# Patient Record
Sex: Female | Born: 1940 | Race: White | Hispanic: No | Marital: Married | Smoking: Never smoker
Health system: Southern US, Community
[De-identification: ages and names within clinical notes are randomized; demographics above are authoritative.]

## PROBLEM LIST (undated history)

## (undated) DIAGNOSIS — E7800 Pure hypercholesterolemia, unspecified: Secondary | ICD-10-CM

## (undated) DIAGNOSIS — C801 Malignant (primary) neoplasm, unspecified: Secondary | ICD-10-CM

## (undated) DIAGNOSIS — E119 Type 2 diabetes mellitus without complications: Secondary | ICD-10-CM

## (undated) DIAGNOSIS — I10 Essential (primary) hypertension: Secondary | ICD-10-CM

## (undated) DIAGNOSIS — N189 Chronic kidney disease, unspecified: Secondary | ICD-10-CM

## (undated) DIAGNOSIS — M109 Gout, unspecified: Secondary | ICD-10-CM

## (undated) HISTORY — PX: ABDOMINAL HYSTERECTOMY: SHX81

## (undated) HISTORY — PX: APPENDECTOMY: SHX54

## (undated) HISTORY — PX: OTHER SURGICAL HISTORY: SHX169

## (undated) HISTORY — PX: PARTIAL COLECTOMY: SHX5273

## (undated) HISTORY — PX: HERNIA REPAIR: SHX51

## (undated) HISTORY — PX: CHOLECYSTECTOMY: SHX55

---

## 1999-02-09 ENCOUNTER — Ambulatory Visit (HOSPITAL_BASED_OUTPATIENT_CLINIC_OR_DEPARTMENT_OTHER): Admission: RE | Admit: 1999-02-09 | Discharge: 1999-02-09 | Payer: Self-pay | Admitting: Orthopedic Surgery

## 2005-07-20 ENCOUNTER — Ambulatory Visit: Payer: Self-pay

## 2006-01-17 ENCOUNTER — Other Ambulatory Visit: Payer: Self-pay

## 2006-01-17 ENCOUNTER — Inpatient Hospital Stay: Payer: Self-pay | Admitting: Internal Medicine

## 2007-08-08 ENCOUNTER — Ambulatory Visit: Payer: Self-pay | Admitting: Family Medicine

## 2007-12-11 ENCOUNTER — Ambulatory Visit: Payer: Self-pay | Admitting: Gynecologic Oncology

## 2008-03-07 ENCOUNTER — Emergency Department: Payer: Self-pay | Admitting: Emergency Medicine

## 2008-03-08 ENCOUNTER — Emergency Department: Payer: Self-pay | Admitting: Emergency Medicine

## 2008-03-09 ENCOUNTER — Emergency Department: Payer: Self-pay | Admitting: Emergency Medicine

## 2008-03-09 ENCOUNTER — Other Ambulatory Visit: Payer: Self-pay

## 2008-09-05 ENCOUNTER — Ambulatory Visit: Payer: Self-pay | Admitting: Gynecologic Oncology

## 2008-09-16 ENCOUNTER — Ambulatory Visit: Payer: Self-pay | Admitting: Gynecologic Oncology

## 2010-08-06 ENCOUNTER — Emergency Department: Payer: Self-pay | Admitting: Internal Medicine

## 2010-08-13 ENCOUNTER — Emergency Department: Payer: Self-pay | Admitting: Emergency Medicine

## 2010-08-14 ENCOUNTER — Emergency Department: Payer: Self-pay | Admitting: Emergency Medicine

## 2010-09-21 ENCOUNTER — Ambulatory Visit: Payer: Self-pay | Admitting: Family Medicine

## 2011-05-08 ENCOUNTER — Emergency Department: Payer: Self-pay | Admitting: Emergency Medicine

## 2011-05-14 ENCOUNTER — Inpatient Hospital Stay: Payer: Self-pay | Admitting: Surgery

## 2011-05-16 LAB — CEA: CEA: 2.6 ng/mL (ref 0.0–4.7)

## 2011-05-23 LAB — PATHOLOGY REPORT

## 2011-07-20 ENCOUNTER — Emergency Department: Payer: Self-pay | Admitting: Unknown Physician Specialty

## 2012-08-22 ENCOUNTER — Ambulatory Visit: Payer: Self-pay | Admitting: Family Medicine

## 2014-08-13 ENCOUNTER — Other Ambulatory Visit: Payer: Self-pay | Admitting: Physician Assistant

## 2014-08-13 ENCOUNTER — Observation Stay: Payer: Self-pay | Admitting: Psychiatry

## 2014-08-13 LAB — COMPREHENSIVE METABOLIC PANEL
ALBUMIN: 2.9 g/dL — AB (ref 3.4–5.0)
ALK PHOS: 86 U/L
ANION GAP: 9 (ref 7–16)
BUN: 17 mg/dL (ref 7–18)
Bilirubin,Total: 0.3 mg/dL (ref 0.2–1.0)
CHLORIDE: 108 mmol/L — AB (ref 98–107)
CREATININE: 1.42 mg/dL — AB (ref 0.60–1.30)
Calcium, Total: 8.6 mg/dL (ref 8.5–10.1)
Co2: 24 mmol/L (ref 21–32)
EGFR (Non-African Amer.): 39 — ABNORMAL LOW
GFR CALC AF AMER: 47 — AB
Glucose: 181 mg/dL — ABNORMAL HIGH (ref 65–99)
Osmolality: 287 (ref 275–301)
POTASSIUM: 3.8 mmol/L (ref 3.5–5.1)
SGOT(AST): 23 U/L (ref 15–37)
SGPT (ALT): 13 U/L — ABNORMAL LOW
Sodium: 141 mmol/L (ref 136–145)
TOTAL PROTEIN: 7.1 g/dL (ref 6.4–8.2)

## 2014-08-13 LAB — CBC
HCT: 37.6 % (ref 35.0–47.0)
HGB: 11.8 g/dL — AB (ref 12.0–16.0)
MCH: 23.8 pg — AB (ref 26.0–34.0)
MCHC: 31.4 g/dL — ABNORMAL LOW (ref 32.0–36.0)
MCV: 76 fL — AB (ref 80–100)
Platelet: 291 10*3/uL (ref 150–440)
RBC: 4.96 10*6/uL (ref 3.80–5.20)
RDW: 15.9 % — ABNORMAL HIGH (ref 11.5–14.5)
WBC: 9.9 10*3/uL (ref 3.6–11.0)

## 2014-08-13 LAB — HEMOGLOBIN A1C: Hemoglobin A1C: 7.7 % — ABNORMAL HIGH (ref 4.2–6.3)

## 2014-08-13 LAB — CK TOTAL AND CKMB (NOT AT ARMC)
CK, TOTAL: 108 U/L (ref 26–192)
CK, Total: 91 U/L (ref 26–192)
CK, Total: 91 U/L (ref 26–192)
CK-MB: 1.2 ng/mL (ref 0.5–3.6)
CK-MB: 1.1 ng/mL (ref 0.5–3.6)
CK-MB: 1.5 ng/mL (ref 0.5–3.6)

## 2014-08-13 LAB — PROTIME-INR
INR: 0.9
PROTHROMBIN TIME: 12.4 s (ref 11.5–14.7)

## 2014-08-13 LAB — TROPONIN I

## 2014-08-13 LAB — APTT: Activated PTT: <23 secs — ABNORMAL LOW (ref 23.6–35.9)

## 2014-08-14 ENCOUNTER — Other Ambulatory Visit: Payer: Self-pay | Admitting: Physician Assistant

## 2014-08-14 DIAGNOSIS — R079 Chest pain, unspecified: Secondary | ICD-10-CM

## 2014-08-14 DIAGNOSIS — I200 Unstable angina: Secondary | ICD-10-CM

## 2014-08-14 DIAGNOSIS — N182 Chronic kidney disease, stage 2 (mild): Secondary | ICD-10-CM

## 2014-08-14 LAB — CBC WITH DIFFERENTIAL/PLATELET
BASOS PCT: 0.9 %
Basophil #: 0.1 10*3/uL (ref 0.0–0.1)
EOS ABS: 0.1 10*3/uL (ref 0.0–0.7)
Eosinophil %: 1.3 %
HCT: 33 % — AB (ref 35.0–47.0)
HGB: 10.3 g/dL — ABNORMAL LOW (ref 12.0–16.0)
Lymphocyte #: 2.8 10*3/uL (ref 1.0–3.6)
Lymphocyte %: 35.7 %
MCH: 24 pg — ABNORMAL LOW (ref 26.0–34.0)
MCHC: 31.3 g/dL — AB (ref 32.0–36.0)
MCV: 77 fL — AB (ref 80–100)
Monocyte #: 0.6 x10 3/mm (ref 0.2–0.9)
Monocyte %: 7.9 %
NEUTROS ABS: 4.3 10*3/uL (ref 1.4–6.5)
Neutrophil %: 54.2 %
PLATELETS: 238 10*3/uL (ref 150–440)
RBC: 4.3 10*6/uL (ref 3.80–5.20)
RDW: 15.9 % — AB (ref 11.5–14.5)
WBC: 7.9 10*3/uL (ref 3.6–11.0)

## 2014-08-14 LAB — HEPARIN LEVEL (UNFRACTIONATED)
Anti-Xa(Unfractionated): 0.28 IU/mL — ABNORMAL LOW (ref 0.30–0.70)
Anti-Xa(Unfractionated): <0.1 IU/mL — ABNORMAL LOW (ref 0.30–0.70)

## 2014-08-14 LAB — COMPREHENSIVE METABOLIC PANEL
ALBUMIN: 2.5 g/dL — AB (ref 3.4–5.0)
Alkaline Phosphatase: 71 U/L
Anion Gap: 7 (ref 7–16)
BUN: 17 mg/dL (ref 7–18)
Bilirubin,Total: 0.2 mg/dL (ref 0.2–1.0)
CALCIUM: 7.9 mg/dL — AB (ref 8.5–10.1)
CHLORIDE: 110 mmol/L — AB (ref 98–107)
CREATININE: 1.26 mg/dL (ref 0.60–1.30)
Co2: 25 mmol/L (ref 21–32)
EGFR (Non-African Amer.): 44 — ABNORMAL LOW
GFR CALC AF AMER: 54 — AB
Glucose: 156 mg/dL — ABNORMAL HIGH (ref 65–99)
OSMOLALITY: 288 (ref 275–301)
Potassium: 3.5 mmol/L (ref 3.5–5.1)
SGOT(AST): 23 U/L (ref 15–37)
SGPT (ALT): 14 U/L
Sodium: 142 mmol/L (ref 136–145)
TOTAL PROTEIN: 6 g/dL — AB (ref 6.4–8.2)

## 2014-08-17 LAB — WOUND AEROBIC CULTURE

## 2014-08-22 ENCOUNTER — Encounter: Payer: Self-pay | Admitting: Cardiovascular Disease

## 2014-12-27 NOTE — Discharge Summary (Signed)
PATIENT NAME:  Carly Cortez, Carly Cortez MR#:  [id number] DATE OF BIRTH:  [birth year]  DATE OF ADMISSION:  08/13/2014 DATE OF DISCHARGE:    PRIMARY CARE PHYSICIAN: Dr. Felipa Evener.   DISCHARGE DIAGNOSES: 1. Chest pain, 2. Angina.  3. Hypertension.  4. Diabetes.  5. Coronary artery disease. 6. Chronic kidney disease.  7. Anemia   CONDITION: Stable.   CODE STATUS: Full code.   HOME MEDICATIONS: Please refer to the medication reconciliation list.   DIET: Low-sodium, low-fat, low-cholesterol, ADA diet.   ACTIVITY: As tolerated.   FOLLOWUP CARE: Follow with PCP and the [facility] cardiologist within 1-2 weeks.   REASON FOR ADMISSION: Chest pain.   HOSPITAL COURSE: The patient is a 74 year old Caucasian female with a history of CAD, cardiomyopathy, hypertension, diabetes, hyperlipidemia, and stable angina, who presented in the ED due to chest pain. For detailed history and physical examination, please refer to the admission note dictated by Dr. Ether Griffins ASSESSMENT AND PLAN: 1. For chest pain and history of coronary artery disease, the patient has been treated with aspirin, Lipitor, and Coreg. The patient's troponin level has been negative for 3 sets. Cardiology consult suggested a stress test today, which is normal. The patient was on heparin drip and according to cardiologist, if the patient's stress test is normal, the patient can be discharged today.  2. For chronic kidney disease, the patient's creatinine decreased to normal range, which is stable.  3. Diabetes. He has been treated with sliding scale and Levemir. Blood sugar is controlled.  4. Hypertension has been controlled with hypertension medication.  5. The patient has no chest pain, the patient's vital signs are stable and physical examination is unremarkable.   CONDITION: She is clinically stable.   DISPOSITION: She will be discharged to home today if Dr. Fletcher Anon agrees.   I discussed the patient's discharge plan with the patient, the patient's  husband, nurse,  and case manager and also, the cardiologist.   TIME SPENT: About 43 minutes.    ____________________________ Demetrios Loll, MD qc:mw D: 08/14/2014 14:21:34 ET T: 08/14/2014 15:03:25 ET JOB#: [id number]  cc: Demetrios Loll, MD, <Dictator> Demetrios Loll MD ELECTRONICALLY SIGNED 08/14/2014 16:06

## 2014-12-27 NOTE — Consult Note (Signed)
General Aspect Primary Cardiologist: [facility] ___________________  74 year old female with history of CAD s/p multiple stenting in 2010 after suffering post op MI, HTN, HLD, vulvar CA, colon CA, lichen sclerosis, spasmodic dysphonia, and CKD stage II who presented to [facility] on 08/13/2014 with return of her intermittent chest pain and and SOB. Troponin negative x 3.  ___________________  PMH: 1. CAD s/p multiple stenting in 2010 after suffering post op MI (proximal/mid LAD & mid/distal RCA) 2. HTN 3. HLD 4. Vulvar CA 5. Colon CA 6. 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   Patient with known history of CAD as above dating back to 2010. She recently underwent a nuclear stress test at [facility] this past summer 2/2 exertional chest pain and SOB that was abnormal prompting her primary cardiologist there to proceed with a cardiac cath. She underwent cath on 05/23/2014 that showed in-stent restenosis of the mRCA of 40-50%, in-stent restenosis of the dRCA of 10-20%, widely patent pLAD and mLAD stents. The study also revealed D1 was a small vessel that was jailed by the pLAD stent. Her abnormal stress test results did not correlate with with the angiographic appearance of  the widely patent  LAD. However, it could be due to the already jailed D1 which appears stable. Medical therapy was recommeded. Follow up echo on 06/04/2014 showed an EF of 60-65%, GR1DD, and mildly dialted left atrium. She was started on Imdur 30 mg daily which did help some with her symptoms. She last followed up with her [facility] cardiologist on 08/07/2014 with the above continued complaints, as well as dizziness. Her Imdur was titraited at that time without further intervention. She has been having to take SL NTG, as well as NTG paste at night several times  per week in plus the Imdur to aid in her symptoms.   She presented to [facility] on 08/13/2014 with intermittent, severe left sided chest pain that radiated towards her back. Pain was initially rated a 9/10 described as sharp and tight. It was associated with diaphoresis, SOB, dizziness, and presyncope. 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 Troponin now negative x 3. She is currently ***   Physical Exam:  GEN well developed, well nourished, no acute distress, obese   HEENT PERRL, hearing intact to voice, moist oral mucosa   NECK supple   RESP normal resp effort  clear BS   CARD Regular rate and rhythm  Normal, S1, S2  No murmur   ABD denies tenderness  no hernia  soft   EXTR negative edema   SKIN normal to palpation   NEURO cranial nerves intact   PSYCH alert, A+O to time, place, person, good insight   Review of Systems:  General: Fatigue  Weakness   Skin: No Complaints   ENT: No Complaints   Eyes: No Complaints   Neck: No Complaints   Respiratory: Short of breath   Cardiovascular: Chest pain or discomfort  Tightness   Gastrointestinal: Nausea   Genitourinary: No Complaints   Vascular: No Complaints   Musculoskeletal: No Complaints   Neurologic: Dizzness   Hematologic: No Complaints   Endocrine: No Complaints   Psychiatric: No Complaints   Review of Systems: All other systems were reviewed  and found to be negative   Medications/Allergies Reviewed Medications/Allergies reviewed   Family & Social History:  Family and Social History:  Family History Hypertension  Diabetes Mellitus   Social History negative tobacco, negative ETOH, negative Illicit drugs   Place of Living Home     Osteoporosis:    Kidney Infection:    Hemorrhoids:    anemia-iron def. anemia:    Hypercholesterolemia:    htn:    diabetes I:    GERD:    ingunal hernia repair:    Hysterectomy -  Partial:    cardiac stents: x3   Knee Surgery:    Bladder Surgery:   Home Medications: Medication Instructions Status  aspirin 81 mg oral tablet 1  orally every other day Active  Lantus Solostar Pen 100 units/mL subcutaneous solution 58 unit(s) subcutaneous once a day Active  Norco 325 mg-5 mg oral tablet 2 tab(s) orally every 4 hours, As Needed - for Pain Active  pantoprazole 40 mg oral delayed release tablet 1 tab(s) orally once a day Active  carvedilol 12.5 mg oral tablet 1 tab(s) orally 2 times a day Active  clobetasol topical 0.05% topical cream Apply topically to affected area once a day Active  Nitrostat 0.4 mg sublingual tablet 1 tab(s) sublingual every 5 minutes, As Needed Active  cetirizine 5 mg oral tablet 2 tab(s) orally once a day Active  Imdur 60 mg oral tablet, extended release 1 tab(s) orally once a day (in the morning) Active   Lab Results:  Hepatic:  10-Dec-15 00:51   Bilirubin, Total 0.2  Alkaline Phosphatase 71 (46-116 NOTE: New Reference Range 03/25/14)  SGPT (ALT) 14 (14-63 NOTE: New Reference Range 03/25/14)  SGOT (AST) 23  Total Protein, Serum  6.0  Albumin, Serum  2.5  Routine Chem:  10-Dec-15 00:51   Glucose, Serum  156  BUN 17  Creatinine (comp) 1.26  Sodium, Serum 142  Potassium, Serum 3.5  Chloride, Serum  110  CO2, Serum 25  Calcium (Total), Serum  7.9  Osmolality (calc) 288  eGFR (African American)  54  eGFR (Non-African American)  44 (eGFR values <38m/min/1.73 m2 may be an indication of chronic kidney disease (CKD). Calculated eGFR, using the MRDR Study equation, is useful in  patients with stable renal function. The eGFR calculation will not be reliable in acutely ill patients when serum creatinine is changing rapidly. It is not useful in patients on dialysis. The eGFR calculation may not be applicable to patients at the low and high extremes of body sizes, pregnant women, and vegetarians.)  Anion Gap 7  Cardiac:  09-Dec-15  11:24   Troponin I < 0.02 (0.00-0.05 0.05 ng/mL or less: NEGATIVE  Repeat testing in 3-6 hrs  if clinically indicated. >0.05 ng/mL: POTENTIAL  MYOCARDIAL INJURY. Repeat  testing in 3-6 hrs if  clinically indicated. NOTE: An increase or decrease  of 30% or more on serial  testing suggests a  clinically important change)    15:31   Troponin I < 0.02 (0.00-0.05 0.05 ng/mL or less: NEGATIVE  Repeat testing in 3-6 hrs  if clinically indicated. >0.05 ng/mL: POTENTIAL  MYOCARDIAL INJURY. Repeat  testing in 3-6 hrs if  clinically indicated. NOTE: An increase or decrease  of 30% or more on serial  testing suggests a  clinically important change)    20:11   Troponin I < 0.02 (0.00-0.05 0.05 ng/mL or less: NEGATIVE  Repeat testing in 3-6 hrs  if clinically indicated. >0.05 ng/mL: POTENTIAL  MYOCARDIAL INJURY.  Repeat  testing in 3-6 hrs if  clinically indicated. NOTE: An increase or decrease  of 30% or more on serial  testing suggests a  clinically important change)  Routine Hem:  10-Dec-15 00:51   WBC (CBC) 7.9  RBC (CBC) 4.30  Hemoglobin (CBC)  10.3  Hematocrit (CBC)  33.0  Platelet Count (CBC) 238  MCV  77  MCH  24.0  MCHC  31.3  RDW  15.9  Neutrophil % 54.2  Lymphocyte % 35.7  Monocyte % 7.9  Eosinophil % 1.3  Basophil % 0.9  Neutrophil # 4.3  Lymphocyte # 2.8  Monocyte # 0.6  Eosinophil # 0.1  Basophil # 0.1 (Result(s) reported on 14 Aug 2014 at 01:23AM.)   EKG:  EKG Interp. by me   Interpretation NSR, incomplete LBBB, inferolateral st depression   Radiology Results: XRay:    09-Dec-15 12:38, Chest Portable Single View  Chest Portable Single View   REASON FOR EXAM:    Chest Pain  COMMENTS:       PROCEDURE: DXR - DXR PORTABLE CHEST SINGLE VIEW  - Aug 13 2014 12:38PM     CLINICAL DATA:  Chest pain.  History of hypertension, diabetes.    EXAM:  PORTABLE CHEST - 1 VIEW    COMPARISON:  03/09/2008    FINDINGS:  Heart is normal size. Mediastinal  contours are within normal limits.  Minimal bibasilar atelectasis. No effusions. No acute bony  abnormality.     IMPRESSION:  Bibasilar atelectasis.      Electronically Signed    By: Rolm Baptise M.D.    On: 08/13/2014 13:10         Verified By: Raelyn Number, M.D.,    Daypro: Hives  Codeine: None  Cipro: None  Lodine: Unknown  Colchicine: Anaphylaxis  Vital Signs/Nurse's Notes: **Vital Signs.:   10-Dec-15 06:16  Vital Signs Type Routine  Temperature Temperature (F) 98.1  Celsius 36.7  Temperature Source oral  Pulse Pulse 75  Respirations Respirations 20  Systolic BP Systolic BP 545  Diastolic BP (mmHg) Diastolic BP (mmHg) 80  Mean BP 104  Pulse Ox % Pulse Ox % 92  Pulse Ox Activity Level  At rest  Oxygen Delivery Room Air/ 21 %    Impression 74 year old female with history of CAD s/p multiple stenting in 2010 after suffering post op MI, HTN, HLD, vulvar CA, colon CA, lichen sclerosis, spasmodic dysphonia, and CKD stage II who presented to [facility] on 08/13/2014 with return of her intermittent chest pain and and SOB. Troponin negative x 3. She was admitted with unstable angina.   1. Unstable angina: -Troponin negative x 3 -EKG with known incomplete LBBB and new inferolateral st depression -Lexiscan Myoview to evaluate for high risk ischemia -Should she require cardiac cath, she needs steroids pre-cath -Last EF 60-65% on 06/04/2014 -Heparin gtt -Aspirin 81 mg -Coreg 12.5 mg bid -Imdur 60 mg   2. CKD stage II: -SCr is improved quite a bit from years prior when there was discussion of possible HD -Monitor  3. IDDM: -Per IM  4. HTN: -Controlled -Continue medications as above  5. Anemia: -Per IM   Electronic Signatures for Addendum Section:  Kathlyn Sacramento (MD) (Signed Addendum 10-Dec-15 16:24)  The patient was seen and examined. Agree with the above. She has chronic chest pain. Recent cath at [facility] in September showed patent LAD stent with mild restenosis. No  other obstructive disease. She presented with prolonged episode of chest pain. Negative  TnI and no ischemic ECG changes. Lexiscan Myoview was normal.  No further work up is recommended. ok to discharge home. Follow up with cardiologist in few weeks.   Electronic Signatures: Kathlyn Sacramento (MD)  (Signed 10-Dec-15 16:24)  Co-Signer: General Aspect/Present Illness, Family & Social History, Past Medical History, Home Medications, Labs, Radiology, Allergies, Vital Signs/Nurse's Notes Christell Faith M (PA-C)  (Signed 10-Dec-15 09:22)  Authored: General Aspect/Present Illness, History and Physical Exam, Review of System, Family & Social History, Past Medical History, Home Medications, Labs, EKG , Radiology, Allergies, Vital Signs/Nurse's Notes, Impression/Plan   Last Updated: 10-Dec-15 16:24 by Kathlyn Sacramento (MD)

## 2014-12-31 NOTE — H&P (Signed)
PATIENT NAME:  Carly Cortez, Carly Cortez MR#:  [id number] DATE OF BIRTH:  27-May-1941  DATE OF ADMISSION:  08/13/2014  PRIMARY CARE PHYSICIAN: Chaney Malling, MD   HISTORY OF PRESENT ILLNESS: The patient is a 74 year old Caucasian female with a past medical history significant for a history of coronary artery disease, status post myocardial infarction in 2010, history of cardiomyopathy with a known ejection fraction, diabetes, hypertension, hyperlipidemia, and also stable angina,  who presents to the hospital with complaints of chest pain. Per the patient, she has been having stable angina for a while now. She recently a cardiac catheterization done by Dr. Sabra Heck in September 2015. She was told that she had in-stent stenosis of approximately 40%; however, no new stents were placed, and the patient was discharged on advanced doses of Imdur. Today, however, she started having severe left-sided chest pain. Pain was described as sharp, tightness, and very intense pain, 9 out of 10 by intensity. It was intermittent and initially lasted for an hour and 15 minutes. It was radiating to the back, accompanied by lightheadedness as well as dizziness, also feeling like room was spinning around and feeling presyncopal. Also, she broke into a sweat and was very short of breath. There was no significant change in pain with deep breathing. The patient, however, was not able to walk. She sat down and even laid down, and her pain eased off. However, total pain lasted approximately 45 minutes. When EMS arrived, she was brought to Emergency Room for further evaluation.   In the Emergency Room, her EKG performed did not show any significant changes, except for LVH, as well as incomplete left bundle branch block. Unfortunately no old EKGs are available to compare with. Her cardiac enzymes were also negative on her first testing. Hospital services were contacted for admission.   PAST MEDICAL HISTORY: Significant for history of chronic renal  failure. Apparently, the patient was in acute renal failure in the past, almost required hemodialysis. Now, she has been followed by a nephrologist intermittently, with labs every 6 months. History of coronary artery disease, status post MI in 2010, status post cardiac catheterization in September 2015 by Dr. Sabra Heck at [facility], where she was told that she has a 40% in-stent stenosis, but no stents were placed. Also, a history of cardiomyopathy, according to the patient, with unknown ejection fraction, diabetes mellitus type 2 insulin-dependent, hypertension, hyperlipidemia, gastroesophageal reflux disease, appendectomy, partial colectomy due to colon cancer, gallbladder surgery, 2 hernia repairs, vulvar precancerous condition.   ALLERGIES: MULTIPLE, INCLUDING COLCHICINE, DAYPRO, CIPROFLOXACIN, CODEINE AS WELL AS LODINE.   FAMILY HISTORY: The patient's mother had hypertension as well as diabetes mellitus. The patient's maternal grandmother had kidney disease.   SOCIAL HISTORY: The patient is married and has 3 children who live close by. She lives with her husband. No smoking. No alcohol abuse. She used to work in a [employer].   REVIEW OF SYSTEMS:  CONSTITUTIONAL: Positive for chest pains, arrhythmias, feeling presyncopal, and intermittent diarrhea which seems to be chronic since her gallbladder surgery. Also intermittent dyspnea on exertion, intermittent chest pains, admits of watery eyes intermittently, and frequency of urination. She has to urinate 2 or 3 times at night. Denies any fevers, chills, fatigue, weakness, weight loss or gain.  EYES: Denies any blurry vision, double vision, glaucoma, or cataracts.  EARS, NOSE, AND THROAT: Denies any tinnitus, allergies, epistaxis, sinus pain, dentures, or difficulty swallowing. RESPIRATORY: Denies any cough, wheezes, asthma, or COPD.  CARDIOVASCULAR: Denies any orthopnea, edema,  or palpitations.  GASTROINTESTINAL: Denies any nausea, vomiting,  hematemesis, rectal bleeding, change in bowel habits.  GENITOURINARY: Denies dysuria, hematuria, incontinence.  ENDOCRINE: Denies any polydipsia, nocturia, thyroid problems, heat or cold intolerance or thirst.  HEMATOLOGIC: Denies any anemia, easy bruising or bleeding, swollen glands.  SKIN: Denies any acne, rashes, or change in moles.  MUSCULOSKELETAL: Denies arthritis, cramps, swelling.  NEUROLOGIC: Denies numbness, epilepsy or tremor.  PSYCHIATRIC: Denies anxiety, insomnia, depression.   PHYSICAL EXAMINATION:  VITAL SIGNS: On arrival to the hospital, the patient's temperature was 98, pulse was 76, respiration was 18, blood pressure 126/90, saturation 98% on room air.  GENERAL: This is a well-developed, well-nourished, obese, pale Caucasian female sitting on the stretcher.  HEENT: Her pupils are equal and reactive to light.  Extraocular muscles are intact. No icterus or conjunctivitis. Has normal hearing. No pharyngeal erythema. Mucosa is dry.  NECK: No masses. Supple, nontender. Thyroid not enlarged. No adenopathy. No JVD or carotid bruits bilaterally. Full range of motion.  LUNGS: The patient does have somewhat diminished breath sounds noted, but no wheezing. No labored inspirations, increased effort, dullness to percussion or overt respiratory distress.  CARDIOVASCULAR: S1, S2 appreciated. Rhythm was regular. PMI not lateralized. Chest is nontender to palpation.  Pedal pulses 1+. No lower extremity edema, calf tenderness, or cyanosis was noted.  ABDOMEN: Soft, nontender. No significant hepatosplenomegaly or masses were noted. The patient does have some midline below the umbilical area wounds, which had not healed yet; ulcerations.  RECTAL: Deferred.  MUSCLE STRENGTH: Able to move all extremities. No cyanosis, degenerative joint disease or kyphosis. Gait not tested.  SKIN: Did not reveal any rashes, lesions, erythema, nodularity, or induration. It was warm and dry to palpation. The patient  does have  healing wound/healing ulcer in the abdominal wall area.  NEUROLOGIC: Cranial nerves grossly intact. Sensory is intact. No dysarthria or aphasia. The patient is alert, oriented to time, person and place, cooperative. Memory is good. No signs of confusion, agitation, or depression were noted.   LABORATORY DATA: BMP showed a glucose of 181, creatinine 1.42; otherwise BMP was unremarkable. Albumin level was 2.9; otherwise liver enzymes were unremarkable. Cardiac enzymes were negative. CBC: White blood cell count was 9.9, hemoglobin 11.8, platelet count was 291,000 with low MCV of 76. Coagulation Panel: Pro-time 12.4, INR 0.9, and activated PTT was less than 23.0.   ELECTROCARDIOGRAM: Showed normal sinus rhythm at 78 beats per minute, incomplete left bundle branch block, LVH with repolarization abnormalities, but no acute ST-T changes were noted. Q-wave in lead III was noted. Nonspecific ST-T changes. ST depressions in her lateral leads.   CHEST X-RAY: A portable single view, 08/13/2014, revealed the previous atelectasis.   ASSESSMENT AND PLAN:  1.  Unstable angina. Admit the patient to the medical floor. Continue her on Coreg, aspirin, heparin, nitroglycerin as well as morphine as needed. Will get cardiology consultation done and likely a stress test in the morning.  2.  Diabetes mellitus type 2 with nephropathy. We will continue Lantus at lower doses. While she is in the hospital, continue sliding scale insulin and get hemoglobin A1c checked.  3.  Hypertension. We will continue her medications. Her blood pressure seemed to be well controlled.  4.  Renal failure, chronic. Will start the patient on low rate IV fluids as the patient seemed to be dehydrated clinically. We will follow the patient's creatinine in the morning.  5.  Anemia. Check guaiac. The patient has low MCV.   TIME SPENT: 50  minutes.    ____________________________ Theodoro Grist, MD rv:MT D: 08/13/2014 15:19:42  ET T: 08/13/2014 16:13:42 ET JOB#: [id number]  cc: Theodoro Grist, MD, <Dictator> Dr. Chaney Malling Ankith Edmonston MD ELECTRONICALLY SIGNED 09/16/2014 18:14

## 2015-02-11 ENCOUNTER — Encounter: Payer: Self-pay | Admitting: *Deleted

## 2015-02-11 ENCOUNTER — Emergency Department
Admission: EM | Admit: 2015-02-11 | Discharge: 2015-02-11 | Disposition: A | Discharge status: Home / Self Care | Payer: Medicare Other | Attending: Emergency Medicine | Admitting: Emergency Medicine

## 2015-02-11 DIAGNOSIS — R040 Epistaxis: Secondary | ICD-10-CM | POA: Diagnosis not present

## 2015-02-11 DIAGNOSIS — E119 Type 2 diabetes mellitus without complications: Secondary | ICD-10-CM | POA: Insufficient documentation

## 2015-02-11 DIAGNOSIS — I10 Essential (primary) hypertension: Secondary | ICD-10-CM | POA: Diagnosis not present

## 2015-02-11 HISTORY — DX: Type 2 diabetes mellitus without complications: E11.9

## 2015-02-11 HISTORY — DX: Pure hypercholesterolemia, unspecified: E78.00

## 2015-02-11 HISTORY — DX: Essential (primary) hypertension: I10

## 2015-02-11 HISTORY — DX: Malignant (primary) neoplasm, unspecified: C80.1

## 2015-02-11 LAB — GLUCOSE, CAPILLARY: GLUCOSE-CAPILLARY: 152 mg/dL — AB (ref 65–99)

## 2015-02-11 MED ORDER — CARVEDILOL 25 MG PO TABS
ORAL_TABLET | ORAL | Status: AC
  Administered 2015-02-11: 12.5 mg via ORAL
  Filled 2015-02-11: qty 1

## 2015-02-11 MED ORDER — OXYMETAZOLINE HCL 0.05 % NA SOLN
1.0000 | Freq: Once | NASAL | Status: AC
  Administered 2015-02-11: 1 via NASAL

## 2015-02-11 MED ORDER — CARVEDILOL 12.5 MG PO TABS
12.5000 mg | ORAL_TABLET | Freq: Two times a day (BID) | ORAL | Status: DC
  Administered 2015-02-11: 12.5 mg via ORAL

## 2015-02-11 MED ORDER — OXYMETAZOLINE HCL 0.05 % NA SOLN
NASAL | Status: AC
  Administered 2015-02-11: 1 via NASAL
  Filled 2015-02-11: qty 15

## 2015-02-11 NOTE — Discharge Instructions (Signed)
Return to the emergency department for any new or worsening bleeding including any dizziness, chest pain, passing out, or bleeding that does not stop with clamp pressure for 10 minutes.  Nosebleed A nosebleed can be caused by many things, including:  Getting hit hard in the nose.  Infections.  Dry nose.  Colds.  Medicines. Your doctor may do lab testing if you get nosebleeds a lot and the cause is not known. HOME CARE   If your nose was packed with material, keep it there until your doctor takes it out. Put the pack back in your nose if the pack falls out.  Do not blow your nose for 12 hours after the nosebleed.  Sit up and bend forward if your nose starts bleeding again. Pinch the front half of your nose nonstop for 20 minutes.  Put petroleum jelly inside your nose every morning if you have a dry nose.  Use a humidifier to make the air less dry.  Do not take aspirin.  Try not to strain, lift, or bend at the waist for many days after the nosebleed. GET HELP RIGHT AWAY IF:   Nosebleeds keep happening and are hard to stop or control.  You have bleeding or bruises that are not normal on other parts of the body.  You have a fever.  The nosebleeds get worse.  You get lightheaded, feel faint, sweaty, or throw up (vomit) blood. MAKE SURE YOU:   Understand these instructions.  Will watch your condition.  Will get help right away if you are not doing well or get worse. Document Released: 05/31/2008 Document Revised: 11/14/2011 Document Reviewed: 05/31/2008 [facility] Patient Information 2015 [place], [place]. This information is not intended to replace advice given to you by your health care provider. Make sure you discuss any questions you have with your health care provider.

## 2015-02-11 NOTE — ED Notes (Signed)
Pt states she had a nosebleed this morning for about 45 minutes that subsides. The nosebleed started again around 7pm tonight (right nare only). Pt with small amount of bleeding right nare in triage, clamp placed

## 2015-02-11 NOTE — ED Provider Notes (Signed)
[facility] Emergency Department Provider Note   ____________________________________________  Time seen: 9:45 PM I have reviewed the triage vital signs and the triage nursing note.  HISTORY  Chief Complaint Epistaxis   Historian Patient  HPI Carly Cortez is a 74 y.o. female is having a nosebleed from the right naris. She woke up this morning without 45 minutes of nosebleed. The husband does not feel like it was much blood loss in total. This evening it did start again and so they came in for evaluation. This afternoon the daughter had made an appointment with the ENT Dr. Richardson Landry for Friday. Patient has had nosebleed in the past however at that point in time she was on Plavix, and she is not on that anymore. She does have high blood pressure. She has not taken her evening dose of carvedilol yet and she is not sure the dose. Symptoms are moderate. She did have some dizziness upon arrival but no syncope. There's been no chest pain or shortness of breath.   Past Medical History  Diagnosis Date  . Diabetes mellitus without complication   . Hypertension   . High cholesterol   . Cancer     There are no active problems to display for this patient.   Past Surgical History  Procedure Laterality Date  . Cardiac stents    . Appendectomy    . Cholecystectomy    . Partial colectomy    . Hernia repair      No current outpatient prescriptions on file.  Allergies Review of patient's allergies indicates not on file.  No family history on file.  Social History History  Substance Use Topics  . Smoking status: Never Smoker   . Smokeless tobacco: Not on file  . Alcohol Use: No    Review of Systems  Constitutional: Negative for fever. Eyes: Negative for visual changes. ENT: Negative for sore throat. Cardiovascular: Negative for chest pain. Respiratory: Negative for shortness of breath. Gastrointestinal: Negative for abdominal pain, vomiting and  diarrhea. Genitourinary: Negative for dysuria. Musculoskeletal: Negative for back pain. Skin: Negative for rash. Neurological: Negative for headaches, focal weakness or numbness.  ____________________________________________   PHYSICAL EXAM:  VITAL SIGNS: ED Triage Vitals  Enc Vitals Group     BP 02/11/15 2016 163/88 mmHg     Pulse Rate 02/11/15 2016 86     Resp 02/11/15 2121 16     Temp 02/11/15 2016 97.9 F (36.6 C)     Temp Source 02/11/15 2016 Oral     SpO2 02/11/15 2016 94 %     Weight 02/11/15 2016 204 lb (92.534 kg)     Height 02/11/15 2016 5\' 6"  (1.676 m)     Head Cir --      Peak Flow --      Pain Score 02/11/15 2121 0     Pain Loc --      Pain Edu? --      Excl. in [place]? --      Constitutional: Alert and oriented. Well appearing and in no distress. Eyes: Conjunctivae are normal. PERRL. Normal extraocular movements. ENT   Head: Normocephalic and atraumatic.   Nose: Small amount of dried blood at the right nares entrance. Hyperemia of all mucosa of the right narrow with no focal blood clot or scab seen. No active bleeding. Left nasal mucosa also hyperemic.   Mouth/Throat: Mucous membranes are moist.   Neck: No stridor. Cardiovascular: Normal rate, regular rhythm.  No murmurs, rubs, or gallops.  Respiratory: Normal respiratory effort without tachypnea nor retractions. Breath sounds are clear and equal bilaterally. No wheezes/rales/rhonchi. Gastrointestinal:  Genitourinary/rectal: Musculoskeletal: Nontender with normal range of motion in all extremities. No joint effusions.  No lower extremity tenderness nor edema. Neurologic:  Normal speech and language. No gross focal neurologic deficits are appreciated. Skin:  Skin is warm, dry and intact. No rash noted. Psychiatric: Mood and affect are normal. Speech and behavior are normal. Patient exhibits appropriate insight and judgment.  ____________________________________________    EKG  None ____________________________________________  LABS (pertinent positives/negatives)  None  ____________________________________________  RADIOLOGY Radiologist results reviewed  None __________________________________________  PROCEDURES  Procedure(s) performed: None Critical Care performed: None  ____________________________________________   ED COURSE / ASSESSMENT AND PLAN  Pertinent labs & imaging results that were available during my care of the patient were reviewed by me and considered in my medical decision making (see chart for details).  After pressure was applied to the nose, visualization was undertaken with an otoscope and there is no active bleeding, no visualized clots or area to be cauterized. I did instill Afrin 1 spray to both sides of the naris. In terms of her dizziness, there is no hypotension. In fact her blood pressure slightly elevated and she was given her evening time dose of carvedilol. We discussed whether or not to check blood counts with respect to anemia, and the family did not feel like there is much blood lost and chose not to do blood work this evening. Patient is no longer feeling dizzy after the acute bleeding was stopped. She has an appointment made with Dr. Richardson Landry on Friday.   Patient / Family / Caregiver informed of clinical course, understand medical decision-making process, and agree with plan.    ___________________________________________   FINAL CLINICAL IMPRESSION(S) / ED DIAGNOSES   Final diagnoses:  Epistaxis, recurrent      Lisa Roca, MD 02/11/15 2222

## 2015-05-14 ENCOUNTER — Ambulatory Visit: Payer: Medicare Other

## 2015-05-14 ENCOUNTER — Encounter: Payer: Self-pay | Admitting: Emergency Medicine

## 2015-05-14 ENCOUNTER — Ambulatory Visit
Admission: EM | Admit: 2015-05-14 | Discharge: 2015-05-14 | Disposition: A | Discharge status: Home / Self Care | Payer: Medicare Other | Attending: Family Medicine | Admitting: Family Medicine

## 2015-05-14 DIAGNOSIS — J40 Bronchitis, not specified as acute or chronic: Secondary | ICD-10-CM | POA: Diagnosis not present

## 2015-05-14 DIAGNOSIS — J0100 Acute maxillary sinusitis, unspecified: Secondary | ICD-10-CM | POA: Diagnosis not present

## 2015-05-14 HISTORY — DX: Gout, unspecified: M10.9

## 2015-05-14 HISTORY — DX: Chronic kidney disease, unspecified: N18.9

## 2015-05-14 MED ORDER — AZITHROMYCIN 250 MG PO TABS: ORAL_TABLET | ORAL | Status: DC

## 2015-05-14 MED ORDER — BENZONATATE 100 MG PO CAPS: 100.0000 mg | ORAL_CAPSULE | Freq: Three times a day (TID) | ORAL | Status: DC | PRN

## 2015-05-14 NOTE — ED Provider Notes (Signed)
[facility] Emergency Department Provider Note  ____________________________________________  Time seen: Approximately 8:49 AM  I have reviewed the triage vital signs and the nursing notes.   HISTORY  Chief Complaint Cough   HPI Carly Cortez is a 74 y.o. female  presents with complaints of runny nose, cough and congestion 2 weeks. Patient reports that started off with a runny nose and then gradually progressed. Reports last few days with increased nasal drainage throughout day and especially at night. States cough is dry and unable to cough up phlegm. Denies chest pain or shortness of breath. Denies wheezing. Reports continues to eat and drink well. States felt warm a few days ago but denies known fever. Denies pain.  Reports husband with similar symptoms. States granddaughter's boyfriend with similar symptoms prior to onset of patient's symptoms.  Denies chest pain, shortness of breath, abdominal pain, neck pain, back pain, decreased appetite or intake. Reports feels that she has a sinus infection.   Past Medical History  Diagnosis Date  . Diabetes mellitus without complication   . Hypertension   . High cholesterol   . Cancer   . Chronic kidney disease   . Gout     There are no active problems to display for this patient.   Past Surgical History  Procedure Laterality Date  . Cardiac stents    . Appendectomy    . Cholecystectomy    . Partial colectomy    . Hernia repair    . Abdominal hysterectomy      Current Outpatient Rx  Name  Route  Sig  Dispense  Refill  . aspirin 81 MG tablet   Oral   Take 81 mg by mouth daily.         . carvedilol (COREG) 25 MG tablet   Oral   Take 25 mg by mouth 2 (two) times daily with a meal.         . clobetasol cream (TEMOVATE) 0.05 %   Topical   Apply 1 application topically daily.         . clobetasol ointment (TEMOVATE) 0.05 %   Topical   Apply 1 application topically 2 (two) times daily.          [name] doxepin (SINEQUAN) 25 MG capsule   Oral   Take 25 mg by mouth at bedtime.         . insulin glargine (LANTUS) 100 UNIT/ML injection   Subcutaneous   Inject 60 Units into the skin every morning.         . insulin glargine (LANTUS) 100 UNIT/ML injection   Subcutaneous   Inject 32 Units into the skin at bedtime.         . isosorbide mononitrate (IMDUR) 120 MG 24 hr tablet   Oral   Take 120 mg by mouth daily.         [name] lisinopril (PRINIVIL,ZESTRIL) 40 MG tablet   Oral   Take 40 mg by mouth daily.         . nitroGLYCERIN (NITROSTAT) 0.4 MG SL tablet   Sublingual   Place 0.4 mg under the tongue every 5 (five) minutes as needed for chest pain.         . pantoprazole (PROTONIX) 40 MG tablet   Oral   Take 40 mg by mouth daily.         . simvastatin (ZOCOR) 40 MG tablet   Oral   Take 40 mg by mouth daily.  Allergies Contrast media; Colchicine; Etodolac; Indomethacin; and Oxaprozin  History reviewed. No pertinent family history.  Social History Social History  Substance Use Topics  . Smoking status: Never Smoker   . Smokeless tobacco: None  . Alcohol Use: No    Review of Systems Constitutional: Reports felt warm, but denies known fever.  Eyes: No visual changes. ENT: Reports runny nose, nasal congestion, intermittent sore throat.  Cardiovascular: Denies chest pain. Respiratory: Denies shortness of breath. Reports dry cough.  Gastrointestinal: No abdominal pain.  No nausea, no vomiting.  No diarrhea.  No constipation. Genitourinary: Negative for dysuria. Musculoskeletal: Negative for back pain. Skin: Negative for rash. Neurological: Negative for headaches, focal weakness or numbness.  10-point ROS otherwise negative.  ____________________________________________   PHYSICAL EXAM:  VITAL SIGNS: ED Triage Vitals  Enc Vitals Group     BP 05/14/15 0818 107/63 mmHg     Pulse Rate 05/14/15 0818 80     Resp 05/14/15 0818 16      Temp 05/14/15 0818 96.9 F (36.1 C)     Temp Source 05/14/15 0818 Tympanic     SpO2 05/14/15 0818 98 %     Weight 05/14/15 0818 200 lb (90.719 kg)     Height 05/14/15 0818 5\' 6"  (1.676 m)     Head Cir --      Peak Flow --      Pain Score --      Pain Loc --      Pain Edu? --      Excl. in [place]? --     Constitutional: Alert and oriented. Well appearing and in no acute distress. Eyes: Conjunctivae are normal. PERRL. EOMI. Head: Atraumatic. Mild maxillary sinus TTP, no frontal sinus TTP.    Ears: no erythema, normal TMs bilaterally.   Nose: nasal congestion, bilateral nasal turbinate edema, bilateral nares patent.   Mouth/Throat: Mucous membranes are moist.  Oropharynx non-erythematous. Neck: No stridor.  No cervical spine tenderness to palpation. Hematological/Lymphatic/Immunilogical: No cervical lymphadenopathy. Cardiovascular: Normal rate, regular rhythm. Grossly normal heart sounds.  Good peripheral circulation. Respiratory: Normal respiratory effort.  No retractions. Lungs CTAB. Dry intermittent cough in room.  Gastrointestinal: Soft and nontender. No distention. Normal Bowel sounds.  No abdominal bruits. No CVA tenderness. Musculoskeletal: No lower or upper extremity tenderness nor edema.  No joint effusions. Bilateral pedal pulses equal and easily palpated.  Neurologic:  Normal speech and language. No gross focal neurologic deficits are appreciated. No gait instability. Skin:  Skin is warm, dry and intact. No rash noted. Psychiatric: Mood and affect are normal. Speech and behavior are normal.  ____________________________________________   LABS (all labs ordered are listed, but only abnormal results are displayed)  Labs Reviewed - No data to display ____________________________________________  RADIOLOGY  EXAM: CHEST 2 VIEW  COMPARISON: August 13, 2014.  FINDINGS: The heart size and mediastinal contours are within normal limits. Both lungs are clear. No  pneumothorax or pleural effusion is noted. Moderate hiatal hernia is noted. The visualized skeletal structures are unremarkable.  IMPRESSION: Moderate size hiatal hernia. No acute cardiopulmonary abnormality seen.   Electronically Signed By: Marijo Conception, M.D. On: 05/14/2015 09:38      I, Marylene Land, personally viewed and evaluated these images (plain radiographs) as part of my medical decision making.   ____________________________________________  INITIAL IMPRESSION / ASSESSMENT AND PLAN / ED COURSE  Pertinent labs & imaging results that were available during my care of the patient were reviewed by me and considered in my medical  decision making (see chart for details).  Well appearing. No acute distress. Presents for runny nose, congestion, post nasal drainage and intermittent cough x 2 weeks, husband with similar. Denies chest pain, shortness of breath, decreased oral intake, weakness, abdominal pain or other complaints.   Chest x-ray negative for acute cardiopulmonary abnormality. Moderate-sized hiatal hernia. Patient reports known hiatal hernia. Will treat patient with sinusitis and bronchitis with oral azithromycin and Tessalon Perles as needed for cough. Discussed strict follow-up with primary care physician next week. Discussed strict follow-up and return parameters. Patient verbalized understanding and agreed to plan. ____________________________________________   FINAL CLINICAL IMPRESSION(S) / ED DIAGNOSES  Final diagnoses:  Acute maxillary sinusitis, recurrence not specified  Bronchitis       Marylene Land, NP 05/14/15 [phone]

## 2015-05-14 NOTE — Discharge Instructions (Signed)
Take medication as prescribed. Rest. Eat and drink regularly.   Follow up with your primary care physician next week. Return to Urgent care for new or worsening concerns.   Sinusitis Sinusitis is redness, soreness, and puffiness (inflammation) of the air pockets in the bones of your face (sinuses). The redness, soreness, and puffiness can cause air and mucus to get trapped in your sinuses. This can allow germs to grow and cause an infection.  HOME CARE   Drink enough fluids to keep your pee (urine) clear or pale yellow.  Use a humidifier in your home.  Run a hot shower to create steam in the bathroom. Sit in the bathroom with the door closed. Breathe in the steam 3-4 times a day.  Put a warm, moist washcloth on your face 3-4 times a day, or as told by your doctor.  Use salt water sprays (saline sprays) to wet the thick fluid in your nose. This can help the sinuses drain.  Only take medicine as told by your doctor. GET HELP RIGHT AWAY IF:   Your pain gets worse.  You have very bad headaches.  You are sick to your stomach (nauseous).  You throw up (vomit).  You are very sleepy (drowsy) all the time.  Your face is puffy (swollen).  Your vision changes.  You have a stiff neck.  You have trouble breathing. MAKE SURE YOU:   Understand these instructions.  Will watch your condition.  Will get help right away if you are not doing well or get worse. Document Released: 02/08/2008 Document Revised: 05/16/2012 Document Reviewed: 03/27/2012 [facility] Patient Information 2015 [place], [place]. This information is not intended to replace advice given to you by your health care provider. Make sure you discuss any questions you have with your health care provider.

## 2015-05-14 NOTE — ED Notes (Signed)
Patient c/o cough and chest congestion for 2 weeks.   

## 2016-06-18 ENCOUNTER — Emergency Department
Admission: EM | Admit: 2016-06-18 | Discharge: 2016-06-18 | Disposition: A | Discharge status: Home / Self Care | Payer: Medicare Other | Attending: Emergency Medicine | Admitting: Emergency Medicine

## 2016-06-18 ENCOUNTER — Emergency Department: Payer: Medicare Other

## 2016-06-18 ENCOUNTER — Encounter: Payer: Self-pay | Admitting: Emergency Medicine

## 2016-06-18 DIAGNOSIS — R55 Syncope and collapse: Secondary | ICD-10-CM | POA: Diagnosis present

## 2016-06-18 DIAGNOSIS — Z7982 Long term (current) use of aspirin: Secondary | ICD-10-CM | POA: Diagnosis not present

## 2016-06-18 DIAGNOSIS — E1122 Type 2 diabetes mellitus with diabetic chronic kidney disease: Secondary | ICD-10-CM | POA: Diagnosis not present

## 2016-06-18 DIAGNOSIS — N189 Chronic kidney disease, unspecified: Secondary | ICD-10-CM | POA: Diagnosis not present

## 2016-06-18 DIAGNOSIS — I129 Hypertensive chronic kidney disease with stage 1 through stage 4 chronic kidney disease, or unspecified chronic kidney disease: Secondary | ICD-10-CM | POA: Diagnosis not present

## 2016-06-18 DIAGNOSIS — Z794 Long term (current) use of insulin: Secondary | ICD-10-CM | POA: Diagnosis not present

## 2016-06-18 DIAGNOSIS — Z79899 Other long term (current) drug therapy: Secondary | ICD-10-CM | POA: Insufficient documentation

## 2016-06-18 LAB — COMPREHENSIVE METABOLIC PANEL WITH GFR
ALT: 9 U/L — ABNORMAL LOW (ref 14–54)
AST: 21 U/L (ref 15–41)
Albumin: 3.6 g/dL (ref 3.5–5.0)
Alkaline Phosphatase: 62 U/L (ref 38–126)
Anion gap: 8 (ref 5–15)
BUN: 15 mg/dL (ref 6–20)
CO2: 21 mmol/L — ABNORMAL LOW (ref 22–32)
Calcium: 8.7 mg/dL — ABNORMAL LOW (ref 8.9–10.3)
Chloride: 106 mmol/L (ref 101–111)
Creatinine, Ser: 1.31 mg/dL — ABNORMAL HIGH (ref 0.44–1.00)
GFR calc Af Amer: 45 mL/min — ABNORMAL LOW
GFR calc non Af Amer: 39 mL/min — ABNORMAL LOW
Glucose, Bld: 300 mg/dL — ABNORMAL HIGH (ref 65–99)
Potassium: 4 mmol/L (ref 3.5–5.1)
Sodium: 135 mmol/L (ref 135–145)
Total Bilirubin: 0.6 mg/dL (ref 0.3–1.2)
Total Protein: 7.1 g/dL (ref 6.5–8.1)

## 2016-06-18 LAB — CBC WITH DIFFERENTIAL/PLATELET
BASOS PCT: 1 %
Basophils Absolute: 0.1 10*3/uL (ref 0–0.1)
Eosinophils Absolute: 0.1 10*3/uL (ref 0–0.7)
Eosinophils Relative: 1 %
HEMATOCRIT: 34.5 % — AB (ref 35.0–47.0)
Hemoglobin: 11.3 g/dL — ABNORMAL LOW (ref 12.0–16.0)
LYMPHS ABS: 1.9 10*3/uL (ref 1.0–3.6)
Lymphocytes Relative: 18 %
MCH: 22.6 pg — AB (ref 26.0–34.0)
MCHC: 32.7 g/dL (ref 32.0–36.0)
MCV: 69.2 fL — AB (ref 80.0–100.0)
MONO ABS: 0.7 10*3/uL (ref 0.2–0.9)
MONOS PCT: 7 %
NEUTROS ABS: 7.9 10*3/uL — AB (ref 1.4–6.5)
Neutrophils Relative %: 73 %
Platelets: 291 10*3/uL (ref 150–440)
RBC: 4.99 MIL/uL (ref 3.80–5.20)
RDW: 17.6 % — AB (ref 11.5–14.5)
WBC: 10.7 10*3/uL (ref 3.6–11.0)

## 2016-06-18 LAB — URINALYSIS COMPLETE WITH MICROSCOPIC (ARMC ONLY)
Bacteria, UA: NONE SEEN
Bilirubin Urine: NEGATIVE
Glucose, UA: NEGATIVE mg/dL
Hgb urine dipstick: NEGATIVE
Ketones, ur: NEGATIVE mg/dL
Leukocytes, UA: NEGATIVE
Nitrite: NEGATIVE
Protein, ur: NEGATIVE mg/dL
Specific Gravity, Urine: 1.009 (ref 1.005–1.030)
pH: 5 (ref 5.0–8.0)

## 2016-06-18 LAB — TROPONIN I: Troponin I: <0.03 ng/mL

## 2016-06-18 LAB — GLUCOSE, CAPILLARY: Glucose-Capillary: 278 mg/dL — ABNORMAL HIGH (ref 65–99)

## 2016-06-18 NOTE — Discharge Instructions (Signed)
Please take it easy this weekend. Please follow-up with your doctor early this coming week and be sure to return here if you have any further problems.

## 2016-06-18 NOTE — ED Triage Notes (Signed)
Patient presents to the ED post near-syncopal restaurant after eating breakfast at a [employer].  Family reports that family turned a grey/white color and lips turned blue for a few seconds.   Patient appears pale and tired at this time.  Skin is warm and dry.  Patient recently had a heart catheterization at [facility] but per daughter, nothing was found.  Patient also vomited x 4, post pre-syncopal episode this morning.

## 2016-06-18 NOTE — ED Provider Notes (Signed)
[facility] Emergency Department Provider Note   ____________________________________________   First MD Initiated Contact with Patient 06/18/16 [phone]     (approximate)  I have reviewed the triage vital signs and the nursing notes.   HISTORY  Chief Complaint Near Syncope   HPI Carly Cortez is a 75 y.o. female who went to eat with her family. After finishing eating at the restaurant she began to get nauseated went to get up to go to the car to go home and laid down. Had to be helped into the car because she felt like she would pass out and very faint pale with blue lips when she got in the car she ran vomiting. Patient feels somewhat better now. Patient denied any chest pain shortness of breath or any other complaints. Patient has had an MI in the past catheter at [facility] recently showed minor luminal irregularities and 2 patent stents. Patient's blood sugar in the morning was over 100 and here was 300.   Past Medical History:  Diagnosis Date  . Cancer ([place])   . Chronic kidney disease   . Diabetes mellitus without complication ([place])   . Gout   . High cholesterol   . Hypertension     There are no active problems to display for this patient.   Past Surgical History:  Procedure Laterality Date  . ABDOMINAL HYSTERECTOMY    . APPENDECTOMY    . cardiac stents    . CHOLECYSTECTOMY    . HERNIA REPAIR    . PARTIAL COLECTOMY      Prior to Admission medications   Medication Sig Start Date End Date Taking? Authorizing Provider  aspirin 81 MG tablet Take 81 mg by mouth daily.    Historical Provider, MD  azithromycin (ZITHROMAX Z-PAK) 250 MG tablet Take 2 tablets (500 mg) on  Day 1,  followed by 1 tablet (250 mg) once daily on Days 2 through 5. 05/14/15   Marylene Land, NP  benzonatate (TESSALON PERLES) 100 MG capsule Take 1 capsule (100 mg total) by mouth 3 (three) times daily as needed for cough. 05/14/15   Marylene Land, NP  carvedilol (COREG) 25 MG tablet  Take 25 mg by mouth 2 (two) times daily with a meal.    Historical Provider, MD  clobetasol cream (TEMOVATE) [id number] % Apply 1 application topically daily.    Historical Provider, MD  clobetasol ointment (TEMOVATE) [id number] % Apply 1 application topically 2 (two) times daily.    Historical Provider, MD  doxepin (SINEQUAN) 25 MG capsule Take 25 mg by mouth at bedtime.    Historical Provider, MD  insulin glargine (LANTUS) 100 UNIT/ML injection Inject 60 Units into the skin every morning.    Historical Provider, MD  insulin glargine (LANTUS) 100 UNIT/ML injection Inject 32 Units into the skin at bedtime.    Historical Provider, MD  isosorbide mononitrate (IMDUR) 120 MG 24 hr tablet Take 120 mg by mouth daily.    Historical Provider, MD  lisinopril (PRINIVIL,ZESTRIL) 40 MG tablet Take 40 mg by mouth daily.    Historical Provider, MD  nitroGLYCERIN (NITROSTAT) 0.4 MG SL tablet Place 0.4 mg under the tongue every 5 (five) minutes as needed for chest pain.    Historical Provider, MD  pantoprazole (PROTONIX) 40 MG tablet Take 40 mg by mouth daily.    Historical Provider, MD  simvastatin (ZOCOR) 40 MG tablet Take 40 mg by mouth daily.    Historical Provider, MD    Allergies Contrast media [  iodinated diagnostic agents]; Colchicine; Etodolac; Indomethacin; and Oxaprozin  No family history on file.  Social History Social History  Substance Use Topics  . Smoking status: Never Smoker  . Smokeless tobacco: Never Used  . Alcohol use No    Review of Systems Constitutional: No fever/chills Eyes: No visual changes. ENT: No sore throat. Cardiovascular: Denies chest pain. Respiratory: Denies shortness of breath. Gastrointestinal: No abdominal pain.  No diarrhea.  No constipation. Genitourinary: Negative for dysuria. Musculoskeletal: Negative for back pain. Skin: Negative for rash. Neurological: Negative for headaches, focal weakness or numbness.  10-point ROS otherwise  negative.  ____________________________________________   PHYSICAL EXAM:  VITAL SIGNS: ED Triage Vitals  Enc Vitals Group     BP 06/18/16 0939 110/67     Pulse Rate 06/18/16 0939 77     Resp 06/18/16 0939 18     Temp 06/18/16 0939 97.5 F (36.4 C)     Temp Source 06/18/16 0939 Oral     SpO2 06/18/16 0939 97 %     Weight 06/18/16 0939 205 lb (93 kg)     Height 06/18/16 0939 5\' 6"  (1.676 m)     Head Circumference --      Peak Flow --      Pain Score 06/18/16 0940 5     Pain Loc --      Pain Edu? --      Excl. in [place]? --     Constitutional: Alert and oriented. Mildly ill-appearing and in no acute distress. Eyes: Conjunctivae are normal. PERRL. EOMI. Head: Atraumatic. Nose: No congestion/rhinnorhea. Mouth/Throat: Mucous membranes are moist.  Oropharynx non-erythematous. Neck: No stridor.  Cardiovascular: Normal rate, regular rhythm. Grossly normal heart sounds.  Good peripheral circulation. Respiratory: Normal respiratory effort.  No retractions. Lungs CTAB. Gastrointestinal: Soft and nontender. No distention. No abdominal bruits. No CVA tenderness. Musculoskeletal: No lower extremity tenderness nor edema.  No joint effusions. Neurologic:  Normal speech and language. No gross focal neurologic deficits are appreciated renal nerves II through XII are intact cerebellar rapid alternating movements and finger to nose is normal bilaterally motor strength is 5 over 5 throughout sensation is normal for her. Skin:  Skin is warm, dry and intact. No rash noted. Psychiatric: Mood and affect are normal. Speech and behavior are normal.  ____________________________________________   LABS (all labs ordered are listed, but only abnormal results are displayed)  Labs Reviewed  COMPREHENSIVE METABOLIC PANEL - Abnormal; Notable for the following:       Result Value   CO2 21 (*)    Glucose, Bld 300 (*)    Creatinine, Ser 1.31 (*)    Calcium 8.7 (*)    ALT 9 (*)    GFR calc non Af Amer 39  (*)    GFR calc Af Amer 45 (*)    All other components within normal limits  CBC WITH DIFFERENTIAL/PLATELET - Abnormal; Notable for the following:    Hemoglobin 11.3 (*)    HCT 34.5 (*)    MCV 69.2 (*)    MCH 22.6 (*)    RDW 17.6 (*)    Neutro Abs 7.9 (*)    All other components within normal limits  GLUCOSE, CAPILLARY - Abnormal; Notable for the following:    Glucose-Capillary 278 (*)    All other components within normal limits  URINALYSIS COMPLETEWITH MICROSCOPIC ([facility] ONLY) - Abnormal; Notable for the following:    Color, Urine YELLOW (*)    APPearance CLEAR (*)    Squamous Epithelial /  LPF 0-5 (*)    All other components within normal limits  TROPONIN I  TROPONIN I   ____________________________________________  EKG  EKG read and is turbid by me shows normal sinus rhythm rate of 75 left axis there is some slight ST downsloping and depression laterally but this is present and in EKG from 08/13/2014. There are no acute changes. ____________________________________________  [id number] Study Result   CLINICAL DATA:  Syncope  EXAM: PORTABLE CHEST 1 VIEW  COMPARISON:  05/14/2015  FINDINGS: 0950 hours. Low volumes. Interstitial markings are diffusely coarsened with chronic features. The lungs are clear wiithout focal pneumonia, edema, pneumothorax or pleural effusion. The cardio pericardial silhouette is enlarged. Moderate to large hiatal hernia. Bones are diffusely demineralized. Telemetry leads overlie the chest.  IMPRESSION: Low volume film with cardiomegaly and chronic interstitial changes.  Moderate to large hiatal hernia.   Electronically Signed   By: Misty Stanley M.D.   On: 06/18/2016 10:06     ____________________________________________   PROCEDURES  Procedure(s) performed:  Procedures  Critical Care performed:  ____________________________________________   INITIAL IMPRESSION / ASSESSMENT AND PLAN / ED COURSE  Pertinent labs &  imaging results that were available during my care of the patient were reviewed by me and considered in my medical decision making (see chart for details).    Clinical Course     ____________________________________________   FINAL CLINICAL IMPRESSION(S) / ED DIAGNOSES  Final diagnoses:  Near syncope      NEW MEDICATIONS STARTED DURING THIS VISIT:  New Prescriptions   No medications on file     Note:  This document was prepared using Dragon voice recognition software and may include unintentional dictation errors.    Nena Polio, MD 06/18/16 [phone]

## 2016-07-18 ENCOUNTER — Encounter: Payer: Self-pay | Admitting: Emergency Medicine

## 2016-07-18 ENCOUNTER — Emergency Department
Admission: EM | Admit: 2016-07-18 | Discharge: 2016-07-18 | Disposition: A | Discharge status: Home / Self Care | Payer: Medicare Other | Attending: Emergency Medicine | Admitting: Emergency Medicine

## 2016-07-18 ENCOUNTER — Emergency Department: Payer: Medicare Other

## 2016-07-18 DIAGNOSIS — S60011A Contusion of right thumb without damage to nail, initial encounter: Secondary | ICD-10-CM | POA: Diagnosis not present

## 2016-07-18 DIAGNOSIS — S299XXA Unspecified injury of thorax, initial encounter: Secondary | ICD-10-CM | POA: Diagnosis present

## 2016-07-18 DIAGNOSIS — Z859 Personal history of malignant neoplasm, unspecified: Secondary | ICD-10-CM | POA: Diagnosis not present

## 2016-07-18 DIAGNOSIS — E119 Type 2 diabetes mellitus without complications: Secondary | ICD-10-CM | POA: Insufficient documentation

## 2016-07-18 DIAGNOSIS — Z794 Long term (current) use of insulin: Secondary | ICD-10-CM | POA: Diagnosis not present

## 2016-07-18 DIAGNOSIS — Y939 Activity, unspecified: Secondary | ICD-10-CM | POA: Insufficient documentation

## 2016-07-18 DIAGNOSIS — I129 Hypertensive chronic kidney disease with stage 1 through stage 4 chronic kidney disease, or unspecified chronic kidney disease: Secondary | ICD-10-CM | POA: Insufficient documentation

## 2016-07-18 DIAGNOSIS — Z7982 Long term (current) use of aspirin: Secondary | ICD-10-CM | POA: Insufficient documentation

## 2016-07-18 DIAGNOSIS — N189 Chronic kidney disease, unspecified: Secondary | ICD-10-CM | POA: Diagnosis not present

## 2016-07-18 DIAGNOSIS — Y92219 Unspecified school as the place of occurrence of the external cause: Secondary | ICD-10-CM | POA: Insufficient documentation

## 2016-07-18 DIAGNOSIS — Z79899 Other long term (current) drug therapy: Secondary | ICD-10-CM | POA: Diagnosis not present

## 2016-07-18 DIAGNOSIS — S20219A Contusion of unspecified front wall of thorax, initial encounter: Secondary | ICD-10-CM | POA: Diagnosis not present

## 2016-07-18 DIAGNOSIS — Y999 Unspecified external cause status: Secondary | ICD-10-CM | POA: Diagnosis not present

## 2016-07-18 LAB — CBC WITH DIFFERENTIAL/PLATELET
BASOS ABS: 0 10*3/uL (ref 0–0.1)
BASOS PCT: 0 %
EOS ABS: 0.1 10*3/uL (ref 0–0.7)
EOS PCT: 1 %
HCT: 35.9 % (ref 35.0–47.0)
Hemoglobin: 11.3 g/dL — ABNORMAL LOW (ref 12.0–16.0)
Lymphocytes Relative: 17 %
Lymphs Abs: 2 10*3/uL (ref 1.0–3.6)
MCH: 21.5 pg — ABNORMAL LOW (ref 26.0–34.0)
MCHC: 31.6 g/dL — ABNORMAL LOW (ref 32.0–36.0)
MCV: 68.1 fL — ABNORMAL LOW (ref 80.0–100.0)
MONO ABS: 0.8 10*3/uL (ref 0.2–0.9)
Monocytes Relative: 7 %
Neutro Abs: 8.7 10*3/uL — ABNORMAL HIGH (ref 1.4–6.5)
Neutrophils Relative %: 75 %
PLATELETS: 264 10*3/uL (ref 150–440)
RBC: 5.27 MIL/uL — ABNORMAL HIGH (ref 3.80–5.20)
RDW: 18.2 % — AB (ref 11.5–14.5)
WBC: 11.6 10*3/uL — ABNORMAL HIGH (ref 3.6–11.0)

## 2016-07-18 LAB — COMPREHENSIVE METABOLIC PANEL
ALBUMIN: 3.5 g/dL (ref 3.5–5.0)
ALK PHOS: 69 U/L (ref 38–126)
ALT: 10 U/L — ABNORMAL LOW (ref 14–54)
AST: 24 U/L (ref 15–41)
Anion gap: 6 (ref 5–15)
BILIRUBIN TOTAL: 0.5 mg/dL (ref 0.3–1.2)
BUN: 20 mg/dL (ref 6–20)
CALCIUM: 8.8 mg/dL — AB (ref 8.9–10.3)
CO2: 22 mmol/L (ref 22–32)
Chloride: 107 mmol/L (ref 101–111)
Creatinine, Ser: 1.09 mg/dL — ABNORMAL HIGH (ref 0.44–1.00)
GFR calc Af Amer: 57 mL/min — ABNORMAL LOW (ref >60)
GFR, EST NON AFRICAN AMERICAN: 49 mL/min — AB (ref >60)
GLUCOSE: 170 mg/dL — AB (ref 65–99)
Potassium: 3.8 mmol/L (ref 3.5–5.1)
Sodium: 135 mmol/L (ref 135–145)
TOTAL PROTEIN: 7.2 g/dL (ref 6.5–8.1)

## 2016-07-18 LAB — TROPONIN I: Troponin I: <0.03 ng/mL (ref <0.03)

## 2016-07-18 MED ORDER — HYDROCODONE-ACETAMINOPHEN 5-325 MG PO TABS
1.0000 | ORAL_TABLET | Freq: Once | ORAL | Status: AC
  Administered 2016-07-18: 1 via ORAL
  Filled 2016-07-18: qty 1

## 2016-07-18 MED ORDER — HYDROCODONE-ACETAMINOPHEN 5-325 MG PO TABS: 1.0000 | ORAL_TABLET | Freq: Four times a day (QID) | ORAL | 0 refills | Status: DC | PRN
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
## 2016-07-18 NOTE — ED Provider Notes (Signed)
EKG interpreted by me. Normal sinus rhythm rate of 83, normal axis intervals QRS ST segments and T waves.   Carrie Mew, MD 07/18/16 1309

## 2016-07-18 NOTE — Discharge Instructions (Signed)
Follow-up with your primary care doctor if any continued problems. Begin taking Norco as needed for pain. Beware that this medication could cause drowsiness increase your risk for falling. Use your walker when walking. Ice pack to chest as needed for discomfort. Wear splint on your thumb for protection and support. Also use ice to this area to reduce swelling.

## 2016-12-17 ENCOUNTER — Observation Stay
Admission: EM | Admit: 2016-12-17 | Discharge: 2016-12-18 | Disposition: A | Discharge status: Home / Self Care | Payer: Medicare Other | Attending: Internal Medicine | Admitting: Internal Medicine

## 2016-12-17 ENCOUNTER — Encounter: Payer: Self-pay | Admitting: Emergency Medicine

## 2016-12-17 DIAGNOSIS — Z91041 Radiographic dye allergy status: Secondary | ICD-10-CM | POA: Insufficient documentation

## 2016-12-17 DIAGNOSIS — M109 Gout, unspecified: Secondary | ICD-10-CM | POA: Diagnosis not present

## 2016-12-17 DIAGNOSIS — N179 Acute kidney failure, unspecified: Secondary | ICD-10-CM | POA: Diagnosis not present

## 2016-12-17 DIAGNOSIS — Z794 Long term (current) use of insulin: Secondary | ICD-10-CM | POA: Diagnosis not present

## 2016-12-17 DIAGNOSIS — Z7982 Long term (current) use of aspirin: Secondary | ICD-10-CM | POA: Insufficient documentation

## 2016-12-17 DIAGNOSIS — Z85038 Personal history of other malignant neoplasm of large intestine: Secondary | ICD-10-CM | POA: Insufficient documentation

## 2016-12-17 DIAGNOSIS — E785 Hyperlipidemia, unspecified: Secondary | ICD-10-CM | POA: Insufficient documentation

## 2016-12-17 DIAGNOSIS — E7800 Pure hypercholesterolemia, unspecified: Secondary | ICD-10-CM | POA: Diagnosis not present

## 2016-12-17 DIAGNOSIS — N189 Chronic kidney disease, unspecified: Secondary | ICD-10-CM | POA: Diagnosis not present

## 2016-12-17 DIAGNOSIS — I129 Hypertensive chronic kidney disease with stage 1 through stage 4 chronic kidney disease, or unspecified chronic kidney disease: Secondary | ICD-10-CM | POA: Insufficient documentation

## 2016-12-17 DIAGNOSIS — E1122 Type 2 diabetes mellitus with diabetic chronic kidney disease: Secondary | ICD-10-CM | POA: Diagnosis not present

## 2016-12-17 DIAGNOSIS — Z79899 Other long term (current) drug therapy: Secondary | ICD-10-CM | POA: Insufficient documentation

## 2016-12-17 DIAGNOSIS — R55 Syncope and collapse: Secondary | ICD-10-CM | POA: Diagnosis not present

## 2016-12-17 DIAGNOSIS — K219 Gastro-esophageal reflux disease without esophagitis: Secondary | ICD-10-CM | POA: Diagnosis not present

## 2016-12-17 DIAGNOSIS — I252 Old myocardial infarction: Secondary | ICD-10-CM | POA: Diagnosis not present

## 2016-12-17 DIAGNOSIS — Z955 Presence of coronary angioplasty implant and graft: Secondary | ICD-10-CM | POA: Insufficient documentation

## 2016-12-17 DIAGNOSIS — Z9049 Acquired absence of other specified parts of digestive tract: Secondary | ICD-10-CM | POA: Diagnosis not present

## 2016-12-17 DIAGNOSIS — I2510 Atherosclerotic heart disease of native coronary artery without angina pectoris: Secondary | ICD-10-CM | POA: Diagnosis not present

## 2016-12-17 LAB — BASIC METABOLIC PANEL
ANION GAP: 9 (ref 5–15)
BUN: 22 mg/dL — ABNORMAL HIGH (ref 6–20)
CHLORIDE: 107 mmol/L (ref 101–111)
CO2: 20 mmol/L — ABNORMAL LOW (ref 22–32)
CREATININE: 1.33 mg/dL — AB (ref 0.44–1.00)
Calcium: 9 mg/dL (ref 8.9–10.3)
GFR calc non Af Amer: 38 mL/min — ABNORMAL LOW (ref >60)
GFR, EST AFRICAN AMERICAN: 44 mL/min — AB (ref >60)
Glucose, Bld: 207 mg/dL — ABNORMAL HIGH (ref 65–99)
Potassium: 4 mmol/L (ref 3.5–5.1)
SODIUM: 136 mmol/L (ref 135–145)

## 2016-12-17 LAB — URINALYSIS, COMPLETE (UACMP) WITH MICROSCOPIC
BILIRUBIN URINE: NEGATIVE
Glucose, UA: NEGATIVE mg/dL
HGB URINE DIPSTICK: NEGATIVE
Ketones, ur: NEGATIVE mg/dL
LEUKOCYTES UA: NEGATIVE
Nitrite: NEGATIVE
PH: 5 (ref 5.0–8.0)
Protein, ur: 30 mg/dL — AB
SPECIFIC GRAVITY, URINE: 1.021 (ref 1.005–1.030)

## 2016-12-17 LAB — GLUCOSE, CAPILLARY
GLUCOSE-CAPILLARY: 130 mg/dL — AB (ref 65–99)
GLUCOSE-CAPILLARY: 117 mg/dL — AB (ref 65–99)

## 2016-12-17 LAB — TROPONIN I
Troponin I: <0.03 ng/mL (ref <0.03)
Troponin I: <0.03 ng/mL (ref <0.03)

## 2016-12-17 LAB — CBC
HCT: 33.7 % — ABNORMAL LOW (ref 35.0–47.0)
HEMOGLOBIN: 10.5 g/dL — AB (ref 12.0–16.0)
MCH: 20.6 pg — ABNORMAL LOW (ref 26.0–34.0)
MCHC: 31.3 g/dL — ABNORMAL LOW (ref 32.0–36.0)
MCV: 65.8 fL — AB (ref 80.0–100.0)
PLATELETS: 331 10*3/uL (ref 150–440)
RBC: 5.11 MIL/uL (ref 3.80–5.20)
RDW: 18.7 % — ABNORMAL HIGH (ref 11.5–14.5)
WBC: 10.9 10*3/uL (ref 3.6–11.0)

## 2016-12-17 MED ORDER — HYDROCODONE-ACETAMINOPHEN 5-325 MG PO TABS: 1.0000 | ORAL_TABLET | ORAL | Status: DC | PRN

## 2016-12-17 MED ORDER — INSULIN ASPART 100 UNIT/ML ~~LOC~~ SOLN: 0.0000 [IU] | Freq: Three times a day (TID) | SUBCUTANEOUS | Status: DC

## 2016-12-17 MED ORDER — SODIUM CHLORIDE 0.9 % IV SOLN
INTRAVENOUS | Status: DC
  Administered 2016-12-17: 16:00:00 via INTRAVENOUS

## 2016-12-17 MED ORDER — ACETAMINOPHEN 325 MG PO TABS: 650.0000 mg | ORAL_TABLET | Freq: Four times a day (QID) | ORAL | Status: DC | PRN

## 2016-12-17 MED ORDER — SODIUM CHLORIDE 0.9 % IV BOLUS (SEPSIS)
1000.0000 mL | Freq: Once | INTRAVENOUS | Status: AC
  Administered 2016-12-17: 1000 mL via INTRAVENOUS

## 2016-12-17 MED ORDER — NITROGLYCERIN 0.4 MG SL SUBL: 0.4000 mg | SUBLINGUAL_TABLET | SUBLINGUAL | Status: DC | PRN

## 2016-12-17 MED ORDER — HYDROCODONE-ACETAMINOPHEN 5-325 MG PO TABS: 1.0000 | ORAL_TABLET | Freq: Four times a day (QID) | ORAL | Status: DC | PRN

## 2016-12-17 MED ORDER — INSULIN GLARGINE 100 UNIT/ML ~~LOC~~ SOLN
10.0000 [IU] | Freq: Every day | SUBCUTANEOUS | Status: DC
  Filled 2016-12-17 (×2): qty 0.1

## 2016-12-17 MED ORDER — ISOSORBIDE MONONITRATE ER 60 MG PO TB24
120.0000 mg | ORAL_TABLET | Freq: Every day | ORAL | Status: DC
  Administered 2016-12-18: 120 mg via ORAL
  Filled 2016-12-17: qty 2

## 2016-12-17 MED ORDER — ACETAMINOPHEN 650 MG RE SUPP: 650.0000 mg | Freq: Four times a day (QID) | RECTAL | Status: DC | PRN

## 2016-12-17 MED ORDER — BENZONATATE 100 MG PO CAPS: 100.0000 mg | ORAL_CAPSULE | Freq: Three times a day (TID) | ORAL | Status: DC | PRN

## 2016-12-17 MED ORDER — PANTOPRAZOLE SODIUM 40 MG PO TBEC
40.0000 mg | DELAYED_RELEASE_TABLET | Freq: Every day | ORAL | Status: DC
  Administered 2016-12-18: 40 mg via ORAL
  Filled 2016-12-17: qty 1

## 2016-12-17 MED ORDER — ASPIRIN EC 81 MG PO TBEC
81.0000 mg | DELAYED_RELEASE_TABLET | Freq: Every day | ORAL | Status: DC
  Administered 2016-12-17 – 2016-12-18 (×2): 81 mg via ORAL
  Filled 2016-12-17 (×2): qty 1

## 2016-12-17 MED ORDER — CARVEDILOL 12.5 MG PO TABS
12.5000 mg | ORAL_TABLET | Freq: Two times a day (BID) | ORAL | Status: DC
Start: 2016-12-17 — End: 2016-12-18
  Administered 2016-12-17 – 2016-12-18 (×2): 12.5 mg via ORAL
  Filled 2016-12-17 (×2): qty 1

## 2016-12-17 MED ORDER — SIMVASTATIN 40 MG PO TABS
40.0000 mg | ORAL_TABLET | Freq: Every day | ORAL | Status: DC
  Administered 2016-12-17: 40 mg via ORAL
  Filled 2016-12-17: qty 1

## 2016-12-17 MED ORDER — LISINOPRIL 20 MG PO TABS
40.0000 mg | ORAL_TABLET | Freq: Every day | ORAL | Status: DC
  Administered 2016-12-18: 40 mg via ORAL
  Filled 2016-12-17: qty 2

## 2016-12-17 MED ORDER — SENNOSIDES-DOCUSATE SODIUM 8.6-50 MG PO TABS: 1.0000 | ORAL_TABLET | Freq: Every evening | ORAL | Status: DC | PRN

## 2016-12-17 MED ORDER — ONDANSETRON HCL 4 MG/2ML IJ SOLN: 4.0000 mg | Freq: Four times a day (QID) | INTRAMUSCULAR | Status: DC | PRN

## 2016-12-17 MED ORDER — ENOXAPARIN SODIUM 40 MG/0.4ML ~~LOC~~ SOLN
40.0000 mg | SUBCUTANEOUS | Status: DC
  Administered 2016-12-17: 40 mg via SUBCUTANEOUS
  Filled 2016-12-17: qty 0.4

## 2016-12-17 MED ORDER — BISACODYL 5 MG PO TBEC
5.0000 mg | DELAYED_RELEASE_TABLET | Freq: Every day | ORAL | Status: DC | PRN
Start: 2016-12-17 — End: 2016-12-18

## 2016-12-17 MED ORDER — ONDANSETRON HCL 4 MG PO TABS: 4.0000 mg | ORAL_TABLET | Freq: Four times a day (QID) | ORAL | Status: DC | PRN

## 2016-12-17 NOTE — ED Provider Notes (Signed)
[facility] Emergency Department Provider Note  ____________________________________________  Time seen: Approximately 11:21 AM  I have reviewed the triage vital signs and the nursing notes.   HISTORY  Chief Complaint Loss of Consciousness   HPI Carly Cortez is a 76 y.o. female colon and vulva cancer on remission, CKD, CAD s/p stents (last LHC 05/2016 showing Mild coronary artery disease with patent stents in the RCA and LAD), HTN, HLD, and anemia who presents for evaluation of a near syncopal episode. Patient reports that she woke up feeling well this morning. Had a biscuit and took all her medications. She then went shopping with her husband. While they were at the store she felt like she had to have a bowel movement. She went to the bathroom but nothing came out. As they were driving out of the store she reports that she had again had the urge to have a bowel movement and developed cramping abdominal pain. She had a watery bowel movement while in the car that she was unable to control, she started to feel lightheaded and nauseous like she was going to pass out but did not fully lose consciousness. Patient reports that since having a colectomy for her colon cancer she has had diarrhea on and off. She had another episode of diarrhea here in the emergency room. She denies vomiting, fever, chills, chest pain, palpitations, shortness of breath, headache, abdominal pain, back pain both preceding or after this episode.  Past Medical History:  Diagnosis Date  . 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 Chronic kidney disease   . Diabetes mellitus without complication ([place])   . Gout   . High cholesterol   . Hypertension     There are no active problems to display for this patient.   Past Surgical History:  Procedure Laterality Date  . ABDOMINAL HYSTERECTOMY    . APPENDECTOMY    . cardiac stents    . CHOLECYSTECTOMY    . HERNIA REPAIR    . PARTIAL COLECTOMY      Prior  to Admission medications   Medication Sig Start Date End Date Taking? Authorizing Provider  carvedilol (COREG) 12.5 MG tablet Take 12.5 mg by mouth 2 (two) times daily. 12/06/16  Yes Historical Provider, MD  ezetimibe (ZETIA) 10 MG tablet Take 10 mg by mouth daily. 10/13/16  Yes Historical Provider, MD  nitroGLYCERIN (NITROSTAT) 0.4 MG SL tablet Place 0.4 mg under the tongue every 5 (five) minutes as needed for chest pain.   Yes Historical Provider, MD  aspirin 81 MG tablet Take 81 mg by mouth daily.    Historical Provider, MD  benzonatate (TESSALON PERLES) 100 MG capsule Take 1 capsule (100 mg total) by mouth 3 (three) times daily as needed for cough. Patient not taking: Reported on 12/17/2016 05/14/15   Marylene Land, NP  clobetasol cream (TEMOVATE) 8.88 % Apply 1 application topically daily.    Historical Provider, MD  clobetasol ointment (TEMOVATE) 9.16 % Apply 1 application topically 2 (two) times daily.    Historical Provider, MD  doxepin (SINEQUAN) 25 MG capsule Take 25 mg by mouth at bedtime.    Historical Provider, MD  HYDROcodone-acetaminophen (NORCO/VICODIN) 5-325 MG tablet Take 1 tablet by mouth every 6 (six) hours as needed for moderate pain. Patient not taking: Reported on 12/17/2016 07/18/16   Johnn Hai, PA-C  insulin glargine (LANTUS) 100 UNIT/ML injection Inject 60 Units into the skin every morning.    Historical Provider, MD  insulin  glargine (LANTUS) 100 UNIT/ML injection Inject 32 Units into the skin at bedtime.    Historical Provider, MD  isosorbide mononitrate (IMDUR) 120 MG 24 hr tablet Take 120 mg by mouth daily.    Historical Provider, MD  lisinopril (PRINIVIL,ZESTRIL) 40 MG tablet Take 40 mg by mouth daily.    Historical Provider, MD  pantoprazole (PROTONIX) 40 MG tablet Take 40 mg by mouth daily.    Historical Provider, MD  simvastatin (ZOCOR) 40 MG tablet Take 40 mg by mouth daily.    Historical Provider, MD    Allergies Contrast media [iodinated diagnostic agents];  Colchicine; Etodolac; Indomethacin; and Oxaprozin  History reviewed. No pertinent family history.  Social History Social History  Substance Use Topics  . Smoking status: Never Smoker  . Smokeless tobacco: Never Used  . Alcohol use No    Review of Systems  Constitutional: Negative for fever. Eyes: Negative for visual changes. ENT: Negative for sore throat. Neck: No neck pain  Cardiovascular: Negative for chest pain. Respiratory: Negative for shortness of breath. Gastrointestinal: Negative for abdominal pain, vomiting. + diarrhea. Genitourinary: Negative for dysuria. Musculoskeletal: Negative for back pain. Skin: Negative for rash. Neurological: Negative for headaches, weakness or numbness. + near syncope Psych: No SI or HI  ____________________________________________   PHYSICAL EXAM:  VITAL SIGNS: ED Triage Vitals  Enc Vitals Group     BP 12/17/16 1113 121/73     Pulse Rate 12/17/16 1113 72     Resp 12/17/16 1113 16     Temp 12/17/16 1113 97.7 F (36.5 C)     Temp Source 12/17/16 1113 Oral     SpO2 12/17/16 1113 98 %     Weight 12/17/16 1106 202 lb (91.6 kg)     Height 12/17/16 1106 5\' 4"  (1.626 m)     Head Circumference --      Peak Flow --      Pain Score 12/17/16 1106 0     Pain Loc --      Pain Edu? --      Excl. in [place]? --     Constitutional: Alert and oriented. Well appearing and in no apparent distress. HEENT:      Head: Normocephalic and atraumatic.         Eyes: Conjunctivae are normal. Sclera is non-icteric. EOMI. PERRL      Mouth/Throat: Mucous membranes are moist.       Neck: Supple with no signs of meningismus. Cardiovascular: Regular rate and rhythm. No murmurs, gallops, or rubs. 2+ symmetrical distal pulses are present in all extremities. No JVD. Respiratory: Normal respiratory effort. Lungs are clear to auscultation bilaterally. No wheezes, crackles, or rhonchi.  Gastrointestinal: Soft, non tender, and non distended with positive bowel  sounds. No rebound or guarding. Musculoskeletal: Nontender with normal range of motion in all extremities. No edema, cyanosis, or erythema of extremities. Neurologic: Normal speech and language. A & O x3, PERRL, no nystagmus, CN II-XII intact, motor testing reveals good tone and bulk throughout. There is no evidence of pronator drift or dysmetria. Muscle strength is 5/5 throughout. Deep tendon reflexes are 2+ throughout with downgoing toes. Sensory examination is intact. Gait deferred Skin: Skin is warm, dry and intact. No rash noted. Psychiatric: Mood and affect are normal. Speech and behavior are normal.  ____________________________________________   LABS (all labs ordered are listed, but only abnormal results are displayed)  Labs Reviewed  BASIC METABOLIC PANEL - Abnormal; Notable for the following:       Result  Value   CO2 20 (*)    Glucose, Bld 207 (*)    BUN 22 (*)    Creatinine, Ser 1.33 (*)    GFR calc non Af Amer 38 (*)    GFR calc Af Amer 44 (*)    All other components within normal limits  CBC - Abnormal; Notable for the following:    Hemoglobin 10.5 (*)    HCT 33.7 (*)    MCV 65.8 (*)    MCH 20.6 (*)    MCHC 31.3 (*)    RDW 18.7 (*)    All other components within normal limits  URINALYSIS, COMPLETE (UACMP) WITH MICROSCOPIC - Abnormal; Notable for the following:    Color, Urine AMBER (*)    APPearance HAZY (*)    Protein, ur 30 (*)    Bacteria, UA RARE (*)    Squamous Epithelial / LPF 0-5 (*)    All other components within normal limits  TROPONIN I  CBG MONITORING, ED   ____________________________________________  EKG  ED ECG REPORT I, Rudene Re, the attending physician, personally viewed and interpreted this ECG.  Normal sinus rhythm, rate of 69, left bundle branch block, normal QTc interval, normal axis, no ST elevation, slight ST depression in leads I and II which is new when compared to prior from  07/2016 ____________________________________________  RADIOLOGY  none ____________________________________________   PROCEDURES  Procedure(s) performed: None Procedures Critical Care performed:  None ____________________________________________   INITIAL IMPRESSION / ASSESSMENT AND PLAN / ED COURSE   76 y.o. female colon and vulva cancer on remission, CKD, CAD s/p stents (last LHC 05/2016 showing Mild coronary artery disease with patent stents in the RCA and LAD), HTN, HLD, and anemia who presents for evaluation of a near syncopal episode in the setting of having an urge to defecate while in the car. Patient is extremely well appearing, no distress, normal vital signs, AG shows slight ST depression in lead I and II which is new when compared to prior from 2017. NO STEMI or arrhythmia. History sounds like vasovagal however with EKG changes will get troponins and monitor patient on telemetry. Will check labs.   Clinical Course as of Dec 17 1217  Sat Dec 17, 2016  1218 Labs with no acute findings. Troponin is negative. Due to changes in EKG and h/o cardiac disease will admit.  [CV]    Clinical Course User Index [CV] Rudene Re, MD    Pertinent labs & imaging results that were available during my care of the patient were reviewed by me and considered in my medical decision making (see chart for details).    ____________________________________________   FINAL CLINICAL IMPRESSION(S) / ED DIAGNOSES  Final diagnoses:  Near syncope      NEW MEDICATIONS STARTED DURING THIS VISIT:  New Prescriptions   No medications on file     Note:  This document was prepared using Dragon voice recognition software and may include unintentional dictation errors.    Rudene Re, MD 12/17/16 [phone]

## 2016-12-17 NOTE — ED Notes (Signed)
Daughter Annia Belt [phone]

## 2016-12-17 NOTE — H&P (Signed)
[facility] [place] NAME: Carly Cortez    MR#:  [id number]  DATE OF BIRTH:  1940-09-24  DATE OF ADMISSION:  12/17/2016  PRIMARY CARE PHYSICIAN: Verita Lamb, NP   REQUESTING/REFERRING PHYSICIAN: dr Alfred Levins  CHIEF COMPLAINT:   My vision blacked out HISTORY OF PRESENT ILLNESS:  Carly Cortez  is a 76 y.o. female with a known history of CAD with PCI (LAD and RCA), HTN and vulvular cancer who presents to ED after Having an episode of complete blackness with her vision. She denies loss of consciousness.  Patient reports this morning she was shopping when she felt queasy and felt like passing out. She thought she had to use the restroom so she went to the bathroom but was not able to have a bowel movement.  On her way home patient reports that her vision completely blacked out although she did not lose consciousness. She did not have chest pain, syncope or shortness of breath prior or after this episode. She did report that she had an urge to have a bowel movement and developed cramping abdominal pain prior to this episode. Patient was then brought to the ER for further evaluation. She had several bowel movements while in the ER. Patient does report she has episodes of diarrhea due to previous colon surgery. EKG shows some slight depression in leads 1 and 2. Patient is now back at her baseline and reports no diarrhea, abdominal pain, chest pain or shortness of breath.  PAST MEDICAL HISTORY:   Past Medical History:  Diagnosis Date  . 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 Chronic kidney disease   . Diabetes mellitus without complication ([place])   . Gout   . High cholesterol   . Hypertension     PAST SURGICAL HISTORY:   Past Surgical History:  Procedure Laterality Date  . ABDOMINAL HYSTERECTOMY    . APPENDECTOMY    . cardiac stents    . CHOLECYSTECTOMY    . HERNIA REPAIR    . PARTIAL COLECTOMY      SOCIAL HISTORY:   Social History  Substance  Use Topics  . Smoking status: Never Smoker  . Smokeless tobacco: Never Used  . Alcohol use No    FAMILY HISTORY:   DM  HTN Heart dailure DRUG ALLERGIES:   Allergies  Allergen Reactions  . Contrast Media [Iodinated Diagnostic Agents] Swelling  . Colchicine Other (See Comments)    Gallbladder Problems  . Etodolac Swelling  . Indomethacin Swelling  . Oxaprozin Swelling    REVIEW OF SYSTEMS:   Review of Systems  Constitutional: Negative.  Negative for chills, fever and malaise/fatigue.  HENT: Negative.  Negative for ear discharge, ear pain, hearing loss, nosebleeds and sore throat.   Eyes: Negative.  Negative for blurred vision and pain.  Respiratory: Negative.  Negative for cough, hemoptysis, shortness of breath and wheezing.   Cardiovascular: Negative.  Negative for chest pain, palpitations and leg swelling.       LOC   Gastrointestinal: Negative.  Negative for abdominal pain, blood in stool, diarrhea, nausea and vomiting.  Genitourinary: Negative.  Negative for dysuria.  Musculoskeletal: Negative.  Negative for back pain.  Skin: Negative.   Neurological: Negative for dizziness, tremors, speech change, focal weakness, seizures and headaches.  Endo/Heme/Allergies: Negative.  Does not bruise/bleed easily.  Psychiatric/Behavioral: Negative.  Negative for depression, hallucinations and suicidal ideas.    MEDICATIONS AT HOME:   Prior to Admission medications   Medication  Sig Start Date End Date Taking? Authorizing Provider  carvedilol (COREG) 12.5 MG tablet Take 12.5 mg by mouth 2 (two) times daily. 12/06/16  Yes Historical Provider, MD  ezetimibe (ZETIA) 10 MG tablet Take 10 mg by mouth daily. 10/13/16  Yes Historical Provider, MD  nitroGLYCERIN (NITROSTAT) 0.4 MG SL tablet Place 0.4 mg under the tongue every 5 (five) minutes as needed for chest pain.   Yes Historical Provider, MD  aspirin 81 MG tablet Take 81 mg by mouth daily.    Historical Provider, MD  benzonatate (TESSALON  PERLES) 100 MG capsule Take 1 capsule (100 mg total) by mouth 3 (three) times daily as needed for cough. Patient not taking: Reported on 12/17/2016 05/14/15   Marylene Land, NP  clobetasol cream (TEMOVATE) 7.03 % Apply 1 application topically daily.    Historical Provider, MD  clobetasol ointment (TEMOVATE) 5.00 % Apply 1 application topically 2 (two) times daily.    Historical Provider, MD  doxepin (SINEQUAN) 25 MG capsule Take 25 mg by mouth at bedtime.    Historical Provider, MD  HYDROcodone-acetaminophen (NORCO/VICODIN) 5-325 MG tablet Take 1 tablet by mouth every 6 (six) hours as needed for moderate pain. Patient not taking: Reported on 12/17/2016 07/18/16   Johnn Hai, PA-C  insulin glargine (LANTUS) 100 UNIT/ML injection Inject 60 Units into the skin every morning.    Historical Provider, MD  insulin glargine (LANTUS) 100 UNIT/ML injection Inject 32 Units into the skin at bedtime.    Historical Provider, MD  isosorbide mononitrate (IMDUR) 120 MG 24 hr tablet Take 120 mg by mouth daily.    Historical Provider, MD  lisinopril (PRINIVIL,ZESTRIL) 40 MG tablet Take 40 mg by mouth daily.    Historical Provider, MD  pantoprazole (PROTONIX) 40 MG tablet Take 40 mg by mouth daily.    Historical Provider, MD  simvastatin (ZOCOR) 40 MG tablet Take 40 mg by mouth daily.    Historical Provider, MD      VITAL SIGNS:  Blood pressure (!) 97/59, pulse 73, temperature 97.7 F (36.5 C), temperature source Oral, resp. rate 18, height 5\' 4"  (1.626 m), weight 91.6 kg (202 lb), SpO2 99 %.  PHYSICAL EXAMINATION:   Physical Exam  Constitutional: She is oriented to person, place, and time and well-developed, well-nourished, and in no distress. No distress.  HENT:  Head: Normocephalic.  Eyes: No scleral icterus.  Neck: Normal range of motion. Neck supple. No JVD present. No tracheal deviation present.  Cardiovascular: Normal rate, regular rhythm and normal heart sounds.  Exam reveals no gallop and no  friction rub.   No murmur heard. Pulmonary/Chest: Effort normal and breath sounds normal. No respiratory distress. She has no wheezes. She has no rales. She exhibits no tenderness.  Abdominal: Soft. Bowel sounds are normal. She exhibits no distension and no mass. There is no tenderness. There is no rebound and no guarding.  Musculoskeletal: Normal range of motion. She exhibits no edema.  Neurological: She is alert and oriented to person, place, and time.  Skin: Skin is warm. No rash noted. No erythema.  Psychiatric: Affect and judgment normal.      LABORATORY PANEL:   CBC  Recent Labs Lab 12/17/16 1107  WBC 10.9  HGB 10.5*  HCT 33.7*  PLT 331   ------------------------------------------------------------------------------------------------------------------  Chemistries   Recent Labs Lab 12/17/16 1107  NA 136  K 4.0  CL 107  CO2 20*  GLUCOSE 207*  BUN 22*  CREATININE 1.33*  CALCIUM 9.0   ------------------------------------------------------------------------------------------------------------------  Cardiac Enzymes  Recent Labs Lab 12/17/16 1107  TROPONINI <0.03   ------------------------------------------------------------------------------------------------------------------  RADIOLOGY:  No results found.  EKG:   NSR ST depression in anterior leads  IMPRESSION AND PLAN:   76 year old female with a history of colon and vulvar cancer in remission, chronic kidney disease stage III , CAD status post PCI who presents with presyncope.  1. Presyncope: Patient symptoms sound mostly like vasovagal reaction. She has some mild ST depressions in leads 1 and 2 and with her history of coronary artery disease and stents it is best that patient be observed on telemetry. Consult cardiology requested. Follow telemetry Follow troponins orthostatic vital signs requested   2. History of CAD: Continue Coreg, lisinopril, aspirin, statin.  3. Diabetes: ADA diet  with sliding scale insulin and continue outpatient Lantus.  4. Essential hypertension: Continue isosorbide, Coreg and lisinopril  5. GERD : Co  ntinue Protonix All the records are reviewed and case discussed with ED provider. Management plans discussed with the patient and she in agreement  CODE STATUS: FULL  TOTAL TIME TAKING CARE OF THIS PATIENT: 45 minutes.    Annaleigha Woo M.D on 12/17/2016 at 12:24 PM  Between 7am to 6pm - Pager - [phone]  After 6pm go to [email or web address] - password [email or web address] [facility]  Office  [phone]  CC: Primary care physician; Verita Lamb, NP

## 2016-12-17 NOTE — ED Triage Notes (Signed)
Pt arrived via EMS from the grocery store after c/o syncopal episode in the car. Pt states she and her husband were out shopping and she started feeling weak and passed out briefly in the car in the parking lot.  At that time pt also had episode of diarrhea. States she did eat breakfast and is a diabetic.  Blood sugar with EMS was 207. Pt is alert and oriented x4.  Denies any pain.

## 2016-12-17 NOTE — Care Management Obs Status (Signed)
[place] NOTIFICATION   Patient Details  Name: Carly Cortez MRN: [id number] Date of Birth: [birth year]   Medicare Observation Status Notification Given:  Yes (presyncope)    Ival Bible, RN 12/17/2016, 1:24 PM

## 2016-12-17 NOTE — ED Notes (Signed)
Attempted to get urine sample, patient unable to urinate at this time, she did have one episode of loose stools.

## 2016-12-17 NOTE — Progress Notes (Signed)
Patient arrived to the unit A&Ox4. Telemetry monitor placed. Patient denies pain. Orthostatic vitals assessed upon admission and documented. Vitals stable. IV fluids started. Bed alarm set. Plan of care initiated. Will continue to monitor.

## 2016-12-18 ENCOUNTER — Observation Stay (HOSPITAL_BASED_OUTPATIENT_CLINIC_OR_DEPARTMENT_OTHER)
Admit: 2016-12-18 | Discharge: 2016-12-18 | Disposition: A | Discharge status: Home / Self Care | Payer: Medicare Other | Attending: Cardiovascular Disease | Admitting: Cardiovascular Disease

## 2016-12-18 DIAGNOSIS — E782 Mixed hyperlipidemia: Secondary | ICD-10-CM

## 2016-12-18 DIAGNOSIS — R55 Syncope and collapse: Secondary | ICD-10-CM | POA: Diagnosis not present

## 2016-12-18 DIAGNOSIS — I25118 Atherosclerotic heart disease of native coronary artery with other forms of angina pectoris: Secondary | ICD-10-CM | POA: Diagnosis not present

## 2016-12-18 DIAGNOSIS — Z9889 Other specified postprocedural states: Secondary | ICD-10-CM | POA: Diagnosis not present

## 2016-12-18 DIAGNOSIS — H547 Unspecified visual loss: Secondary | ICD-10-CM | POA: Diagnosis not present

## 2016-12-18 DIAGNOSIS — R079 Chest pain, unspecified: Secondary | ICD-10-CM | POA: Diagnosis not present

## 2016-12-18 DIAGNOSIS — R42 Dizziness and giddiness: Secondary | ICD-10-CM | POA: Diagnosis not present

## 2016-12-18 LAB — BASIC METABOLIC PANEL
ANION GAP: 5 (ref 5–15)
BUN: 20 mg/dL (ref 6–20)
CALCIUM: 8.7 mg/dL — AB (ref 8.9–10.3)
CO2: 24 mmol/L (ref 22–32)
Chloride: 112 mmol/L — ABNORMAL HIGH (ref 101–111)
Creatinine, Ser: 1.12 mg/dL — ABNORMAL HIGH (ref 0.44–1.00)
GFR calc non Af Amer: 47 mL/min — ABNORMAL LOW (ref >60)
GFR, EST AFRICAN AMERICAN: 54 mL/min — AB (ref >60)
Glucose, Bld: 95 mg/dL (ref 65–99)
Potassium: 4 mmol/L (ref 3.5–5.1)
SODIUM: 141 mmol/L (ref 135–145)

## 2016-12-18 LAB — CBC
HCT: 32.4 % — ABNORMAL LOW (ref 35.0–47.0)
HEMOGLOBIN: 10.1 g/dL — AB (ref 12.0–16.0)
MCH: 20.9 pg — ABNORMAL LOW (ref 26.0–34.0)
MCHC: 31.3 g/dL — ABNORMAL LOW (ref 32.0–36.0)
MCV: 66.7 fL — ABNORMAL LOW (ref 80.0–100.0)
PLATELETS: 289 10*3/uL (ref 150–440)
RBC: 4.86 MIL/uL (ref 3.80–5.20)
RDW: 18.9 % — ABNORMAL HIGH (ref 11.5–14.5)
WBC: 8.5 10*3/uL (ref 3.6–11.0)

## 2016-12-18 LAB — GLUCOSE, CAPILLARY
GLUCOSE-CAPILLARY: 82 mg/dL (ref 65–99)
GLUCOSE-CAPILLARY: 119 mg/dL — AB (ref 65–99)

## 2016-12-18 LAB — ECHOCARDIOGRAM COMPLETE
HEIGHTINCHES: 64 in
Weight: 3267.2 oz

## 2016-12-18 LAB — TROPONIN I

## 2016-12-18 NOTE — Care Management Obs Status (Signed)
[place] NOTIFICATION   Patient Details  Name: Carly Cortez MRN: [id number] Date of Birth: May 30, 1941   Medicare Observation Status Notification Given:  Yes [facility] letter given)    Mardene Speak, RN 12/18/2016, 2:45 PM

## 2016-12-18 NOTE — Discharge Summary (Signed)
[facility] [place] NAME: Carly Cortez    MR#:  [id number]  DATE OF BIRTH:  Jan 02, 1941  DATE OF ADMISSION:  12/17/2016 ADMITTING PHYSICIAN: Bettey Costa, MD  DATE OF DISCHARGE: 12/18/2016  PRIMARY CARE PHYSICIAN: Verita Lamb, NP    ADMISSION DIAGNOSIS:  Near syncope [R55]  DISCHARGE DIAGNOSIS:  Active Problems:   Syncope   Vasovagal episode.  SECONDARY DIAGNOSIS:   Past Medical History:  Diagnosis Date  . 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 Chronic kidney disease   . Diabetes mellitus without complication ([place])   . Gout   . High cholesterol   . Hypertension     HOSPITAL COURSE:   -Near-syncope/dizziness Likely secondary to vasovagal etiology, exacerbated by GI disorder History of partial colectomy per the patient, often has waxing waning diarrhea with constipation, urgency. Episode of dizziness with vision disorder happened in the setting of GI bowel movement urgency, She's been on telemetry since arrival with no arrhythmia Cardiac enzymes negative Last 2 cardiac catheterizations with nonobstructive disease, done at [facility] Prior to event had no symptoms concerning for ischemia No other neurologic manifestations concerning for TIA or stroke Creatinine elevated on arrival concerning for prerenal state which may have exacerbated her symptoms She is currently not on any diuretics. Reports that she tries to stay hydrated but has frequent loose bowel movements from colon Surgery, likely leading to dehydration Stressed importance of staying hydrated through the summer -We have recommended to her that she contact cardiology at [facility] for post hospital follow-up.  Would continue current medications for now  ----CAD, previous MI Prior stents 2 to the LAD, stent 2 to the RCA. We have stressed importance of compliance with her cholesterol medication Negative cardiac enzymes, EKG essentially benign  ----Vision loss Likely in  conjunction with vasovagal etiology, dizziness Quickly improved when she was evaluated by EMTs, likely placed in supine position I suspect with improvement of her blood pressure.   ----Acute renal failure Improved with hydration, normal saline  -----Hyperlipidemia Recent LDL indicate she is not close to goal Will stress the importance of compliance with her Crestor 40 mill grams daily, it would appear that Zetia was added recently by outpatient cardiology  -----s/p colon surgery She reports frequent abdominal discomfort, diarrhea, periodic constipation Likely leading to above.  Stressed importance of laying supine when she is dizzy concerning for orthostasis   DISCHARGE CONDITIONS:   Stable.  CONSULTS OBTAINED:  Treatment Team:  Minna Merritts, MD  DRUG ALLERGIES:   Allergies  Allergen Reactions  . Contrast Media [Iodinated Diagnostic Agents] Swelling  . Colchicine Other (See Comments)    Gallbladder Problems  . Etodolac Swelling  . Indomethacin Swelling  . Oxaprozin Swelling    DISCHARGE MEDICATIONS:   Current Discharge Medication List    CONTINUE these medications which have NOT CHANGED   Details  aspirin 81 MG tablet Take 81 mg by mouth daily.    carvedilol (COREG) 12.5 MG tablet Take 12.5 mg by mouth 2 (two) times daily.    clobetasol ointment (TEMOVATE) 5.85 % Apply 1 application topically 2 (two) times daily.    insulin glargine (LANTUS) 100 UNIT/ML injection Inject 10-15 Units into the skin at bedtime.     isosorbide mononitrate (IMDUR) 120 MG 24 hr tablet Take 120 mg by mouth daily.    lisinopril (PRINIVIL,ZESTRIL) 40 MG tablet Take 40 mg by mouth daily.    nitroGLYCERIN (NITROSTAT) 0.4 MG SL tablet Place 0.4 mg  under the tongue every 5 (five) minutes as needed for chest pain.    pantoprazole (PROTONIX) 40 MG tablet Take 40 mg by mouth daily.    simvastatin (ZOCOR) 40 MG tablet Take 40 mg by mouth daily.    benzonatate (TESSALON PERLES) 100 MG  capsule Take 1 capsule (100 mg total) by mouth 3 (three) times daily as needed for cough. Qty: 15 capsule, Refills: 0    HYDROcodone-acetaminophen (NORCO/VICODIN) 5-325 MG tablet Take 1 tablet by mouth every 6 (six) hours as needed for moderate pain. Qty: 20 tablet, Refills: 0         DISCHARGE INSTRUCTIONS:    Follow with PMD in 1-2 weeks.  If you experience worsening of your admission symptoms, develop shortness of breath, life threatening emergency, suicidal or homicidal thoughts you must seek medical attention immediately by calling 911 or calling your MD immediately  if symptoms less severe.  You Must read complete instructions/literature along with all the possible adverse reactions/side effects for all the Medicines you take and that have been prescribed to you. Take any new Medicines after you have completely understood and accept all the possible adverse reactions/side effects.   Please note  You were cared for by a hospitalist during your hospital stay. If you have any questions about your discharge medications or the care you received while you were in the hospital after you are discharged, you can call the unit and asked to speak with the hospitalist on call if the hospitalist that took care of you is not available. Once you are discharged, your primary care physician will handle any further medical issues. Please note that NO REFILLS for any discharge medications will be authorized once you are discharged, as it is imperative that you return to your primary care physician (or establish a relationship with a primary care physician if you do not have one) for your aftercare needs so that they can reassess your need for medications and monitor your lab values.    Today   CHIEF COMPLAINT:   Chief Complaint  Patient presents with  . Loss of Consciousness    HISTORY OF PRESENT ILLNESS:  Carly Cortez  is a 76 y.o. female with a known history of CAD with PCI (LAD and RCA), HTN  and vulvular cancer who presents to ED after Having an episode of complete blackness with her vision. She denies loss of consciousness.  Patient reports this morning she was shopping when she felt queasy and felt like passing out. She thought she had to use the restroom so she went to the bathroom but was not able to have a bowel movement.  On her way home patient reports that her vision completely blacked out although she did not lose consciousness. She did not have chest pain, syncope or shortness of breath prior or after this episode. She did report that she had an urge to have a bowel movement and developed cramping abdominal pain prior to this episode. Patient was then brought to the ER for further evaluation. She had several bowel movements while in the ER. Patient does report she has episodes of diarrhea due to previous colon surgery. EKG shows some slight depression in leads 1 and 2. Patient is now back at her baseline and reports no diarrhea, abdominal pain, chest pain or shortness of breath.   VITAL SIGNS:  Blood pressure 140/62, pulse 72, temperature 98.2 F (36.8 C), temperature source Oral, resp. rate 18, height 5\' 4"  (1.626 m), weight 92.6 kg (204  lb 3.2 oz), SpO2 99 %.  I/O:   Intake/Output Summary (Last 24 hours) at 12/18/16 1339 Last data filed at 12/18/16 1005  Gross per 24 hour  Intake           1187.5 ml  Output              600 ml  Net            587.5 ml    PHYSICAL EXAMINATION:  GENERAL:  76 y.o.-year-old patient lying in the bed with no acute distress.  EYES: Pupils equal, round, reactive to light and accommodation. No scleral icterus. Extraocular muscles intact.  HEENT: Head atraumatic, normocephalic. Oropharynx and nasopharynx clear.  NECK:  Supple, no jugular venous distention. No thyroid enlargement, no tenderness.  LUNGS: Normal breath sounds bilaterally, no wheezing, rales,rhonchi or crepitation. No use of accessory muscles of respiration.  CARDIOVASCULAR: S1,  S2 normal. No murmurs, rubs, or gallops.  ABDOMEN: Soft, non-tender, non-distended. Bowel sounds present. No organomegaly or mass.  EXTREMITIES: No pedal edema, cyanosis, or clubbing.  NEUROLOGIC: Cranial nerves II through XII are intact. Muscle strength 5/5 in all extremities. Sensation intact. Gait not checked.  PSYCHIATRIC: The patient is alert and oriented x 3.  SKIN: No obvious rash, lesion, or ulcer.   DATA REVIEW:   CBC  Recent Labs Lab 12/18/16 0152  WBC 8.5  HGB 10.1*  HCT 32.4*  PLT 289    Chemistries   Recent Labs Lab 12/18/16 0152  NA 141  K 4.0  CL 112*  CO2 24  GLUCOSE 95  BUN 20  CREATININE 1.12*  CALCIUM 8.7*    Cardiac Enzymes  Recent Labs Lab 12/18/16 0152  TROPONINI <0.03    Microbiology Results  Results for orders placed or performed in visit on 08/13/14  Wound Aerobic Culture     Status: None   Collection Time: 08/13/14  4:27 PM  Result Value Ref Range Status   Micro Text Report   Final       SOURCE: [facility]    ORGANISM 1                HEAVY GROWTH Staphylococcus aureus   GRAM STAIN                RARE WHITE BLOOD CELLS   GRAM STAIN                MODERATE GRAM POSITIVE COCCI   ANTIBIOTIC                    ORG#1     CIPROFLOXACIN                 S         CLINDAMYCIN                   S         ERYTHROMYCIN                  S         GENTAMICIN                    S         LEVOFLOXACIN                  S         LINEZOLID  S         OXACILLIN                     S         TIGECYCLINE                   S         Cefoxitin Screen              NEGATIVE  Inducible Clindamycin ResistanNEGATIVE  Trimethoprim/Sulfamethoxazole S             RADIOLOGY:  No results found.  EKG:   Orders placed or performed during the hospital encounter of 12/17/16  . ED EKG  . ED EKG  . EKG 12-Lead  . EKG 12-Lead      Management plans discussed with the patient, family and they are in agreement.  CODE STATUS:      Code Status Orders        Start     Ordered   12/17/16 1534  Full code  Continuous     12/17/16 1533    Code Status History    Date Active Date Inactive Code Status Order ID Comments User Context   This patient has a current code status but no historical code status.    Advance Directive Documentation     Most Recent Value  Type of Advance Directive  Healthcare Power of Attorney  Pre-existing out of facility DNR order (yellow form or pink MOST form)  -  "MOST" Form in Place?  -      TOTAL TIME TAKING CARE OF THIS PATIENT: 35 minutes.    Vaughan Basta M.D on 12/18/2016 at 1:39 PM  Between 7am to 6pm - Pager - [phone]  After 6pm go to [email or web address] - password [email or web address] [facility]  Office  [phone]  CC: Primary care physician; Verita Lamb, NP   Note: This dictation was prepared with Dragon dictation along with smaller phrase technology. Any transcriptional errors that result from this process are unintentional.

## 2016-12-18 NOTE — Discharge Instructions (Signed)
Near-Syncope °Near-syncope is when you suddenly get weak or dizzy, or you feel like you might pass out (faint). During an episode of near-syncope, you may: °· Feel dizzy or light-headed. °· Feel sick to your stomach (nauseous). °· See all white or all black. °· Have cold, clammy skin. ° °If you passed out, get help right away.Call your local emergency services (911 in the [place] Do not drive yourself to the hospital. °Follow these instructions at home: °Pay attention to any changes in your symptoms. Take these actions to help with your condition: °· Have someone stay with you until you feel stable. °· Do not drive, use machinery, or play sports until your doctor says it is okay. °· Keep all follow-up visits as told by your doctor. This is important. °· If you start to feel like you might pass out, lie down right away and raise (elevate) your feet above the level of your heart. Breathe deeply and steadily. Wait until all of the symptoms are gone. °· Drink enough fluid to keep your pee (urine) clear or pale yellow. °· If you are taking blood pressure or heart medicine, get up slowly and spend many minutes getting ready to sit and then stand. This can help with dizziness. °· Take over-the-counter and prescription medicines only as told by your doctor. ° °Get help right away if: °· You have a very bad headache. °· You have unusual pain in your chest, tummy, or back. °· You are bleeding from your mouth or rectum. °· You have black or tarry poop (stool). °· You have a very fast or uneven heartbeat (palpitations). °· You pass out one time or more than once. °· You have jerky movements that you cannot control (seizure). °· You are confused. °· You have trouble walking. °· You are very weak. °· You have vision problems. °These symptoms may be an emergency. Do not wait to see if the symptoms will go away. Get medical help right away. Call your local emergency services (911 in the [place] Do not drive yourself to the  hospital. °This information is not intended to replace advice given to you by your health care provider. Make sure you discuss any questions you have with your health care provider. °Document Released: 02/08/2008 Document Revised: 01/28/2016 Document Reviewed: 05/06/2015 °Elsevier Interactive Patient Education © 2017 [employer] ° °

## 2016-12-18 NOTE — Consult Note (Signed)
Cardiology Consultation Note  Patient ID: Carly Cortez, MRN: [id number], DOB/AGE: 1940/09/13 76 y.o. Admit date: 12/17/2016   Date of Consult: 12/18/2016 Primary Physician: Verita Lamb, NP Primary Cardiologist: [facility]  Chief Complaint: Dizzy, "vision went black" Reason for Consult: near syncope Physician requesting consult: Mody   HPI: 76 y.o. female with h/o coronary artery disease with colon cancer,Partial colectomy, episodes of diarrhea/constipation, postoperative MI with 2 stents to the RCA and 2 stents to the LAD (culprit vessel unclear), last catheterization September 2017 with patent stents presenting with dizziness, vision disturbance.  Reports that she was at [employer], developed some abdominal discomfort indicating she needed a bowel movement. Tried to go to the bathroom, was unable to but had continued abdominal discomfort. Riding in the car with continued GI disorder, started having lightheadedness, "vision went black". Husband drove her to ENT substation, was taken to the emergency room. Reports that her dizziness and vision issues resolved halfway to the hospital. In the emergency room she had several bowel movements, possibly 3 noted. No further episodes of lightheadedness or dizziness. She denies any focal neurologic deficits  Reports similar episodes approximately one year ago, typically associated with GI discomfort, dizziness,  remembers that her primary cardiologist made changes to her medications and symptoms seem to resolve on their own.   Cardiac enzymes negative EKG as detailed below Echocardiogram performed on this admission shows low normal ejection fraction, difficult imaging quality secondary to obesity, unable to exclude basal and mid inferior wall hypokinesis. Ejection fraction 50-55%  Records reviewed from [facility]  Left heart catheterization 06/03/16 CARDIAC CATHETERIZATION REPORT Mild coronary artery disease with patent stents in the RCA and  LAD  06/04/14 Echo Clinical Diagnoses and Echocardiographic Findings TECHNICALLY DIFFICULT STUDY DUE TO CHEST WALL/LUNG TISSUE Normal left ventricular ejection fraction (60-65%) Left ventricular hypertrophy Normal right ventricular contractile performance  PET 05/2014: - Abnormal myocardial perfusion study - There is a moderate in size, severe, completely reversible defect involving the apical lateral, mid anterolateral, mid inferolateral and basal anterolateral segments. This is consistent with ischemia. - During stress: Global systolic function is moderately reduced. The anterolateral wall is hypokinetic. The ejection fraction calculated at 49%.  - Attenuation CT scan shows post PCI findings - Incidentally noted on the attenuation CT scan is a moderate hiatal hernia, bilateral emphysematous changes of the lungs and a small right pleural effusion.   LHC 05/23/14 Coronary artery disease (as detailed above), including 40-50% mid-RCA in-stent restenosis and jailed first diagonal. Widely patent proximal and mid-LAD stents. Normal left sided filling pressure (LVEDP = 13 mmHg).  08/14/14 Myocardial perfusion [facility] at [facility]  No significant wall motion abnormality noted. Pharmacological myocardial perfusion study with no significant ischemia. Estimated LVEF 55%.      Past Medical History:  Diagnosis Date  . 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 Chronic kidney disease   . Diabetes mellitus without complication ([place])   . Gout   . High cholesterol   . Hypertension       Most Recent Cardiac Studies: Echocardiogram  The cavity size was normal. Systolic function was   normal. The estimated ejection fraction was in the range of 50% to 55%. Hypokinesis of the basal to mid inferior myocardium. Doppler parameters are consistent with abnormal left ventricular relaxation (grade 1 diastolic dysfunction). - Left atrium: The atrium was normal in size. - Right ventricle: Systolic function was  normal. - Pulmonary arteries: Systolic pressure could not be accurately estimated.  Surgical History:  Past Surgical History:  Procedure Laterality Date  . ABDOMINAL HYSTERECTOMY    . APPENDECTOMY    . cardiac stents    . CHOLECYSTECTOMY    . HERNIA REPAIR    . PARTIAL COLECTOMY       Home Meds: Prior to Admission medications   Medication Sig Start Date End Date Taking? Authorizing Provider  aspirin 81 MG tablet Take 81 mg by mouth daily.   Yes Historical Provider, MD  carvedilol (COREG) 12.5 MG tablet Take 12.5 mg by mouth 2 (two) times daily. 12/06/16  Yes Historical Provider, MD  clobetasol ointment (TEMOVATE) 1.19 % Apply 1 application topically 2 (two) times daily.   Yes Historical Provider, MD  insulin glargine (LANTUS) 100 UNIT/ML injection Inject 10-15 Units into the skin at bedtime.    Yes Historical Provider, MD  isosorbide mononitrate (IMDUR) 120 MG 24 hr tablet Take 120 mg by mouth daily.   Yes Historical Provider, MD  lisinopril (PRINIVIL,ZESTRIL) 40 MG tablet Take 40 mg by mouth daily.   Yes Historical Provider, MD  nitroGLYCERIN (NITROSTAT) 0.4 MG SL tablet Place 0.4 mg under the tongue every 5 (five) minutes as needed for chest pain.   Yes Historical Provider, MD  pantoprazole (PROTONIX) 40 MG tablet Take 40 mg by mouth daily.   Yes Historical Provider, MD  simvastatin (ZOCOR) 40 MG tablet Take 40 mg by mouth daily.   Yes Historical Provider, MD  benzonatate (TESSALON PERLES) 100 MG capsule Take 1 capsule (100 mg total) by mouth 3 (three) times daily as needed for cough. Patient not taking: Reported on 12/17/2016 05/14/15   Marylene Land, NP  HYDROcodone-acetaminophen (NORCO/VICODIN) 5-325 MG tablet Take 1 tablet by mouth every 6 (six) hours as needed for moderate pain. Patient not taking: Reported on 12/17/2016 07/18/16   Johnn Hai, PA-C    Inpatient Medications:  . aspirin EC  81 mg Oral Daily  . carvedilol  12.5 mg Oral BID WC  . enoxaparin (LOVENOX)  injection  40 mg Subcutaneous Q24H  . insulin aspart  0-9 Units Subcutaneous TID WC  . insulin glargine  10 Units Subcutaneous QHS  . isosorbide mononitrate  120 mg Oral Daily  . lisinopril  40 mg Oral Daily  . pantoprazole  40 mg Oral QAC breakfast  . simvastatin  40 mg Oral Daily   . sodium chloride 50 mL/hr at 12/17/16 1551    Allergies:  Allergies  Allergen Reactions  . Contrast Media [Iodinated Diagnostic Agents] Swelling  . Colchicine Other (See Comments)    Gallbladder Problems  . Etodolac Swelling  . Indomethacin Swelling  . Oxaprozin Swelling    Social History   Social History  . Marital status: Married    Spouse name: N/A  . Number of children: N/A  . Years of education: N/A   Occupational History  . Not on file.   Social History Main Topics  . Smoking status: Never Smoker  . Smokeless tobacco: Never Used  . Alcohol use No  . Drug use: No  . Sexual activity: Not on file   Other Topics Concern  . Not on file   Social History Narrative  . No narrative on file     History reviewed. No pertinent family history.   Review of Systems: Review of Systems  Constitutional: Negative.   Eyes:       "Vision went black"  Respiratory: Negative.   Cardiovascular: Negative.   Gastrointestinal: Positive for abdominal pain.  Musculoskeletal: Negative.  Neurological: Positive for dizziness.  Psychiatric/Behavioral: Negative.   All other systems reviewed and are negative.   Labs: CBC  Recent Labs  12/17/16 1107 12/18/16 0152  WBC 10.9 8.5  HGB 10.5* 10.1*  HCT 33.7* 32.4*  MCV 65.8* 66.7*  PLT 331 154   Basic Metabolic Panel  Recent Labs  12/17/16 1107 12/18/16 0152  NA 136 141  K 4.0 4.0  CL 107 112*  CO2 20* 24  GLUCOSE 207* 95  BUN 22* 20  CREATININE 1.33* 1.12*  CALCIUM 9.0 8.7*   Liver Function Tests No results for input(s): AST, ALT, ALKPHOS, BILITOT, PROT, ALBUMIN in the last 72 hours. No results for input(s): LIPASE, AMYLASE in  the last 72 hours. Cardiac Enzymes  Recent Labs  12/17/16 1405 12/17/16 2019 12/18/16 0152  TROPONINI <0.03 <0.03 <0.03   BNP Invalid input(s): POCBNP D-Dimer No results for input(s): DDIMER in the last 72 hours. Hemoglobin A1C No results for input(s): HGBA1C in the last 72 hours. Fasting Lipid Panel No results for input(s): CHOL, HDL, LDLCALC, TRIG, CHOLHDL, LDLDIRECT in the last 72 hours. Thyroid Function Tests No results for input(s): TSH, T4TOTAL, T3FREE, THYROIDAB in the last 72 hours.  Invalid input(s): FREET3  Radiology/Studies:  No results found.  EKG: Normal sinus rhythm with no significant ST or T-wave changes, borderline interventricular conduction delay  EKG lab work, chest x-ray, echocardiogram reviewed independently by myself  Weights: Filed Weights   12/17/16 1106 12/18/16 0456  Weight: 202 lb (91.6 kg) 204 lb 3.2 oz (92.6 kg)     Physical Exam:Telemetry showing normal sinus rhythm Blood pressure 140/62, pulse 72, temperature 98.2 F (36.8 C), temperature source Oral, resp. rate 18, height 5\' 4"  (1.626 m), weight 204 lb 3.2 oz (92.6 kg), SpO2 99 %. Body mass index is 35.05 kg/m. GEN: Well nourished, well developed, in no acute distress, obese HEENT: Grossly normal.  Neck: Supple, no JVD, carotid bruits, or masses. Cardiac: RRR, no murmurs, rubs, or gallops. No clubbing, cyanosis, edema.  Radials/DP/PT 2+ and equal bilaterally.  Respiratory:  Respirations regular and unlabored, clear to auscultation bilaterally. GI: Soft, nontender, nondistended, BS + x 4. MS: no deformity or atrophy. Skin: warm and dry, no rash. Neuro:  Strength and sensation are intact. Psych: AAOx3.  Normal affect.    Assessment and Plan:   -Near-syncope/dizziness Likely secondary to vasovagal etiology, exacerbated by GI disorder History of partial colectomy per the patient, often has waxing waning diarrhea with constipation, urgency. Episode of dizziness with vision disorder  happened in the setting of GI bowel movement urgency, She's been on telemetry since arrival with no arrhythmia Cardiac enzymes negative Last 2 cardiac catheterizations with nonobstructive disease, done at [facility] Prior to event had no symptoms concerning for ischemia No other neurologic manifestations concerning for TIA or stroke Creatinine elevated on arrival concerning for prerenal state which may have exacerbated her symptoms She is currently not on any diuretics. Reports that she tries to stay hydrated but has frequent loose bowel movements from colon Surgery, likely leading to dehydration Stressed importance of staying hydrated through the summer -We have recommended to her that she contact cardiology at [facility] for post hospital follow-up.  Would continue current medications for now  ----CAD, previous MI Prior stents 2 to the LAD, stent 2 to the RCA. We have stressed importance of compliance with her cholesterol medication Negative cardiac enzymes, EKG essentially benign  ----Vision loss Likely in conjunction with vasovagal etiology, dizziness Quickly improved when she was evaluated by  EMTs, likely placed in supine position I suspect with improvement of her blood pressure.   ----Acute renal failure Improved with hydration, normal saline  -----Hyperlipidemia Recent LDL indicate she is not close to goal Will stress the importance of compliance with her Crestor 40 mill grams daily, it would appear that Zetia was added recently by outpatient cardiology  -----s/p colon surgery She reports frequent abdominal discomfort, diarrhea, periodic constipation Likely leading to above.  Stressed importance of laying supine when she is dizzy concerning for orthostasis    Total encounter time more than 110 minutes  Greater than 50% was spent in counseling and coordination of care with the patient   Signed, Esmond Plants, MD, Ph.D [facility] 12/18/2016

## 2018-12-09 ENCOUNTER — Emergency Department
Admission: EM | Admit: 2018-12-09 | Discharge: 2018-12-10 | Disposition: A | Discharge status: Home / Self Care | Payer: Medicare Other | Attending: Emergency Medicine | Admitting: Emergency Medicine

## 2018-12-09 ENCOUNTER — Other Ambulatory Visit: Payer: Self-pay

## 2018-12-09 DIAGNOSIS — Z79899 Other long term (current) drug therapy: Secondary | ICD-10-CM | POA: Insufficient documentation

## 2018-12-09 DIAGNOSIS — K5900 Constipation, unspecified: Secondary | ICD-10-CM | POA: Diagnosis not present

## 2018-12-09 DIAGNOSIS — I2510 Atherosclerotic heart disease of native coronary artery without angina pectoris: Secondary | ICD-10-CM | POA: Diagnosis not present

## 2018-12-09 DIAGNOSIS — Z794 Long term (current) use of insulin: Secondary | ICD-10-CM | POA: Diagnosis not present

## 2018-12-09 DIAGNOSIS — M545 Low back pain, unspecified: Secondary | ICD-10-CM

## 2018-12-09 DIAGNOSIS — Z955 Presence of coronary angioplasty implant and graft: Secondary | ICD-10-CM | POA: Diagnosis not present

## 2018-12-09 DIAGNOSIS — R52 Pain, unspecified: Secondary | ICD-10-CM

## 2018-12-09 DIAGNOSIS — Z7982 Long term (current) use of aspirin: Secondary | ICD-10-CM | POA: Diagnosis not present

## 2018-12-09 DIAGNOSIS — N189 Chronic kidney disease, unspecified: Secondary | ICD-10-CM | POA: Diagnosis not present

## 2018-12-09 DIAGNOSIS — I129 Hypertensive chronic kidney disease with stage 1 through stage 4 chronic kidney disease, or unspecified chronic kidney disease: Secondary | ICD-10-CM | POA: Diagnosis not present

## 2018-12-09 NOTE — ED Triage Notes (Signed)
Pt to ED via ems from home. Pt c/o lower back pain that started around 2130 tonight, Pt has extensive cardiac history, multiple Mis and stent placements. Pt denies injury or trauma to back. Pt took 4 baby asprin before calling ems. VSS.

## 2018-12-10 ENCOUNTER — Emergency Department: Payer: Medicare Other

## 2018-12-10 LAB — COMPREHENSIVE METABOLIC PANEL
ALT: 7 U/L (ref 0–44)
AST: 17 U/L (ref 15–41)
Albumin: 3.2 g/dL — ABNORMAL LOW (ref 3.5–5.0)
Alkaline Phosphatase: 84 U/L (ref 38–126)
Anion gap: 9 (ref 5–15)
BUN: 23 mg/dL (ref 8–23)
CO2: 19 mmol/L — ABNORMAL LOW (ref 22–32)
Calcium: 8.8 mg/dL — ABNORMAL LOW (ref 8.9–10.3)
Chloride: 109 mmol/L (ref 98–111)
Creatinine, Ser: 1.09 mg/dL — ABNORMAL HIGH (ref 0.44–1.00)
GFR calc Af Amer: 57 mL/min — ABNORMAL LOW (ref >60)
GFR calc non Af Amer: 49 mL/min — ABNORMAL LOW (ref >60)
Glucose, Bld: 171 mg/dL — ABNORMAL HIGH (ref 70–99)
Potassium: 3.7 mmol/L (ref 3.5–5.1)
Sodium: 137 mmol/L (ref 135–145)
Total Bilirubin: 0.4 mg/dL (ref 0.3–1.2)
Total Protein: 7 g/dL (ref 6.5–8.1)

## 2018-12-10 LAB — CBC WITH DIFFERENTIAL/PLATELET
Abs Immature Granulocytes: 0.13 10*3/uL — ABNORMAL HIGH (ref 0.00–0.07)
Basophils Absolute: 0 10*3/uL (ref 0.0–0.1)
Basophils Relative: 0 %
Eosinophils Absolute: 0 10*3/uL (ref 0.0–0.5)
Eosinophils Relative: 0 %
HCT: 32.2 % — ABNORMAL LOW (ref 36.0–46.0)
Hemoglobin: 9.1 g/dL — ABNORMAL LOW (ref 12.0–15.0)
Immature Granulocytes: 1 %
Lymphocytes Relative: 9 %
Lymphs Abs: 1 10*3/uL (ref 0.7–4.0)
MCH: 18.9 pg — ABNORMAL LOW (ref 26.0–34.0)
MCHC: 28.3 g/dL — ABNORMAL LOW (ref 30.0–36.0)
MCV: 66.9 fL — ABNORMAL LOW (ref 80.0–100.0)
Monocytes Absolute: 0.1 10*3/uL (ref 0.1–1.0)
Monocytes Relative: 1 %
Neutro Abs: 10 10*3/uL — ABNORMAL HIGH (ref 1.7–7.7)
Neutrophils Relative %: 89 %
Platelets: 357 10*3/uL (ref 150–400)
RBC: 4.81 MIL/uL (ref 3.87–5.11)
RDW: 19.5 % — ABNORMAL HIGH (ref 11.5–15.5)
WBC: 11.3 10*3/uL — ABNORMAL HIGH (ref 4.0–10.5)
nRBC: 0.2 % (ref 0.0–0.2)

## 2018-12-10 LAB — URINALYSIS, COMPLETE (UACMP) WITH MICROSCOPIC
Bilirubin Urine: NEGATIVE
Glucose, UA: NEGATIVE mg/dL
Hgb urine dipstick: NEGATIVE
Ketones, ur: NEGATIVE mg/dL
Nitrite: NEGATIVE
Protein, ur: NEGATIVE mg/dL
Specific Gravity, Urine: 1.011 (ref 1.005–1.030)
pH: 5 (ref 5.0–8.0)

## 2018-12-10 LAB — TROPONIN I: Troponin I: <0.03 ng/mL (ref <0.03)

## 2018-12-10 LAB — LIPASE, BLOOD: Lipase: 45 U/L (ref 11–51)

## 2018-12-10 MED ORDER — FENTANYL CITRATE (PF) 100 MCG/2ML IJ SOLN
25.0000 ug | Freq: Once | INTRAMUSCULAR | Status: AC
  Administered 2018-12-10: 25 ug via INTRAVENOUS
  Filled 2018-12-10: qty 2

## 2018-12-10 MED ORDER — TRAMADOL HCL 50 MG PO TABS: 50.0000 mg | ORAL_TABLET | Freq: Four times a day (QID) | ORAL | 0 refills | Status: DC | PRN

## 2018-12-10 MED ORDER — ONDANSETRON HCL 4 MG/2ML IJ SOLN
4.0000 mg | Freq: Once | INTRAMUSCULAR | Status: AC
  Administered 2018-12-10: 4 mg via INTRAVENOUS
  Filled 2018-12-10: qty 2

## 2018-12-10 MED ORDER — LACTULOSE 10 GM/15ML PO SOLN: 20.0000 g | Freq: Every day | ORAL | 0 refills | Status: DC | PRN

## 2018-12-10 MED ORDER — FOSFOMYCIN TROMETHAMINE 3 G PO PACK
3.0000 g | PACK | Freq: Once | ORAL | Status: AC
  Administered 2018-12-10: 3 g via ORAL
  Filled 2018-12-10: qty 3

## 2018-12-10 NOTE — ED Notes (Signed)
Report from gracie, rn.

## 2018-12-10 NOTE — ED Provider Notes (Signed)
[facility] Emergency Department Provider Note   ____________________________________________   First MD Initiated Contact with Patient 12/10/18 0005     (approximate)  I have reviewed the triage vital signs and the nursing notes.   HISTORY  Chief Complaint Back Pain    HPI Carly Cortez is a 78 y.o. female brought to the ED from home via EMS with a chief complaint of bilateral flank pain.  Was getting ready for bed when she had sudden onset of chills and bilateral flank pain.  History of CAD status post multiple MIs and stent placements.  Patient took 4 baby aspirin before calling EMS.  Endorses mild dry cough.  States she has been on 2 rounds of antibiotics recently for perforated eardrum.  Denies fever, chest pain, shortness of breath, abdominal pain, nausea, vomiting, dysuria.  States she has chronically loose stools.  Denies recent trauma or travel.  Denies exposure to persons known with coronavirus.       Past Medical History:  Diagnosis Date  . 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 Chronic kidney disease   . Diabetes mellitus without complication ([place])   . Gout   . High cholesterol   . Hypertension     Patient Active Problem List   Diagnosis Date Noted  . Syncope 12/17/2016    Past Surgical History:  Procedure Laterality Date  . ABDOMINAL HYSTERECTOMY    . APPENDECTOMY    . cardiac stents    . CHOLECYSTECTOMY    . HERNIA REPAIR    . PARTIAL COLECTOMY      Prior to Admission medications   Medication Sig Start Date End Date Taking? Authorizing Provider  aspirin 81 MG tablet Take 81 mg by mouth daily.    [provider]  benzonatate (TESSALON PERLES) 100 MG capsule Take 1 capsule (100 mg total) by mouth 3 (three) times daily as needed for cough. Patient not taking: Reported on 12/17/2016 05/14/15   Marylene Land, NP  carvedilol (COREG) 12.5 MG tablet Take 12.5 mg by mouth 2 (two) times daily. 12/06/16   [provider]   clobetasol ointment (TEMOVATE) 8.34 % Apply 1 application topically 2 (two) times daily.    [provider]  HYDROcodone-acetaminophen (NORCO/VICODIN) 5-325 MG tablet Take 1 tablet by mouth every 6 (six) hours as needed for moderate pain. Patient not taking: Reported on 12/17/2016 07/18/16   Letitia Neri L, PA-C  insulin glargine (LANTUS) 100 UNIT/ML injection Inject 10-15 Units into the skin at bedtime.     [provider]  isosorbide mononitrate (IMDUR) 120 MG 24 hr tablet Take 120 mg by mouth daily.    [provider]  lactulose (CHRONULAC) 10 GM/15ML solution Take 30 mLs (20 g total) by mouth daily as needed for mild constipation. 12/10/18   Paulette Blanch, MD  lisinopril (PRINIVIL,ZESTRIL) 40 MG tablet Take 40 mg by mouth daily.    [provider]  nitroGLYCERIN (NITROSTAT) 0.4 MG SL tablet Place 0.4 mg under the tongue every 5 (five) minutes as needed for chest pain.    [provider]  pantoprazole (PROTONIX) 40 MG tablet Take 40 mg by mouth daily.    [provider]  simvastatin (ZOCOR) 40 MG tablet Take 40 mg by mouth daily.    [provider]  traMADol (ULTRAM) 50 MG tablet Take 1 tablet (50 mg total) by mouth every 6 (six) hours as needed. 12/10/18   Paulette Blanch, MD    Allergies  Contrast media [iodinated diagnostic agents]; Colchicine; Etodolac; Indomethacin; and Oxaprozin  No family history on file.  Social History Social History   Tobacco Use  . Smoking status: Never Smoker  . Smokeless tobacco: Never Used  Substance Use Topics  . Alcohol use: No  . Drug use: No    Review of Systems  Constitutional: Positive for chills Eyes: No visual changes. ENT: No sore throat. Cardiovascular: Denies chest pain. Respiratory: Denies shortness of breath. Gastrointestinal: No abdominal pain.  No nausea, no vomiting.  No diarrhea.  No constipation. Genitourinary: Negative for dysuria. Musculoskeletal: Positive for back  pain. Skin: Negative for rash. Neurological: Negative for headaches, focal weakness or numbness.   ____________________________________________   PHYSICAL EXAM:  VITAL SIGNS: ED Triage Vitals [12/09/18 2356]  Enc Vitals Group     BP      Pulse      Resp      Temp 99.2 F (37.3 C)     Temp Source Oral     SpO2      Weight 210 lb (95.3 kg)     Height 5\' 6"  (1.676 m)     Head Circumference      Peak Flow      Pain Score 8     Pain Loc      Pain Edu?      Excl. in [place]?     Constitutional: Alert and oriented. Well appearing and in mild acute distress. Eyes: Conjunctivae are normal. PERRL. EOMI. Head: Atraumatic. Nose: No congestion/rhinnorhea. Mouth/Throat: Mucous membranes are moist.  Oropharynx non-erythematous. Neck: No stridor.   Cardiovascular: Normal rate, regular rhythm. Grossly normal heart sounds.  Good peripheral circulation. Respiratory: Normal respiratory effort.  No retractions. Lungs CTAB. Gastrointestinal: Soft and nontender to light or deep palpation. No distention. No abdominal bruits.  Bilateral mild CVA tenderness. Musculoskeletal: No lower extremity tenderness nor edema.  No joint effusions. Neurologic:  Normal speech and language. No gross focal neurologic deficits are appreciated.  Skin:  Skin is warm, dry and intact. No rash noted. Psychiatric: Mood and affect are normal. Speech and behavior are normal.  ____________________________________________   LABS (all labs ordered are listed, but only abnormal results are displayed)  Labs Reviewed  CBC WITH DIFFERENTIAL/PLATELET - Abnormal; Notable for the following components:      Result Value   WBC 11.3 (*)    Hemoglobin 9.1 (*)    HCT 32.2 (*)    MCV 66.9 (*)    MCH 18.9 (*)    MCHC 28.3 (*)    RDW 19.5 (*)    Neutro Abs 10.0 (*)    Abs Immature Granulocytes 0.13 (*)    All other components within normal limits  COMPREHENSIVE METABOLIC PANEL - Abnormal; Notable for the following components:    CO2 19 (*)    Glucose, Bld 171 (*)    Creatinine, Ser 1.09 (*)    Calcium 8.8 (*)    Albumin 3.2 (*)    GFR calc non Af Amer 49 (*)    GFR calc Af Amer 57 (*)    All other components within normal limits  URINALYSIS, COMPLETE (UACMP) WITH MICROSCOPIC - Abnormal; Notable for the following components:   Color, Urine YELLOW (*)    APPearance CLEAR (*)    Leukocytes,Ua TRACE (*)    Bacteria, UA RARE (*)    All other components within normal limits  LIPASE, BLOOD  TROPONIN I   ____________________________________________  EKG  ED ECG REPORT I, , J,  the attending physician, personally viewed and interpreted this ECG.   Date: 12/10/2018  EKG Time: 2357  Rate: 91  Rhythm: normal EKG, normal sinus rhythm  Axis: LAD  Intervals:nonspecific intraventricular conduction delay  ST&T Change: Nonspecific  ____________________________________________  RADIOLOGY  ED MD interpretation: X-rays unremarkable other than moderate hiatal hernia and moderate colonic stool burden; CT demonstrates stable changes  Official radiology report(s): Dg Lumbar Spine 2-3 Views  Result Date: 12/10/2018 CLINICAL DATA:  Back pain EXAM: LUMBAR SPINE - 2-3 VIEW COMPARISON:  None. FINDINGS: There is no evidence of lumbar spine fracture. Alignment is normal. There is multilevel degenerative disc disease, greatest at L2-3. Mild L5-S1 facet hypertrophy. IMPRESSION: Mild lumbar degenerative disc disease without acute abnormality. Electronically Signed   By: Ulyses Jarred M.D.   On: 12/10/2018 01:03   Dg Abd Acute W/chest  Result Date: 12/10/2018 CLINICAL DATA:  Back pain. EXAM: DG ABDOMEN ACUTE W/ 1V CHEST COMPARISON:  Chest radiograph 11137 FINDINGS: Upper normal heart size. Moderate to large hiatal hernia. No acute airspace disease. No pleural fluid. No bowel dilatation to suggest obstruction. No evidence of free air. Small to moderate colonic stool burden. Surgical clips project throughout the abdomen.  Cholecystectomy clips in the right upper quadrant. Vascular calcifications without visualized radiopaque calculi. Lumbar spine better assessed on concurrent lumbar spine radiographs. Tacks in the pubic bone may be due to sling procedure. IMPRESSION: 1. Normal bowel gas pattern. Small to moderate colonic stool burden. 2. Moderate to large hiatal hernia. Electronically Signed   By: Keith Rake M.D.   On: 12/10/2018 01:04   Ct Renal Stone Study  Result Date: 12/10/2018 CLINICAL DATA:  Bilateral flank pain. EXAM: CT ABDOMEN AND PELVIS WITHOUT CONTRAST TECHNIQUE: Multidetector CT imaging of the abdomen and pelvis was performed following the standard protocol without IV contrast. COMPARISON:  Radiographs earlier this day, CT 05/16/2011 FINDINGS: Lower chest: No pleural fluid or consolidation. There are coronary artery calcifications. Moderate to large hiatal hernia. 7 mm right lower lobe pulmonary nodule image 11 series 4, unchanged from 2012 and considered benign. Hepatobiliary: No focal hepatic abnormality. Clips in the gallbladder fossa postcholecystectomy. No biliary dilatation. Pancreas: No ductal dilatation or inflammation. Fatty atrophy. Spleen: Normal in size without focal abnormality. Adrenals/Urinary Tract: No adrenal nodule. No hydronephrosis or perinephric edema. No renal stones. Both ureters are decompressed without stones along the course. Urinary bladder is partially distended, no bladder stone or wall thickening. Stomach/Bowel: Moderate to large hiatal hernia, greater than 50% of the stomach is intrathoracic. Duodenal diverticulum without inflammation. No small bowel wall thickening, obstruction, or inflammatory change prior appendectomy with sutures at the base of the cecum. High-riding cecum in the right mid abdomen. Small to moderate colonic stool burden. Mild distal colonic diverticulosis without acute diverticulitis. Vascular/Lymphatic: Aortic and branch atherosclerosis. No aneurysm. No  enlarged lymph nodes in the abdomen or pelvis. Reproductive: Post hysterectomy. Quiescent ovaries. No adnexal mass. Other: Postsurgical change in the anterior abdominal wall. No ascites or free air. No intra-abdominal fluid collection. Musculoskeletal: Scattered degenerative disc disease and facet arthropathy throughout the thoracolumbar spine. Mixed lucent and sclerotic lesion in the mid sacrum, this is reported on 03/01/2016 abdominopelvic CT at [facility], and partially included in 2012 CT and is likely benign. IMPRESSION: 1. No renal stones or obstructive uropathy. No acute abnormality in the abdomen/pelvis. 2. Moderate to large hiatal hernia. 3. Colonic diverticulosis without acute inflammation. 4. Additional stable chronic findings as described. Aortic Atherosclerosis (ICD10-I70.0). Electronically Signed   By: Keith Rake  M.D.   On: 12/10/2018 02:51    ____________________________________________   PROCEDURES  Procedure(s) performed (including Critical Care):  Procedures  CRITICAL CARE Performed by: Paulette Blanch   Total critical care time: 30 minutes  Critical care time was exclusive of separately billable procedures and treating other patients.  Critical care was necessary to treat or prevent imminent or life-threatening deterioration.  Critical care was time spent personally by me on the following activities: development of treatment plan with patient and/or surrogate as well as nursing, discussions with consultants, evaluation of patient's response to treatment, examination of patient, obtaining history from patient or surrogate, ordering and performing treatments and interventions, ordering and review of laboratory studies, ordering and review of radiographic studies, pulse oximetry and re-evaluation of patient's condition. ____________________________________________   INITIAL IMPRESSION / ASSESSMENT AND PLAN / ED COURSE  As part of my medical decision making, I reviewed the  following data within the [place] notes reviewed and incorporated, Labs reviewed, EKG interpreted, Old chart reviewed, Radiograph reviewed and Notes from prior ED visits        78 year old female with extensive cardiac history who presents with bilateral nontraumatic flank pain.  Differential diagnosis includes but is not limited to musculoskeletal pain, renal colic, pyelonephritis, pancreatitis, etc.  Carly Cortez was evaluated in Emergency Department on 12/10/2018 for the symptoms described in the history of present illness. She was evaluated in the context of the global COVID-19 pandemic, which necessitated consideration that the patient might be at risk for infection with the SARS-CoV-2 virus that causes COVID-19. Institutional protocols and algorithms that pertain to the evaluation of patients at risk for COVID-19 are in a state of rapid change based on information released by regulatory bodies including the CDC and federal and state organizations. These policies and algorithms were followed during the patient's care in the ED.  Will obtain cardiac panel, UA, imaging studies and reassess.   Clinical Course as of Dec 09 556  Mon Dec 10, 2018  0316 Patient reports significant improvement in pain.  Updated her on CT results.  Patient has taken amoxicillin and azithromycin within the last several weeks for ear infections.  Will give a one-time dose of fosfomycin and add urine culture.  Will prescribe tramadol for likely musculoskeletal back pain, and lactulose for moderate stool burden.  Strict return precautions given.  Patient verbalizes understanding agrees with plan of care.   [JS]    Clinical Course User Index [JS] Paulette Blanch, MD     ____________________________________________   FINAL CLINICAL IMPRESSION(S) / ED DIAGNOSES  Final diagnoses:  Acute midline low back pain without sciatica  Constipation, unspecified constipation type     ED Discharge  Orders         Ordered    traMADol (ULTRAM) 50 MG tablet  Every 6 hours PRN     12/10/18 0320    lactulose (CHRONULAC) 10 GM/15ML solution  Daily PRN     12/10/18 0320           Note:  This document was prepared using Dragon voice recognition software and may include unintentional dictation errors.   Paulette Blanch, MD 12/10/18 [phone]

## 2018-12-10 NOTE — Discharge Instructions (Addendum)
1.  You may take Lactulose as needed for bowel movements. 2.  You may take Tramadol as needed for pain. 3.  Return to the ER for worsening symptoms, persistent vomiting, difficulty breathing or other concerns.

## 2018-12-10 NOTE — ED Notes (Signed)
Pt assisted with bed pan

## 2018-12-10 NOTE — ED Notes (Signed)
Patient transported to X-ray 

## 2018-12-25 ENCOUNTER — Encounter: Payer: Self-pay | Admitting: Emergency Medicine

## 2018-12-25 ENCOUNTER — Ambulatory Visit
Admission: EM | Admit: 2018-12-25 | Discharge: 2018-12-25 | Disposition: A | Discharge status: Home / Self Care | Payer: Medicare Other | Attending: Family Medicine | Admitting: Family Medicine

## 2018-12-25 ENCOUNTER — Other Ambulatory Visit: Payer: Self-pay

## 2018-12-25 DIAGNOSIS — H9201 Otalgia, right ear: Secondary | ICD-10-CM

## 2018-12-25 DIAGNOSIS — I10 Essential (primary) hypertension: Secondary | ICD-10-CM | POA: Diagnosis not present

## 2018-12-25 MED ORDER — TRAMADOL HCL 50 MG PO TABS: 50.0000 mg | ORAL_TABLET | Freq: Two times a day (BID) | ORAL | 0 refills | Status: DC | PRN

## 2018-12-25 NOTE — ED Provider Notes (Signed)
[facility]    CSN: [id number] Arrival date & time: 12/25/18  1518  History   Chief Complaint Chief Complaint  Patient presents with  . Otalgia   HPI  78 year old female presents with right ear pain.  Patient was seen by her primary care physician in March.  Was diagnosed with otitis media and had a tympanic membrane rupture.  Patient states that she was treated with antibiotics.  Patient states that her pain has improved but then worsened again recently.  She states she continues to have pain.  Worse at night.  No reports of drainage from the ear.  No respiratory symptoms at this time.  Patient has not seen ENT.  No known relieving factors.  No other associated symptoms.  No other complaints.  Hx review and updated as below. Past Medical History:  Diagnosis Date  . 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 Chronic kidney disease   . Diabetes mellitus without complication ([place])   . Gout   . High cholesterol   . Hypertension    Patient Active Problem List   Diagnosis Date Noted  . Syncope 12/17/2016   Past Surgical History:  Procedure Laterality Date  . ABDOMINAL HYSTERECTOMY    . APPENDECTOMY    . cardiac stents    . CHOLECYSTECTOMY    . HERNIA REPAIR    . PARTIAL COLECTOMY     OB History   No obstetric history on file.    Home Medications    Prior to Admission medications   Medication Sig Start Date End Date Taking? Authorizing Provider  aspirin 81 MG tablet Take 81 mg by mouth daily.   Yes [provider]  carvedilol (COREG) 12.5 MG tablet Take 12.5 mg by mouth 2 (two) times daily. 12/06/16  Yes [provider]  clobetasol ointment (TEMOVATE) 3.53 % Apply 1 application topically 2 (two) times daily.   Yes [provider]  insulin glargine (LANTUS) 100 UNIT/ML injection Inject 10-15 Units into the skin at bedtime.    Yes [provider]  isosorbide mononitrate (IMDUR) 120 MG 24 hr tablet Take 120 mg by mouth daily.   Yes [provider]  lactulose (CHRONULAC) 10 GM/15ML solution Take 30 mLs (20 g total) by mouth daily as needed for mild constipation. 12/10/18  Yes Paulette Blanch, MD  lisinopril (PRINIVIL,ZESTRIL) 40 MG tablet Take 40 mg by mouth daily.   Yes [provider]  nitroGLYCERIN (NITROSTAT) 0.4 MG SL tablet Place 0.4 mg under the tongue every 5 (five) minutes as needed for chest pain.   Yes [provider]  pantoprazole (PROTONIX) 40 MG tablet Take 40 mg by mouth daily.   Yes [provider]  simvastatin (ZOCOR) 40 MG tablet Take 40 mg by mouth daily.   Yes [provider]  traMADol (ULTRAM) 50 MG tablet Take 1 tablet (50 mg total) by mouth every 12 (twelve) hours as needed for moderate pain or severe pain. 12/25/18   Coral Spikes, DO   Social History Social History   Tobacco Use  . Smoking status: Never Smoker  . Smokeless tobacco: Never Used  Substance Use Topics  . Alcohol use: No  . Drug use: No     Allergies   Contrast media [iodinated diagnostic agents]; Colchicine; Etodolac; Indomethacin; and Oxaprozin   Review of Systems Review of Systems  HENT: Positive for ear pain.   Neurological:       Slow, dysarthric speech.   Physical Exam  Triage Vital Signs ED Triage Vitals  Enc Vitals Group     BP 12/25/18 1536 (!) 142/75     Pulse Rate 12/25/18 1536 81     Resp 12/25/18 1536 18     Temp 12/25/18 1536 97.6 F (36.4 C)     Temp Source 12/25/18 1536 Oral     SpO2 12/25/18 1536 97 %     Weight 12/25/18 1537 210 lb (95.3 kg)     Height 12/25/18 1537 5\' 6"  (1.676 m)     Head Circumference --      Peak Flow --      Pain Score 12/25/18 1537 8     Pain Loc --      Pain Edu? --      Excl. in [place]? --    Updated Vital Signs BP (!) 142/75 (BP Location: Right Arm)   Pulse 81   Temp 97.6 F (36.4 C) (Oral)   Resp 18   Ht 5\' 6"  (1.676 m)   Wt 95.3 kg   SpO2 97%   BMI 33.89 kg/m     Physical Exam Constitutional:      General: She is not in acute  distress.    Appearance: Normal appearance. She is obese.  HENT:     Head: Normocephalic and atraumatic.     Ears:     Comments: There is no evidence of TM rupture.  There is no scarring.  Normal TMs bilaterally. Eyes:     General:        Right eye: No discharge.        Left eye: No discharge.     Conjunctiva/sclera: Conjunctivae normal.  Cardiovascular:     Rate and Rhythm: Normal rate and regular rhythm.  Pulmonary:     Effort: Pulmonary effort is normal. No respiratory distress.  Neurological:     Mental Status: She is alert.     Comments: Dysarthria noted.   Psychiatric:        Mood and Affect: Mood normal.        Behavior: Behavior normal.    UC Treatments / Results  Labs (all labs ordered are listed, but only abnormal results are displayed) Labs Reviewed - No data to display  EKG None  Radiology No results found.  Procedures Procedures (including critical care time)  Medications Ordered in UC Medications - No data to display  Initial Impression / Assessment and Plan / UC Course  I have reviewed the triage vital signs and the nursing notes.  Pertinent labs & imaging results that were available during my care of the patient were reviewed by me and considered in my medical decision making (see chart for details).    78 year old female presents with ear pain.  Normal TMs exam.  Tramadol given for pain.  Advised that she needs to see ENT.  Final Clinical Impressions(s) / UC Diagnoses   Final diagnoses:  Right ear pain     Discharge Instructions     Pain medication as directed.  Call ENT for an appt - [phone]  Dr. Lacinda Axon   ED Prescriptions    Medication Sig Dispense Auth. Provider   traMADol (ULTRAM) 50 MG tablet  (Status: Discontinued) Take 1 tablet (50 mg total) by mouth every 12 (twelve) hours as needed for moderate pain or severe pain. 10 tablet Iness Pangilinan G, DO   traMADol (ULTRAM) 50 MG tablet Take 1 tablet (50 mg total) by mouth every 12  (twelve) hours as needed for moderate  pain or severe pain. 10 tablet Coral Spikes, DO     Controlled Substance Prescriptions [place] Controlled Substance Registry consulted? Not Applicable   Coral Spikes, DO 12/25/18 1650

## 2018-12-25 NOTE — Discharge Instructions (Signed)
Pain medication as directed.  Call ENT for an appt - [phone]  Dr. Lacinda Axon

## 2018-12-25 NOTE — ED Triage Notes (Signed)
Patient c/o right ear pain since March. She states she had a ruptured ear drum in March and was on 2 different antibiotics. Patient has continued to have pain.

## 2019-05-25 ENCOUNTER — Ambulatory Visit (INDEPENDENT_AMBULATORY_CARE_PROVIDER_SITE_OTHER): Payer: Medicare Other

## 2019-05-25 ENCOUNTER — Other Ambulatory Visit: Payer: Self-pay

## 2019-05-25 ENCOUNTER — Ambulatory Visit
Admission: EM | Admit: 2019-05-25 | Discharge: 2019-05-25 | Disposition: A | Discharge status: Home / Self Care | Payer: Medicare Other | Attending: Internal Medicine | Admitting: Internal Medicine

## 2019-05-25 ENCOUNTER — Encounter: Payer: Self-pay | Admitting: Internal Medicine

## 2019-05-25 DIAGNOSIS — L0100 Impetigo, unspecified: Secondary | ICD-10-CM | POA: Diagnosis not present

## 2019-05-25 DIAGNOSIS — M7031 Other bursitis of elbow, right elbow: Secondary | ICD-10-CM | POA: Diagnosis not present

## 2019-05-25 DIAGNOSIS — M25521 Pain in right elbow: Secondary | ICD-10-CM

## 2019-05-25 MED ORDER — METHYLPREDNISOLONE SODIUM SUCC 40 MG IJ SOLR
40.0000 mg | Freq: Once | INTRAMUSCULAR | Status: AC
  Administered 2019-05-25: 40 mg via INTRAMUSCULAR

## 2019-05-25 MED ORDER — MUPIROCIN 2 % EX OINT: 1.0000 "application " | TOPICAL_OINTMENT | Freq: Three times a day (TID) | CUTANEOUS | 0 refills | Status: AC

## 2019-05-25 NOTE — Discharge Instructions (Signed)
Your xray shows some arthritis, but that is all I see. If the radiologist sees something I missed, we will call you.  As the pain gets better, work on straightening your elbow and flexing it as well. Follow up with [facility] next week if you pain and the stiffness does not improve.  Cover the abdominal area of infection, so you dont scratch it.

## 2019-05-25 NOTE — ED Triage Notes (Signed)
Patient states right arm painful ROM can't lift her arm thinks radiation from cancer is causing this pain. She also has rash in LLQ abdominal area was treated for yeast infection.

## 2019-05-25 NOTE — ED Provider Notes (Signed)
[facility]    CSN: [id number] Arrival date & time: 05/25/19  1028      History   Chief Complaint Chief Complaint  Patient presents with   Arm Pain    HPI Carly Cortez is a 78 y.o. female. who present with pain of L elbow and woke up this way 2 days ago. She had been having radiation x 7 weeks. The oncologist looked at her elbow yesterday and told her could be the radiation is causing her to have flair of her arthritis and should FU with PCP Monday, but her pain is so bad and could not sleep last night, she came today. She denies injuring herself. She is unable to straighten her elbow fully. The pain is throbbing and constant. Pain is located on posterior and lateral elbow. Pain is provoked with movement and palpation. Heat and elevation has  helped some, but has not resolved. Has taken her Norco which eased the pain a little.  Has hx of or vaginal and urethra cancer.   2- Onset of L lower abd pain and itching x 2 months. Looks like when she gets yeast infections.     Past Medical History:  Diagnosis Date   Cancer [facility])    vaginal   Chronic kidney disease    Diabetes mellitus without complication ([place])    Gout    High cholesterol    Hypertension     Patient Active Problem List   Diagnosis Date Noted   Syncope 12/17/2016    Past Surgical History:  Procedure Laterality Date   ABDOMINAL HYSTERECTOMY     APPENDECTOMY     cardiac stents     CHOLECYSTECTOMY     HERNIA REPAIR     PARTIAL COLECTOMY      OB History   No obstetric history on file.      Home Medications    Prior to Admission medications   Medication Sig Start Date End Date Taking? Authorizing Provider  aspirin 81 MG tablet Take 81 mg by mouth daily.   Yes [provider]  carvedilol (COREG) 12.5 MG tablet Take 12.5 mg by mouth 2 (two) times daily. 12/06/16  Yes [provider]  clobetasol ointment (TEMOVATE) [id number] % Apply 1 application topically 2 (two)  times daily.   Yes [provider]  insulin glargine (LANTUS) 100 UNIT/ML injection Inject 10-15 Units into the skin at bedtime.    Yes [provider]  isosorbide mononitrate (IMDUR) 120 MG 24 hr tablet Take 120 mg by mouth daily.   Yes [provider]  lactulose (CHRONULAC) 10 GM/15ML solution Take 30 mLs (20 g total) by mouth daily as needed for mild constipation. 12/10/18  Yes Paulette Blanch, MD  lisinopril (PRINIVIL,ZESTRIL) 40 MG tablet Take 40 mg by mouth daily.   Yes [provider]  nitroGLYCERIN (NITROSTAT) 0.4 MG SL tablet Place 0.4 mg under the tongue every 5 (five) minutes as needed for chest pain.   Yes [provider]  pantoprazole (PROTONIX) 40 MG tablet Take 40 mg by mouth daily.   Yes [provider]  simvastatin (ZOCOR) 40 MG tablet Take 40 mg by mouth daily.   Yes [provider]  traMADol (ULTRAM) 50 MG tablet Take 1 tablet (50 mg total) by mouth every 12 (twelve) hours as needed for moderate pain or severe pain. 12/25/18  Yes Cook, Jayce G, DO  mupirocin ointment (BACTROBAN) 2 % Place 1 application into the nose 3 (three) times daily  for 10 days. 05/25/19 06/04/19  Rodriguez-Southworth, Sunday Spillers, PA-C    Family History History reviewed. No pertinent family history.  Social History Social History   Tobacco Use   Smoking status: Never Smoker   Smokeless tobacco: Never Used  Substance Use Topics   Alcohol use: No   Drug use: No     Allergies   Contrast media [iodinated diagnostic agents], Colchicine, Etodolac, Indomethacin, and Oxaprozin   Review of Systems Review of Systems  Musculoskeletal: Positive for arthralgias and joint swelling.  Skin: Positive for rash and wound.       On L abdomen  Neurological: Positive for weakness and numbness.     Physical Exam Triage Vital Signs ED Triage Vitals  Enc Vitals Group     BP 05/25/19 1047 115/62     Pulse Rate 05/25/19 1047 74     Resp --      Temp  05/25/19 1047 98.6 F (37 C)     Temp Source 05/25/19 1047 Oral     SpO2 05/25/19 1047 96 %     Weight 05/25/19 1045 200 lb (90.7 kg)     Height 05/25/19 1045 5\' 6"  (1.676 m)     Head Circumference --      Peak Flow --      Pain Score 05/25/19 1044 8     Pain Loc --      Pain Edu? --      Excl. in [place]? --    No data found.  Updated Vital Signs BP 115/62 (BP Location: Left Arm)    Pulse 74    Temp 98.6 F (37 C) (Oral)    Ht 5\' 6"  (1.676 m)    Wt 200 lb (90.7 kg)    SpO2 96%    BMI 32.28 kg/m   Visual Acuity Right Eye Distance:   Left Eye Distance:   Bilateral Distance:    Right Eye Near:   Left Eye Near:    Bilateral Near:     Physical Exam Cardiovascular:     Pulses: Normal pulses.  Pulmonary:     Effort: Pulmonary effort is normal.  Musculoskeletal:        General: Swelling and tenderness present. No deformity or signs of injury.     Comments: L ELBOW- has moderate tenderness on lateral epicondyle which is tender, but not red or hot. She also has tenderness on posterior elbow, but not condyle region. She is unable to fully extend it or flex it or supinate it well due to stiffness and pain. She states this is new in the past 2 days.   Skin:    General: Skin is warm and dry.     Findings: Erythema present.     Comments: L ABD- has 2 honey crusted wounds, one is linear of about 2 cm xx 1/2 cm, and the other 1x1 cm. There is mild erythema around it. No streaking or satelite lesions noted.   Neurological:     Mental Status: She is oriented to person, place, and time.  Psychiatric:        Mood and Affect: Mood normal.        Behavior: Behavior normal.        Thought Content: Thought content normal.        Judgment: Judgment normal.    UC Treatments / Results  Labs (all labs ordered are listed, but only abnormal results are displayed) Labs Reviewed - No data to display  EKG   Radiology  I reviewed the xray myself and all I saw of OA, but no fracture. I told pt I  would call her once the radiologist reading was done. 2:53 pm  IMPRESSION: 1. Soft tissue swelling about the elbow with small elbow joint effusion. While there is no displaced fracture, there is a serpiginous lucency involving the head of the radius which could be artifactual though a nondisplaced radial head fracture could have a similar appearance. Correlation for point tenderness at this location is advised. 2. Moderate degenerative change of the radial humeral joint.  Procedures Procedures (including critical care time)  Medications Ordered in UC Medications  methylPREDNISolone sodium succinate (SOLU-MEDROL) 40 mg/mL injection 40 mg (40 mg Intramuscular Given 05/25/19 1148)    Initial Impression / Assessment and Plan / UC Course  I have reviewed the triage vital signs and the nursing notes. Pertinent  imaging results that were available during my care of the patient after she left were reviewed by me and considered in my medical decision making (see chart for details). I called pt and informed her of the final reading of the xray and told her to see ortho on Monday.     Final Clinical Impressions(s) / UC Diagnoses   Final diagnoses:  Bicipitoradial bursitis of right elbow  Impetigo     Discharge Instructions     Your xray shows some arthritis, but that is all I see. If the radiologist sees something I missed, we will call you.  As the pain gets better, work on straightening your elbow and flexing it as well. Follow up with [facility] next week if you pain and the stiffness does not improve.  Cover the abdominal area of infection, so you dont scratch it.     ED Prescriptions    Medication Sig Dispense Auth. Provider   mupirocin ointment (BACTROBAN) 2 % Place 1 application into the nose 3 (three) times daily for 10 days. 30 g Rodriguez-Southworth, Sunday Spillers, PA-C     PDMP not reviewed this encounter.   Shelby Mattocks, PA-C 05/25/19 1458

## 2019-08-23 ENCOUNTER — Ambulatory Visit: Payer: Medicare Other

## 2019-08-23 ENCOUNTER — Encounter: Payer: Self-pay | Admitting: Emergency Medicine

## 2019-08-23 ENCOUNTER — Ambulatory Visit
Admission: EM | Admit: 2019-08-23 | Discharge: 2019-08-23 | Disposition: A | Discharge status: Home / Self Care | Payer: Medicare Other | Attending: Family Medicine | Admitting: Family Medicine

## 2019-08-23 ENCOUNTER — Ambulatory Visit (INDEPENDENT_AMBULATORY_CARE_PROVIDER_SITE_OTHER): Payer: Medicare Other

## 2019-08-23 ENCOUNTER — Other Ambulatory Visit: Payer: Self-pay

## 2019-08-23 DIAGNOSIS — W19XXXA Unspecified fall, initial encounter: Secondary | ICD-10-CM

## 2019-08-23 DIAGNOSIS — T07XXXA Unspecified multiple injuries, initial encounter: Secondary | ICD-10-CM | POA: Diagnosis not present

## 2019-08-23 DIAGNOSIS — M79641 Pain in right hand: Secondary | ICD-10-CM

## 2019-08-23 DIAGNOSIS — S52501A Unspecified fracture of the lower end of right radius, initial encounter for closed fracture: Secondary | ICD-10-CM

## 2019-08-23 DIAGNOSIS — S60222A Contusion of left hand, initial encounter: Secondary | ICD-10-CM

## 2019-08-23 DIAGNOSIS — M79631 Pain in right forearm: Secondary | ICD-10-CM

## 2019-08-23 DIAGNOSIS — M25531 Pain in right wrist: Secondary | ICD-10-CM | POA: Diagnosis not present

## 2019-08-23 NOTE — ED Triage Notes (Addendum)
Patient in today c/o right arm and wrist pain after falling last night. Patient states it "rolled" the skin back on her arm, but has swelling and pain in her right wrist and forearm.

## 2019-08-23 NOTE — Discharge Instructions (Signed)
Ice. Rest. Drink plenty of fluids.  Elevate and keep in splint.  Follow-up with orthopedics this week.  See above to call to schedule appointment.  Follow up with your primary care physician this week as needed. Return to Urgent care for new or worsening concerns.

## 2019-08-23 NOTE — ED Provider Notes (Addendum)
[facility] ____________________________________________  Time seen: Approximately 2:35 PM  I have reviewed the triage vital signs and the nursing notes.   HISTORY  Chief Complaint Wrist Injury (DOI 08/22/19) and Fall  HPI Carly Cortez is a 78 y.o. female presenting for evaluation of right wrist pain.  Patient reports yesterday she was laying in bed and went to get up, but reports her feet got tangled in the covers and when she got up she tripped and fell.  States caught herself with her right wrist.  Reports right wrist pain since.  Did also obtain abrasion to right elbow but reports that does not hurt and she is feeling fine.  Denies elbow pain or decreased range of motion.  Denies pain radiation or paresthesias.  Denies head injury, chest pain, shortness of breath, dizziness, abdominal pain or other complaints.  Denies alleviating measures.  Pain is worse with direct palpation to the middle of her wrist.  Denies other complaints.  No recent sickness or fever.  Tetanus immunization within the last 10 years.   Past Medical History:  Diagnosis Date  . Cancer ([place])    vaginal  . Chronic kidney disease   . Diabetes mellitus without complication ([place])   . Gout   . High cholesterol   . Hypertension     Patient Active Problem List   Diagnosis Date Noted  . Syncope 12/17/2016    Past Surgical History:  Procedure Laterality Date  . ABDOMINAL HYSTERECTOMY    . APPENDECTOMY    . cardiac stents    . CHOLECYSTECTOMY    . HERNIA REPAIR    . PARTIAL COLECTOMY       No current facility-administered medications for this encounter.  Current Outpatient Medications:  .  aspirin 81 MG tablet, Take 81 mg by mouth daily., Disp: , Rfl:  .  carvedilol (COREG) 12.5 MG tablet, Take 12.5 mg by mouth 2 (two) times daily., Disp: , Rfl:  .  clobetasol ointment (TEMOVATE) AB-123456789 %, Apply 1 application topically 2 (two) times daily., Disp: , Rfl:  .  clopidogrel (PLAVIX) 75 MG tablet,  Take 75 mg by mouth daily., Disp: , Rfl:  .  fexofenadine (ALLEGRA) 180 MG tablet, Take by mouth., Disp: , Rfl:  .  furosemide (LASIX) 20 MG tablet, Take by mouth., Disp: , Rfl:  .  gabapentin (NEURONTIN) 300 MG capsule, Take 300 mg by mouth 3 (three) times daily., Disp: , Rfl:  .  insulin glargine (LANTUS) 100 UNIT/ML injection, Inject 10-15 Units into the skin at bedtime. , Disp: , Rfl:  .  isosorbide mononitrate (IMDUR) 120 MG 24 hr tablet, Take 120 mg by mouth daily., Disp: , Rfl:  .  lactulose (CHRONULAC) 10 GM/15ML solution, Take 30 mLs (20 g total) by mouth daily as needed for mild constipation., Disp: 120 mL, Rfl: 0 .  lisinopril (PRINIVIL,ZESTRIL) 40 MG tablet, Take 40 mg by mouth daily., Disp: , Rfl:  .  meloxicam (MOBIC) 15 MG tablet, Take 15 mg by mouth daily., Disp: , Rfl:  .  nitroGLYCERIN (NITROSTAT) 0.4 MG SL tablet, Place 0.4 mg under the tongue every 5 (five) minutes as needed for chest pain., Disp: , Rfl:  .  pantoprazole (PROTONIX) 40 MG tablet, Take 40 mg by mouth daily., Disp: , Rfl:  .  pregabalin (LYRICA) 50 MG capsule, Take by mouth., Disp: , Rfl:  .  REPATHA SURECLICK [id number] MG/ML SOAJ, Inject 1 Syringe into the skin every 14 (fourteen) days., Disp: , Rfl:  .  simvastatin (ZOCOR) 40 MG tablet, Take 40 mg by mouth daily., Disp: , Rfl:  .  traMADol (ULTRAM) 50 MG tablet, Take 1 tablet (50 mg total) by mouth every 12 (twelve) hours as needed for moderate pain or severe pain., Disp: 10 tablet, Rfl: 0  Allergies Contrast media [iodinated diagnostic agents], Colchicine, Etodolac, Indomethacin, and Oxaprozin  Family History  Problem Relation Age of Onset  . Heart attack Mother   . Diabetes Mother   . Other Father        unknown medical history    Social History Social History   Tobacco Use  . Smoking status: Never Smoker  . Smokeless tobacco: Never Used  Substance Use Topics  . Alcohol use: No  . Drug use: No    Review of Systems Constitutional: No fever ENT:  No sore throat. Cardiovascular: Denies chest pain. Respiratory: Denies shortness of breath. Gastrointestinal: No abdominal pain.  Musculoskeletal: Positive right wrist pain. Skin: Positive abrasions.   ____________________________________________   PHYSICAL EXAM:  VITAL SIGNS: ED Triage Vitals  Enc Vitals Group     BP 08/23/19 1325 130/73     Pulse Rate 08/23/19 1325 (!) 101     Resp 08/23/19 1325 18     Temp 08/23/19 1325 98.4 F (36.9 C)     Temp Source 08/23/19 1325 Oral     SpO2 08/23/19 1325 96 %     Weight 08/23/19 1326 189 lb (85.7 kg)     Height 08/23/19 1326 5\' 6"  (1.676 m)     Head Circumference --      Peak Flow --      Pain Score 08/23/19 1326 7     Pain Loc --      Pain Edu? --      Excl. in [place]? --     Constitutional: Alert and oriented. Well appearing and in no acute distress. Eyes: Conjunctivae are normal.  ENT      Head: Normocephalic and atraumatic. Cardiovascular: Normal rate, regular rhythm. Grossly normal heart sounds.  Good peripheral circulation. Respiratory: Normal respiratory effort without tachypnea nor retractions. Breath sounds are clear and equal bilaterally. No wheezes, rales, rhonchi. Musculoskeletal: Bilateral distal radial pulses equal.  Left hand third and fourth distal fingers mild tenderness direct palpation with mild ecchymosis, able to fully flex and extend.  Right distal radius and ulna mild swelling with moderate tenderness to direct palpation, pain with wrist rotation, normal distal sensation to right hand with good resisted distal flexion and extension of right hand all fingers.  Right elbow nontender but with small abrasion skin tear, well approximated, no bleeding, no erythema, no foreign body, and right elbow able to fully extend and flex. Neurologic:  Normal speech and language.  Skin:  Skin is warm, dry.  As above. Psychiatric: Mood and affect are normal. Speech and behavior are normal. Patient exhibits appropriate insight and  judgment   ___________________________________________   LABS (all labs ordered are listed, but only abnormal results are displayed)  Labs Reviewed - No data to display  RADIOLOGY  DG Forearm Right  Result Date: 08/23/2019 CLINICAL DATA:  [place].  Right forearm pain. EXAM: RIGHT FOREARM - 2 VIEW COMPARISON:  None. FINDINGS: Nondisplaced distal radius fracture is noted. No forearm fractures are identified. Degenerative changes at the radiocarpal joint with possible remote healed fracture of the radial head. IMPRESSION: Nondisplaced distal radius fracture. Advanced degenerative changes at the radiocapitellar joint and suspected remote healed radial head fracture. No acute forearm fractures. Electronically Signed  By: Marijo Sanes M.D.   On: 08/23/2019 14:29   DG Wrist Complete Right  Result Date: 08/23/2019 CLINICAL DATA:  [place] last evening and injured right wrist. EXAM: RIGHT WRIST - COMPLETE 3+ VIEW COMPARISON:  None. FINDINGS: Nondisplaced distal radius fracture. No intra-articular involvement is identified. No ulnar styloid fracture. The carpal bones are intact. Moderate to advanced degenerative changes at the [facility] joint of the thumb. IMPRESSION: Nondisplaced distal radius fracture without intra-articular involvement. Electronically Signed   By: Marijo Sanes M.D.   On: 08/23/2019 14:27   DG Hand Complete Left  Result Date: 08/23/2019 CLINICAL DATA:  [place].  Left hand pain. EXAM: LEFT HAND - COMPLETE 3+ VIEW COMPARISON:  None. FINDINGS: Diffuse osteoporosis. DIP, PIP and wrist joint degenerative changes. No definite acute hand or wrist fractures are identified. IMPRESSION: No acute left hand or wrist fractures. Electronically Signed   By: Marijo Sanes M.D.   On: 08/23/2019 14:31   ____________________________________________   PROCEDURES Procedures     INITIAL IMPRESSION / ASSESSMENT AND PLAN / ED COURSE  Pertinent labs & imaging results that were available during my care of the  patient were reviewed by me and considered in my medical decision making (see chart for details).  Well-appearing patient.  No acute distress.  Mechanical fall after tripping over her covers and landed on her right wrist.  X-rays as above, reviewed, positive for right wrist nondisplaced distal radius fracture without intra-articular involvement.  OCL volar dorsal splint applied.  Contusion injuries.  Follow-up with orthopedic in 5 days, information given.  Ice elevate and over-the-counter Tylenol as needed.  Discussed follow up and return parameters including no resolution or any worsening concerns. Patient verbalized understanding and agreed to plan.   ____________________________________________   FINAL CLINICAL IMPRESSION(S) / ED DIAGNOSES  Final diagnoses:  Closed fracture of distal end of right radius, unspecified fracture morphology, initial encounter  Multiple abrasions  Contusion of left hand, initial encounter     ED Discharge Orders    None       Note: This dictation was prepared with Dragon dictation along with smaller phrase technology. Any transcriptional errors that result from this process are unintentional.         Marylene Land, NP 08/23/19 1457

## 2019-10-28 ENCOUNTER — Emergency Department: Payer: Medicare Other

## 2019-10-28 ENCOUNTER — Other Ambulatory Visit: Payer: Self-pay

## 2019-10-28 ENCOUNTER — Encounter: Payer: Self-pay | Admitting: Emergency Medicine

## 2019-10-28 ENCOUNTER — Inpatient Hospital Stay
Admission: EM | Admit: 2019-10-28 | Discharge: 2019-10-30 | DRG: 871 | Disposition: A | Discharge status: Home Health | Payer: Medicare Other | Attending: Internal Medicine | Admitting: Internal Medicine

## 2019-10-28 DIAGNOSIS — Z791 Long term (current) use of non-steroidal anti-inflammatories (NSAID): Secondary | ICD-10-CM | POA: Diagnosis not present

## 2019-10-28 DIAGNOSIS — I2510 Atherosclerotic heart disease of native coronary artery without angina pectoris: Secondary | ICD-10-CM

## 2019-10-28 DIAGNOSIS — I129 Hypertensive chronic kidney disease with stage 1 through stage 4 chronic kidney disease, or unspecified chronic kidney disease: Secondary | ICD-10-CM | POA: Diagnosis present

## 2019-10-28 DIAGNOSIS — Z888 Allergy status to other drugs, medicaments and biological substances status: Secondary | ICD-10-CM

## 2019-10-28 DIAGNOSIS — E7800 Pure hypercholesterolemia, unspecified: Secondary | ICD-10-CM | POA: Diagnosis present

## 2019-10-28 DIAGNOSIS — E11649 Type 2 diabetes mellitus with hypoglycemia without coma: Secondary | ICD-10-CM

## 2019-10-28 DIAGNOSIS — A419 Sepsis, unspecified organism: Secondary | ICD-10-CM | POA: Diagnosis present

## 2019-10-28 DIAGNOSIS — Z955 Presence of coronary angioplasty implant and graft: Secondary | ICD-10-CM | POA: Diagnosis not present

## 2019-10-28 DIAGNOSIS — Z7982 Long term (current) use of aspirin: Secondary | ICD-10-CM

## 2019-10-28 DIAGNOSIS — Z923 Personal history of irradiation: Secondary | ICD-10-CM | POA: Diagnosis not present

## 2019-10-28 DIAGNOSIS — Z8544 Personal history of malignant neoplasm of other female genital organs: Secondary | ICD-10-CM | POA: Diagnosis not present

## 2019-10-28 DIAGNOSIS — K529 Noninfective gastroenteritis and colitis, unspecified: Secondary | ICD-10-CM

## 2019-10-28 DIAGNOSIS — R197 Diarrhea, unspecified: Secondary | ICD-10-CM

## 2019-10-28 DIAGNOSIS — E876 Hypokalemia: Secondary | ICD-10-CM | POA: Diagnosis present

## 2019-10-28 DIAGNOSIS — I10 Essential (primary) hypertension: Secondary | ICD-10-CM

## 2019-10-28 DIAGNOSIS — Z85038 Personal history of other malignant neoplasm of large intestine: Secondary | ICD-10-CM | POA: Diagnosis not present

## 2019-10-28 DIAGNOSIS — E1140 Type 2 diabetes mellitus with diabetic neuropathy, unspecified: Secondary | ICD-10-CM | POA: Diagnosis present

## 2019-10-28 DIAGNOSIS — E1165 Type 2 diabetes mellitus with hyperglycemia: Secondary | ICD-10-CM | POA: Diagnosis present

## 2019-10-28 DIAGNOSIS — E785 Hyperlipidemia, unspecified: Secondary | ICD-10-CM | POA: Diagnosis present

## 2019-10-28 DIAGNOSIS — N390 Urinary tract infection, site not specified: Secondary | ICD-10-CM

## 2019-10-28 DIAGNOSIS — N1831 Chronic kidney disease, stage 3a: Secondary | ICD-10-CM | POA: Diagnosis present

## 2019-10-28 DIAGNOSIS — K449 Diaphragmatic hernia without obstruction or gangrene: Secondary | ICD-10-CM | POA: Diagnosis present

## 2019-10-28 DIAGNOSIS — Z9049 Acquired absence of other specified parts of digestive tract: Secondary | ICD-10-CM

## 2019-10-28 DIAGNOSIS — Z794 Long term (current) use of insulin: Secondary | ICD-10-CM

## 2019-10-28 DIAGNOSIS — R6520 Severe sepsis without septic shock: Secondary | ICD-10-CM

## 2019-10-28 DIAGNOSIS — Z20822 Contact with and (suspected) exposure to covid-19: Secondary | ICD-10-CM | POA: Diagnosis present

## 2019-10-28 DIAGNOSIS — G9341 Metabolic encephalopathy: Secondary | ICD-10-CM | POA: Diagnosis present

## 2019-10-28 DIAGNOSIS — E1122 Type 2 diabetes mellitus with diabetic chronic kidney disease: Secondary | ICD-10-CM | POA: Diagnosis present

## 2019-10-28 DIAGNOSIS — Z7902 Long term (current) use of antithrombotics/antiplatelets: Secondary | ICD-10-CM | POA: Diagnosis not present

## 2019-10-28 DIAGNOSIS — G9340 Encephalopathy, unspecified: Secondary | ICD-10-CM | POA: Diagnosis not present

## 2019-10-28 DIAGNOSIS — N3000 Acute cystitis without hematuria: Secondary | ICD-10-CM | POA: Diagnosis present

## 2019-10-28 DIAGNOSIS — Z91041 Radiographic dye allergy status: Secondary | ICD-10-CM

## 2019-10-28 DIAGNOSIS — Z833 Family history of diabetes mellitus: Secondary | ICD-10-CM

## 2019-10-28 DIAGNOSIS — Z8249 Family history of ischemic heart disease and other diseases of the circulatory system: Secondary | ICD-10-CM

## 2019-10-28 LAB — CBC
HCT: 36.1 % (ref 36.0–46.0)
Hemoglobin: 10.6 g/dL — ABNORMAL LOW (ref 12.0–15.0)
MCH: 19.2 pg — ABNORMAL LOW (ref 26.0–34.0)
MCHC: 29.4 g/dL — ABNORMAL LOW (ref 30.0–36.0)
MCV: 65.5 fL — ABNORMAL LOW (ref 80.0–100.0)
Platelets: 474 10*3/uL — ABNORMAL HIGH (ref 150–400)
RBC: 5.51 MIL/uL — ABNORMAL HIGH (ref 3.87–5.11)
RDW: 19.8 % — ABNORMAL HIGH (ref 11.5–15.5)
WBC: 13.6 10*3/uL — ABNORMAL HIGH (ref 4.0–10.5)
nRBC: 0 % (ref 0.0–0.2)

## 2019-10-28 LAB — URINALYSIS, COMPLETE (UACMP) WITH MICROSCOPIC
Bilirubin Urine: NEGATIVE
Glucose, UA: 50 mg/dL — AB
Hgb urine dipstick: NEGATIVE
Ketones, ur: NEGATIVE mg/dL
Nitrite: NEGATIVE
Protein, ur: 100 mg/dL — AB
RBC / HPF: NONE SEEN RBC/hpf (ref 0–5)
Specific Gravity, Urine: 1.029 (ref 1.005–1.030)
pH: 5 (ref 5.0–8.0)

## 2019-10-28 LAB — COMPREHENSIVE METABOLIC PANEL
ALT: 7 U/L (ref 0–44)
AST: 16 U/L (ref 15–41)
Albumin: 3.6 g/dL (ref 3.5–5.0)
Alkaline Phosphatase: 89 U/L (ref 38–126)
Anion gap: 11 (ref 5–15)
BUN: 18 mg/dL (ref 8–23)
CO2: 22 mmol/L (ref 22–32)
Calcium: 8.9 mg/dL (ref 8.9–10.3)
Chloride: 103 mmol/L (ref 98–111)
Creatinine, Ser: 0.97 mg/dL (ref 0.44–1.00)
GFR calc Af Amer: >60 mL/min (ref >60)
GFR calc non Af Amer: 56 mL/min — ABNORMAL LOW (ref >60)
Glucose, Bld: 236 mg/dL — ABNORMAL HIGH (ref 70–99)
Potassium: 3.3 mmol/L — ABNORMAL LOW (ref 3.5–5.1)
Sodium: 136 mmol/L (ref 135–145)
Total Bilirubin: 0.8 mg/dL (ref 0.3–1.2)
Total Protein: 7.8 g/dL (ref 6.5–8.1)

## 2019-10-28 LAB — PROCALCITONIN: Procalcitonin: <0.1 ng/mL

## 2019-10-28 LAB — LIPASE, BLOOD: Lipase: 24 U/L (ref 11–51)

## 2019-10-28 LAB — LACTIC ACID, PLASMA
Lactic Acid, Venous: 2 mmol/L (ref 0.5–1.9)
Lactic Acid, Venous: 1.3 mmol/L (ref 0.5–1.9)

## 2019-10-28 LAB — GLUCOSE, CAPILLARY: Glucose-Capillary: 142 mg/dL — ABNORMAL HIGH (ref 70–99)

## 2019-10-28 LAB — POC SARS CORONAVIRUS 2 AG -  ED: SARS Coronavirus 2 Ag: NEGATIVE

## 2019-10-28 MED ORDER — ENOXAPARIN SODIUM 40 MG/0.4ML ~~LOC~~ SOLN
40.0000 mg | SUBCUTANEOUS | Status: DC
  Administered 2019-10-28 – 2019-10-29 (×2): 40 mg via SUBCUTANEOUS
  Filled 2019-10-28 (×2): qty 0.4

## 2019-10-28 MED ORDER — ONDANSETRON 4 MG PO TBDP
4.0000 mg | ORAL_TABLET | Freq: Once | ORAL | Status: AC | PRN
  Administered 2019-10-28: 4 mg via ORAL
  Filled 2019-10-28: qty 1

## 2019-10-28 MED ORDER — SODIUM CHLORIDE 0.9 % IV SOLN
1.0000 g | INTRAVENOUS | Status: DC
  Administered 2019-10-29: 1 g via INTRAVENOUS
  Filled 2019-10-28 (×2): qty 10

## 2019-10-28 MED ORDER — INSULIN ASPART 100 UNIT/ML ~~LOC~~ SOLN: 0.0000 [IU] | Freq: Every day | SUBCUTANEOUS | Status: DC

## 2019-10-28 MED ORDER — SODIUM CHLORIDE 0.9 % IV SOLN
1.0000 g | Freq: Once | INTRAVENOUS | Status: AC
  Administered 2019-10-28: 20:00:00 1 g via INTRAVENOUS
  Filled 2019-10-28: qty 10

## 2019-10-28 MED ORDER — ONDANSETRON HCL 4 MG/2ML IJ SOLN: 4.0000 mg | Freq: Four times a day (QID) | INTRAMUSCULAR | Status: DC | PRN

## 2019-10-28 MED ORDER — INSULIN ASPART 100 UNIT/ML ~~LOC~~ SOLN
0.0000 [IU] | Freq: Three times a day (TID) | SUBCUTANEOUS | Status: DC
  Administered 2019-10-30: 13:00:00 3 [IU] via SUBCUTANEOUS
  Filled 2019-10-28: qty 1

## 2019-10-28 MED ORDER — SODIUM CHLORIDE 0.9 % IV SOLN: INTRAVENOUS | Status: DC

## 2019-10-28 MED ORDER — ACETAMINOPHEN 325 MG PO TABS
650.0000 mg | ORAL_TABLET | Freq: Four times a day (QID) | ORAL | Status: DC | PRN
  Administered 2019-10-28: 650 mg via ORAL
  Filled 2019-10-28: qty 2

## 2019-10-28 MED ORDER — SODIUM CHLORIDE 0.9 % IV BOLUS
500.0000 mL | Freq: Once | INTRAVENOUS | Status: AC
  Administered 2019-10-28: 500 mL via INTRAVENOUS

## 2019-10-28 MED ORDER — ACETAMINOPHEN 650 MG RE SUPP: 650.0000 mg | Freq: Four times a day (QID) | RECTAL | Status: DC | PRN

## 2019-10-28 MED ORDER — SENNOSIDES-DOCUSATE SODIUM 8.6-50 MG PO TABS: 1.0000 | ORAL_TABLET | Freq: Every evening | ORAL | Status: DC | PRN

## 2019-10-28 MED ORDER — ONDANSETRON HCL 4 MG PO TABS: 4.0000 mg | ORAL_TABLET | Freq: Four times a day (QID) | ORAL | Status: DC | PRN

## 2019-10-28 MED ORDER — SODIUM CHLORIDE 0.9 % IV BOLUS (SEPSIS): 1000.0000 mL | Freq: Once | INTRAVENOUS | Status: DC

## 2019-10-28 MED ORDER — ONDANSETRON HCL 4 MG/2ML IJ SOLN
4.0000 mg | Freq: Once | INTRAMUSCULAR | Status: AC
  Administered 2019-10-28: 4 mg via INTRAVENOUS
  Filled 2019-10-28: qty 2

## 2019-10-28 NOTE — ED Notes (Signed)
[place] sent to lab

## 2019-10-28 NOTE — H&P (Signed)
History and Physical    Carly Cortez [id number] DOB: 12-29-1940 DOA: 10/28/2019  PCP: Verita Lamb, NP   Patient coming from:home I have personally briefly reviewed patient's old medical records in [place]  Chief Complaint: Nausea vomiting and diarrhea, confusion  HPI: Carly Cortez is a 79 y.o. female with medical history significant for type 2 diabetes with neuropathy, hypertension, coronary artery disease with history of stent angioplasty, remote history of colon cancer status post partial colectomy, as well as history of recurrent cancer of the vulva, completing radiation treatment in 2020, who at baseline is functionally independent, brought to the emergency room with a 3-day history of malaise,  nonbloody nonbilious vomiting up to five times daily, lower abdominal pain and frequent urination, dark in color associated with soft diarrhea, 1-2 daily, and low-grade temperature of 100.  On the day of arrival her daughter also noted increasing confusion or ' brain fog' as described by her daughter from whom I get most of the history.  There are no affected contacts and no history of ingestion of food stuff out of the ordinary.  Patient did have a sinus infection on 10/21/2018 and got a course of antibiotics and steroids.  No reports of Covid contacts, or cough or shortness of breath or chest pain. ED Course: On arrival in the emergency room she had a low-grade temperature of 99, mild tachycardia of 91, BP 130/83 and O2 sat 98% on room air.  Blood work was significant for white cell count of 13,600.  Mild hypokalemia of 3.3, but sugar mildly elevated at 236.  Lactic acid was elevated at 2.0, down to 1.3 on repeat duration.  Urinalysis showed small leukocyte esterase and was cloudy in appearance.  With Covid was negative, Covid PCR pending.  CT abdomen and pelvis showed a hiatal hernia but otherwise no acute findings.  Patient was started on IV Rocephin.  Hospitalist consulted for  admission.  By the time of my evaluation, with daughter at bedside, she stated her mother was a little clearer following initial IV hydration in the emergency room but still appeared confused and to have trouble thinking.  She stated that a couple hours ago her mother did not know where she was and kept asking.  Review of Systems: Unreliable as patient is still somewhat confused though oriented.  Past Medical History:  Diagnosis Date  . Cancer ([place])    vaginal  . Chronic kidney disease   . Diabetes mellitus without complication ([place])   . Gout   . High cholesterol   . Hypertension     Past Surgical History:  Procedure Laterality Date  . ABDOMINAL HYSTERECTOMY    . APPENDECTOMY    . cardiac stents    . CHOLECYSTECTOMY    . HERNIA REPAIR    . PARTIAL COLECTOMY       reports that she has never smoked. She has never used smokeless tobacco. She reports that she does not drink alcohol or use drugs.  Allergies  Allergen Reactions  . Contrast Media [Iodinated Diagnostic Agents] Swelling  . Colchicine Other (See Comments)    Gallbladder Problems  . Etodolac Swelling  . Indomethacin Swelling  . Oxaprozin Swelling    Family History  Problem Relation Age of Onset  . Heart attack Mother   . Diabetes Mother   . Other Father        unknown medical history     Prior to Admission medications   Medication Sig Start Date End  Date Taking? Authorizing Provider  amLODipine (NORVASC) 2.5 MG tablet Take 2.5 mg by mouth daily. 08/27/19  Yes [provider]  carvedilol (COREG) 25 MG tablet Take 12.5 mg by mouth 2 (two) times daily.    Yes [provider]  clopidogrel (PLAVIX) 75 MG tablet Take 75 mg by mouth daily. 07/23/19  Yes [provider]  furosemide (LASIX) 20 MG tablet Take 20 mg by mouth daily.    Yes [provider]  gabapentin (NEURONTIN) 300 MG capsule Take 300 mg by mouth 3 (three) times daily. 08/02/19  Yes [provider]  insulin  glargine (LANTUS) 100 UNIT/ML injection Inject 30-74 Units into the skin See admin instructions. Inject 74u under the skin every morning and inject 30u under the skin every night at bedtime   Yes [provider]  meloxicam (MOBIC) 15 MG tablet Take 15 mg by mouth daily. 08/02/19  Yes [provider]  nitroGLYCERIN (NITROSTAT) 0.4 MG SL tablet Place 0.4 mg under the tongue every 5 (five) minutes as needed for chest pain.   Yes [provider]  aspirin 81 MG tablet Take 81 mg by mouth daily.    [provider]  pantoprazole (PROTONIX) 40 MG tablet Take 40 mg by mouth daily.    [provider]  pregabalin (LYRICA) 50 MG capsule Take by mouth. 02/25/19 02/25/20  [provider]  REPATHA SURECLICK [id number] MG/ML SOAJ Inject 1 Syringe into the skin every 14 (fourteen) days. 08/02/19   [provider]  simvastatin (ZOCOR) 40 MG tablet Take 40 mg by mouth daily.    [provider]    Physical Exam: Vitals:   10/28/19 1939 10/28/19 1940 10/28/19 1957 10/28/19 2000  BP:  (!) 144/53  137/64  Pulse: 79 80 79 79  Resp: 14 15 15 15   Temp:      TempSrc:      SpO2: 96% 92% 95% 95%  Weight:      Height:         Vitals:   10/28/19 1939 10/28/19 1940 10/28/19 1957 10/28/19 2000  BP:  (!) 144/53  137/64  Pulse: 79 80 79 79  Resp: 14 15 15 15   Temp:      TempSrc:      SpO2: 96% 92% 95% 95%  Weight:      Height:        Constitutional: appears restless, answering questions after a pause, but oriented x3 Eyes: PERRL, lids and conjunctivae normal ENMT: Mucous membranes are moist.  Neck: normal, supple, no masses, no thyromegaly Respiratory: clear to auscultation bilaterally, no wheezing, no crackles. Normal respiratory effort. No accessory muscle use.  Cardiovascular: Regular rate and rhythm, no murmurs / rubs / gallops. No extremity edema. 2+ pedal pulses. No carotid bruits.  Abdomen: no tenderness, no masses palpated. No  hepatosplenomegaly. Bowel sounds positive.  Musculoskeletal: no clubbing / cyanosis. No joint deformity upper and lower extremities.  Skin: no rashes, lesions, ulcers.  Neurologic: No gross focal neurologic deficit. Psychiatric: difficult to assess as she is restless   Labs on Admission: I have personally reviewed following labs and imaging studies  CBC: Recent Labs  Lab 10/28/19 1537  WBC 13.6*  HGB 10.6*  HCT 36.1  MCV 65.5*  PLT 123XX123*   Basic Metabolic Panel: Recent Labs  Lab 10/28/19 1537  NA 136  K 3.3*  CL 103  CO2 22  GLUCOSE 236*  BUN 18  CREATININE 0.97  CALCIUM 8.9   GFR: Estimated  Creatinine Clearance: 52.3 mL/min (by C-G formula based on SCr of 0.97 mg/dL). Liver Function Tests: Recent Labs  Lab 10/28/19 1537  AST 16  ALT 7  ALKPHOS 89  BILITOT 0.8  PROT 7.8  ALBUMIN 3.6   Recent Labs  Lab 10/28/19 1537  LIPASE 24   No results for input(s): AMMONIA in the last 168 hours. Coagulation Profile: No results for input(s): INR, PROTIME in the last 168 hours. Cardiac Enzymes: No results for input(s): CKTOTAL, CKMB, CKMBINDEX, TROPONINI in the last 168 hours. BNP (last 3 results) No results for input(s): PROBNP in the last 8760 hours. HbA1C: No results for input(s): HGBA1C in the last 72 hours. CBG: No results for input(s): GLUCAP in the last 168 hours. Lipid Profile: No results for input(s): CHOL, HDL, LDLCALC, TRIG, CHOLHDL, LDLDIRECT in the last 72 hours. Thyroid Function Tests: No results for input(s): TSH, T4TOTAL, FREET4, T3FREE, THYROIDAB in the last 72 hours. Anemia Panel: No results for input(s): VITAMINB12, FOLATE, FERRITIN, TIBC, IRON, RETICCTPCT in the last 72 hours. Urine analysis:    Component Value Date/Time   COLORURINE YELLOW (A) 10/28/2019 1538   APPEARANCEUR TURBID (A) 10/28/2019 1538   LABSPEC 1.029 10/28/2019 1538   PHURINE 5.0 10/28/2019 1538   GLUCOSEU 50 (A) 10/28/2019 1538   HGBUR NEGATIVE 10/28/2019 1538    BILIRUBINUR NEGATIVE 10/28/2019 1538   KETONESUR NEGATIVE 10/28/2019 1538   PROTEINUR 100 (A) 10/28/2019 1538   NITRITE NEGATIVE 10/28/2019 1538   LEUKOCYTESUR TRACE (A) 10/28/2019 1538    Radiological Exams on Admission: CT ABDOMEN PELVIS WO CONTRAST  Result Date: 10/28/2019 CLINICAL DATA:  Nausea and vomiting for 2 days. EXAM: CT ABDOMEN AND PELVIS WITHOUT CONTRAST TECHNIQUE: Multidetector CT imaging of the abdomen and pelvis was performed following the standard protocol without IV contrast. COMPARISON:  12/10/2018 FINDINGS: Lower chest: No acute abnormality.  Large hiatal hernia. Hepatobiliary: Status post cholecystectomy. No biliary dilatation. No suspicious liver lesion. Pancreas: Fatty replacement of the pancreas. Spleen: Normal appearance of the spleen. Adrenals/Urinary Tract: No normal appearance of the adrenal glands. No kidney stones identified bilaterally. The urinary bladder is unremarkable. Stomach/Bowel: Large hiatal hernia. The small bowel loops have a normal course and caliber. Postoperative changes from right hemicolectomy identified. No bowel wall thickening, inflammation or distension identified. Sigmoid diverticulosis without acute inflammation. Vascular/Lymphatic: Aortic atherosclerosis. No aneurysm. No abdominopelvic adenopathy identified. Reproductive: Status post hysterectomy. No adnexal masses. Other: No free fluid or fluid collections identified. Musculoskeletal: Degenerative disc disease noted within the lumbar spine. IMPRESSION: 1. No acute findings within the abdomen or pelvis. 2. Hiatal hernia. 3.  Aortic Atherosclerosis (ICD10-I70.0). Electronically Signed   By: Kerby Moors M.D.   On: 10/28/2019 18:57   DG Chest Portable 1 View  Result Date: 10/28/2019 CLINICAL DATA:  Fever and shortness of breath. EXAM: PORTABLE CHEST 1 VIEW COMPARISON:  07/18/2016 FINDINGS: The heart size is normal. Aortic atherosclerosis. There is no pericardial effusion identified. Large hiatal  hernia. No superimposed airspace consolidation. IMPRESSION: 1. Lungs are clear. 2. Hiatal hernia 3.  Aortic Atherosclerosis (ICD10-I70.0). Electronically Signed   By: Kerby Moors M.D.   On: 10/28/2019 18:41    EKG: Independently reviewed.   Assessment/Plan Active Problems: Severe sepsis (HCC)   Acute metabolic encephalopathy   UTI (urinary tract infection)   Acute gastroenteritis -Patient meeting sepsis criteria with fever, tachycardia, elevated white cell count of 13,000 and lactic acidosis of 2.0. -Source appears to be UTI, possible gastroenteritis -Sepsis procalcitonin orders -Follow-up Covid PCR -Stool studies to  include C. difficile given recent treatment with antibiotics for acute sinusitis. -CT abdomen and pelvis showed no acute intra-abdominal abnormality -Continue Rocephin for suspected UTI -Follow-up cultures --Fall and aspiration precautions  Hyperglycemia in type 2 diabetes with neuropathy ([place]) -Blood sugar was 273 and might be secondary to recent prednisone treatment for sinusitis -Sliding scale insulin coverage for now    History of colon cancer status post partial colectomy   History of cancer of vulva -No acute complications evident at this time    Essential hypertension -Continue home amlodipine and see medications reconciliation  Coronary artery disease with history of stent angioplasty -Continue aspirin, Plavix, carvedilol and nitroglycerin as needed pending med reconciliation -No complaints of chest pain      DVT prophylaxis: lovenox  Code Status: full code  Family Communication: daughter, Annia Belt  Disposition Plan: Back to previous home environment Consults called: none     Athena Masse MD [facility]     10/28/2019, 8:28 PM

## 2019-10-28 NOTE — ED Notes (Signed)
First Nurse Note:  Pt in via [facility] from home with complaints of nausea, vomiting x 2 days.    Per EMS, vitals WDL.  CBG 271.  NAD noted upon arrival.

## 2019-10-28 NOTE — ED Notes (Signed)
Report called to April rn floor nurse

## 2019-10-28 NOTE — ED Notes (Signed)
Pt alert  Family with pt 

## 2019-10-28 NOTE — Plan of Care (Signed)

## 2019-10-28 NOTE — ED Provider Notes (Addendum)
[facility] Emergency Department Provider Note ____________________________________________   First MD Initiated Contact with Patient 10/28/19 1728     (approximate)  I have reviewed the triage vital signs and the nursing notes.   HISTORY  Chief Complaint Abdominal Pain  Level 5 caveat: History present illness limited due to altered mental status  HPI Carly Cortez is a 79 y.o. female with PMH as noted below who presents with multiple symptoms over the last several days.  Specifically she has had nausea and vomiting over the last 2 days associated with some generalized abdominal pain.  She is not sure about diarrhea.  She states that she has been urinating a lot.  In addition she had congestion in her chest and a low-grade temperature.  She had a telehealth visit today and was instructed to come to the ED.  She states that she feels confused.  Past Medical History:  Diagnosis Date  . Cancer ([place])    vaginal  . Chronic kidney disease   . Diabetes mellitus without complication ([place])   . Gout   . High cholesterol   . Hypertension     Patient Active Problem List   Diagnosis Date Noted  . UTI (urinary tract infection) 10/28/2019  . Acute gastroenteritis 10/28/2019  . Controlled type 2 diabetes with neuropathy ([place]) 10/28/2019  . History of partial colectomy 10/28/2019  . History of colon cancer 10/28/2019  . Syncope 12/17/2016    Past Surgical History:  Procedure Laterality Date  . ABDOMINAL HYSTERECTOMY    . APPENDECTOMY    . cardiac stents    . CHOLECYSTECTOMY    . HERNIA REPAIR    . PARTIAL COLECTOMY      Prior to Admission medications   Medication Sig Start Date End Date Taking? Authorizing Provider  aspirin 81 MG tablet Take 81 mg by mouth daily.    [provider]  carvedilol (COREG) 12.5 MG tablet Take 12.5 mg by mouth 2 (two) times daily. 12/06/16   [provider]  clobetasol ointment (TEMOVATE) [id number] % Apply 1  application topically 2 (two) times daily.    [provider]  clopidogrel (PLAVIX) 75 MG tablet Take 75 mg by mouth daily. 07/23/19   [provider]  fexofenadine (ALLEGRA) 180 MG tablet Take by mouth. 12/12/18 12/12/19  [provider]  furosemide (LASIX) 20 MG tablet Take by mouth. 07/23/19   [provider]  gabapentin (NEURONTIN) 300 MG capsule Take 300 mg by mouth 3 (three) times daily. 08/02/19   [provider]  insulin glargine (LANTUS) 100 UNIT/ML injection Inject 10-15 Units into the skin at bedtime.     [provider]  isosorbide mononitrate (IMDUR) 120 MG 24 hr tablet Take 120 mg by mouth daily.    [provider]  lactulose (CHRONULAC) 10 GM/15ML solution Take 30 mLs (20 g total) by mouth daily as needed for mild constipation. 12/10/18   Paulette Blanch, MD  lisinopril (PRINIVIL,ZESTRIL) 40 MG tablet Take 40 mg by mouth daily.    [provider]  meloxicam (MOBIC) 15 MG tablet Take 15 mg by mouth daily. 08/02/19   [provider]  nitroGLYCERIN (NITROSTAT) 0.4 MG SL tablet Place 0.4 mg under the tongue every 5 (five) minutes as needed for chest pain.    [provider]  pantoprazole (PROTONIX) 40 MG tablet Take 40 mg by mouth daily.    [provider]  pregabalin (LYRICA) 50 MG capsule Take by mouth. 02/25/19  02/25/20  [provider]  REPATHA SURECLICK [id number] MG/ML SOAJ Inject 1 Syringe into the skin every 14 (fourteen) days. 08/02/19   [provider]  simvastatin (ZOCOR) 40 MG tablet Take 40 mg by mouth daily.    [provider]  traMADol (ULTRAM) 50 MG tablet Take 1 tablet (50 mg total) by mouth every 12 (twelve) hours as needed for moderate pain or severe pain. 12/25/18   Coral Spikes, DO    Allergies Contrast media [iodinated diagnostic agents], Colchicine, Etodolac, Indomethacin, and Oxaprozin  Family History  Problem Relation Age of Onset  . Heart attack  Mother   . Diabetes Mother   . Other Father        unknown medical history    Social History Social History   Tobacco Use  . Smoking status: Never Smoker  . Smokeless tobacco: Never Used  Substance Use Topics  . Alcohol use: No  . Drug use: No    Review of Systems Level 5 caveat: Review of systems limited due to altered mental status Constitutional: Positive for low-grade fever. Cardiovascular: Denies chest pain. Respiratory: Positive for shortness of breath. Gastrointestinal: Positive for vomiting. Genitourinary: Negative for frequency. Skin: Negative for rash. Neurological: Positive for headache.   ____________________________________________   PHYSICAL EXAM:  VITAL SIGNS: ED Triage Vitals  Enc Vitals Group     BP 10/28/19 1531 130/83     Pulse Rate 10/28/19 1531 91     Resp --      Temp 10/28/19 1531 99 F (37.2 C)     Temp Source 10/28/19 1531 Oral     SpO2 10/28/19 1531 98 %     Weight 10/28/19 1534 180 lb (81.6 kg)     Height 10/28/19 1534 5\' 6"  (1.676 m)     Head Circumference --      Peak Flow --      Pain Score 10/28/19 1533 6     Pain Loc --      Pain Edu? --      Excl. in [place]? --     Constitutional: Alert and oriented, with some difficulty answering questions.  Uncomfortable appearing but in no acute distress. Eyes: Conjunctivae are normal.  Head: Atraumatic. Nose: No congestion/rhinnorhea. Mouth/Throat: Mucous membranes are dry.   Neck: Normal range of motion.  Cardiovascular: Normal rate, regular rhythm.  Good peripheral circulation. Respiratory: Normal respiratory effort.  No retractions.  Gastrointestinal: Soft with mild diffuse tenderness.  No distention.  Genitourinary: No flank tenderness. Musculoskeletal: No lower extremity edema.  Extremities warm and well perfused.  Neurologic:  Normal speech and language. No gross focal neurologic deficits are appreciated.  Skin:  Skin is warm and dry. No rash noted. Psychiatric: Mood and affect  are normal. Speech and behavior are normal.  ____________________________________________   LABS (all labs ordered are listed, but only abnormal results are displayed)  Labs Reviewed  COMPREHENSIVE METABOLIC PANEL - Abnormal; Notable for the following components:      Result Value   Potassium 3.3 (*)    Glucose, Bld 236 (*)    GFR calc non Af Amer 56 (*)    All other components within normal limits  CBC - Abnormal; Notable for the following components:   WBC 13.6 (*)    RBC 5.51 (*)    Hemoglobin 10.6 (*)    MCV 65.5 (*)    MCH 19.2 (*)    MCHC 29.4 (*)    RDW 19.8 (*)    Platelets 474 (*)  All other components within normal limits  URINALYSIS, COMPLETE (UACMP) WITH MICROSCOPIC - Abnormal; Notable for the following components:   Color, Urine YELLOW (*)    APPearance TURBID (*)    Glucose, UA 50 (*)    Protein, ur 100 (*)    Leukocytes,Ua TRACE (*)    Bacteria, UA RARE (*)    All other components within normal limits  LACTIC ACID, PLASMA - Abnormal; Notable for the following components:   Lactic Acid, Venous 2.0 (*)    All other components within normal limits  SARS CORONAVIRUS 2 (TAT 6-24 HRS)  LIPASE, BLOOD  LACTIC ACID, PLASMA  POC SARS CORONAVIRUS 2 AG -  ED   ____________________________________________  EKG  ED ECG REPORT I, Arta Silence, the attending physician, personally viewed and interpreted this ECG.  Date: 10/28/2019 EKG Time: 1831 Rate: 80 Rhythm: normal sinus rhythm QRS Axis: normal Intervals: LBBB ST/T Wave abnormalities: normal Narrative Interpretation: no evidence of acute ischemia   ____________________________________________  RADIOLOGY  CXR: No focal infiltrate or other acute abnormality CT abdomen: No acute abnormality  ____________________________________________   PROCEDURES  Procedure(s) performed: No  Procedures  Critical Care performed: No ____________________________________________   INITIAL IMPRESSION /  ASSESSMENT AND PLAN / ED COURSE  Pertinent labs & imaging results that were available during my care of the patient were reviewed by me and considered in my medical decision making (see chart for details).  80 year old female with PMH as noted above presents primarily with GI symptoms for the last 2 days with vomiting and abdominal pain.  She is unsure about diarrhea, but has been having urinary frequency as well as some chest congestion and shortness of breath.  The daughter reports a low-grade temperature to around 100.  I reviewed the past medical records in [place].  The patient's last few ED visits of last year were for unrelated symptoms.  She was last admitted in 2018  On exam today, the patient is alert and oriented but appears somewhat confused when answering certain questions and is poor with giving history.  She is uncomfortable appearing.  Her vital signs are normal except for borderline elevated temperature.  The abdomen is soft with mild diffuse tenderness.  The remainder of the exam is as described above.  Differential is broad but includes colitis, diverticulitis, or other intra-abdominal etiology, urinary tract infection, COVID-19 or other viral syndrome.  I have a low suspicion for primary cardiac etiology.  Initial lab work-up reveals elevated WBC count, and some findings on the UA to suggest UTI.  I have added on additional lab work-up, chest x-ray, CT abdomen, and we will reassess.  I anticipate the patient will likely need admission.  ----------------------------------------- 8:06 PM on 10/28/2019 -----------------------------------------  Chest x-ray and CT abdomen are both negative for acute findings.  Covid antigen test is negative.  Patient's lactic acid is slightly elevated.  Overall presentation is most consistent with a UTI.  The patient has received fluids and I have ordered ceftriaxone.  Given her nausea and vomiting, weakness, and possible mild altered mental status I  recommended admission and the patient and her daughter agreed.  I discussed the case with the hospitalist.  ________________________  Sandy Salaam was evaluated in Emergency Department on 10/28/2019 for the symptoms described in the history of present illness. She was evaluated in the context of the global COVID-19 pandemic, which necessitated consideration that the patient might be at risk for infection with the SARS-CoV-2 virus that causes COVID-19. Institutional protocols and  algorithms that pertain to the evaluation of patients at risk for COVID-19 are in a state of rapid change based on information released by regulatory bodies including the CDC and federal and state organizations. These policies and algorithms were followed during the patient's care in the ED. ____________________________________________   FINAL CLINICAL IMPRESSION(S) / ED DIAGNOSES  Final diagnoses:  Urinary tract infection without hematuria, site unspecified      NEW MEDICATIONS STARTED DURING THIS VISIT:  New Prescriptions   No medications on file     Note:  This document was prepared using Dragon voice recognition software and may include unintentional dictation errors.    Arta Silence, MD 10/28/19 Jolayne Haines, MD 10/28/19 2008

## 2019-10-28 NOTE — ED Notes (Signed)
Pt alert  nsr on monitor. No n/v/ at this time.  Family with pt

## 2019-10-28 NOTE — ED Triage Notes (Signed)
Patient presents to the ED with lower mid abdominal pain, nausea, vomiting and diarrhea.  Patient is a poor historian and is having difficulty answering questions.  Patient states she feels more confused than normal.

## 2019-10-29 DIAGNOSIS — R197 Diarrhea, unspecified: Secondary | ICD-10-CM

## 2019-10-29 DIAGNOSIS — N1831 Chronic kidney disease, stage 3a: Secondary | ICD-10-CM

## 2019-10-29 DIAGNOSIS — I10 Essential (primary) hypertension: Secondary | ICD-10-CM

## 2019-10-29 DIAGNOSIS — N3000 Acute cystitis without hematuria: Secondary | ICD-10-CM

## 2019-10-29 LAB — CBC
HCT: 30.9 % — ABNORMAL LOW (ref 36.0–46.0)
Hemoglobin: 8.8 g/dL — ABNORMAL LOW (ref 12.0–15.0)
MCH: 19.3 pg — ABNORMAL LOW (ref 26.0–34.0)
MCHC: 28.5 g/dL — ABNORMAL LOW (ref 30.0–36.0)
MCV: 67.6 fL — ABNORMAL LOW (ref 80.0–100.0)
Platelets: 340 10*3/uL (ref 150–400)
RBC: 4.57 MIL/uL (ref 3.87–5.11)
RDW: 19.3 % — ABNORMAL HIGH (ref 11.5–15.5)
WBC: 8.5 10*3/uL (ref 4.0–10.5)
nRBC: 0 % (ref 0.0–0.2)

## 2019-10-29 LAB — BASIC METABOLIC PANEL
Anion gap: 14 (ref 5–15)
BUN: 25 mg/dL — ABNORMAL HIGH (ref 8–23)
CO2: 24 mmol/L (ref 22–32)
Calcium: 8.2 mg/dL — ABNORMAL LOW (ref 8.9–10.3)
Chloride: 104 mmol/L (ref 98–111)
Creatinine, Ser: 1.19 mg/dL — ABNORMAL HIGH (ref 0.44–1.00)
GFR calc Af Amer: 51 mL/min — ABNORMAL LOW (ref >60)
GFR calc non Af Amer: 44 mL/min — ABNORMAL LOW (ref >60)
Glucose, Bld: 73 mg/dL (ref 70–99)
Potassium: 3 mmol/L — ABNORMAL LOW (ref 3.5–5.1)
Sodium: 142 mmol/L (ref 135–145)

## 2019-10-29 LAB — CORTISOL-AM, BLOOD: Cortisol - AM: 16.6 ug/dL (ref 6.7–22.6)

## 2019-10-29 LAB — SARS CORONAVIRUS 2 (TAT 6-24 HRS): SARS Coronavirus 2: NEGATIVE

## 2019-10-29 LAB — GLUCOSE, CAPILLARY
Glucose-Capillary: 77 mg/dL (ref 70–99)
Glucose-Capillary: 111 mg/dL — ABNORMAL HIGH (ref 70–99)
Glucose-Capillary: 120 mg/dL — ABNORMAL HIGH (ref 70–99)
Glucose-Capillary: 138 mg/dL — ABNORMAL HIGH (ref 70–99)

## 2019-10-29 LAB — PROCALCITONIN: Procalcitonin: <0.1 ng/mL

## 2019-10-29 LAB — HEMOGLOBIN A1C
Hgb A1c MFr Bld: 7.6 % — ABNORMAL HIGH (ref 4.8–5.6)
Mean Plasma Glucose: 171.42 mg/dL

## 2019-10-29 LAB — MAGNESIUM: Magnesium: 2.1 mg/dL (ref 1.7–2.4)

## 2019-10-29 LAB — PROTIME-INR
INR: 1 (ref 0.8–1.2)
Prothrombin Time: 13.1 seconds (ref 11.4–15.2)

## 2019-10-29 MED ORDER — NITROGLYCERIN 0.4 MG SL SUBL: 0.4000 mg | SUBLINGUAL_TABLET | SUBLINGUAL | Status: DC | PRN

## 2019-10-29 MED ORDER — AMLODIPINE BESYLATE 5 MG PO TABS
2.5000 mg | ORAL_TABLET | Freq: Every day | ORAL | Status: DC
  Administered 2019-10-29 – 2019-10-30 (×2): 2.5 mg via ORAL
  Filled 2019-10-29 (×3): qty 1

## 2019-10-29 MED ORDER — CARVEDILOL 12.5 MG PO TABS
12.5000 mg | ORAL_TABLET | Freq: Two times a day (BID) | ORAL | Status: DC
  Administered 2019-10-29 – 2019-10-30 (×2): 12.5 mg via ORAL
  Filled 2019-10-29 (×2): qty 1

## 2019-10-29 MED ORDER — LORATADINE 10 MG PO TABS
10.0000 mg | ORAL_TABLET | Freq: Every day | ORAL | Status: DC
  Administered 2019-10-29 – 2019-10-30 (×2): 10 mg via ORAL
  Filled 2019-10-29 (×3): qty 1

## 2019-10-29 MED ORDER — CARVEDILOL 25 MG PO TABS
25.0000 mg | ORAL_TABLET | Freq: Two times a day (BID) | ORAL | Status: DC
  Administered 2019-10-29: 11:00:00 25 mg via ORAL
  Filled 2019-10-29: qty 1

## 2019-10-29 MED ORDER — POTASSIUM CHLORIDE CRYS ER 20 MEQ PO TBCR
40.0000 meq | EXTENDED_RELEASE_TABLET | Freq: Once | ORAL | Status: AC
  Administered 2019-10-29: 40 meq via ORAL
  Filled 2019-10-29: qty 2

## 2019-10-29 MED ORDER — ALUM & MAG HYDROXIDE-SIMETH 200-200-20 MG/5ML PO SUSP
30.0000 mL | Freq: Four times a day (QID) | ORAL | Status: DC | PRN
  Administered 2019-10-29: 30 mL via ORAL
  Filled 2019-10-29: qty 30

## 2019-10-29 MED ORDER — CLOPIDOGREL BISULFATE 75 MG PO TABS
75.0000 mg | ORAL_TABLET | Freq: Every day | ORAL | Status: DC
  Administered 2019-10-29 – 2019-10-30 (×2): 75 mg via ORAL
  Filled 2019-10-29 (×3): qty 1

## 2019-10-29 MED ORDER — PANTOPRAZOLE SODIUM 40 MG PO TBEC
40.0000 mg | DELAYED_RELEASE_TABLET | Freq: Every day | ORAL | Status: DC
  Administered 2019-10-29 – 2019-10-30 (×2): 40 mg via ORAL
  Filled 2019-10-29 (×3): qty 1

## 2019-10-29 MED ORDER — MELOXICAM 7.5 MG PO TABS
15.0000 mg | ORAL_TABLET | Freq: Every day | ORAL | Status: DC
  Administered 2019-10-30: 15 mg via ORAL
  Filled 2019-10-29: qty 2

## 2019-10-29 MED ORDER — PREGABALIN 50 MG PO CAPS
50.0000 mg | ORAL_CAPSULE | Freq: Two times a day (BID) | ORAL | Status: DC
  Administered 2019-10-29 – 2019-10-30 (×3): 50 mg via ORAL
  Filled 2019-10-29 (×4): qty 1

## 2019-10-29 MED ORDER — POTASSIUM CHLORIDE 10 MEQ/100ML IV SOLN
10.0000 meq | INTRAVENOUS | Status: AC
  Administered 2019-10-29 (×2): 10 meq via INTRAVENOUS
  Filled 2019-10-29: qty 100

## 2019-10-29 NOTE — TOC Initial Note (Signed)
Transition of Care [facility]) - Initial/Assessment Note    Patient Details  Name: Carly Cortez MRN: [id number] Date of Birth: 10-Feb-1941  Transition of Care [facility]) CM/SW Contact:    Candie Chroman, LCSW Phone Number: 10/29/2019, 3:28 PM  Clinical Narrative: CSW met with patient. No supports at bedside. CSW introduced role and explained that PT recommendations would be discussed. Patient agreeable to home health. No agency preference. Made her aware that her insurance is not a great payor for home health so if we cannot find anyone to take her, we may need to look at outpatient PT or have her call her PCP in 1-2 weeks to see if staffing is any better. PT recommending a rolling walker. Patient agreeable. She is currently borrowing her daughter's but would like her own. No further concerns. CSW encouraged patient to contact CSW as needed. CSW will continue to follow patient for support and facilitate return home when stable.                Expected Discharge Plan: [place] Barriers to Discharge: Continued Medical Work up   Patient Goals and CMS Choice   CMS Medicare.gov Compare Post Acute Care list provided to:: Patient    Expected Discharge Plan and Services Expected Discharge Plan: [place] Choice: Durable Medical Equipment, Home Health Living arrangements for the past 2 months: Single Family Home                                      Prior Living Arrangements/Services Living arrangements for the past 2 months: Single Family Home Lives with:: Spouse Patient language and need for interpreter reviewed:: Yes Do you feel safe going back to the place where you live?: Yes      Need for Family Participation in Patient Care: Yes (Comment) Care giver support system in place?: Yes (comment)   Criminal Activity/Legal Involvement Pertinent to Current Situation/Hospitalization: No - Comment as needed  Activities of Daily  Living Home Assistive Devices/Equipment: Gilford Rile (specify type) ADL Screening (condition at time of admission) Patient's cognitive ability adequate to safely complete daily activities?: Yes Is the patient deaf or have difficulty hearing?: No Does the patient have difficulty seeing, even when wearing glasses/contacts?: Yes Does the patient have difficulty concentrating, remembering, or making decisions?: No Patient able to express need for assistance with ADLs?: Yes Does the patient have difficulty dressing or bathing?: No Independently performs ADLs?: Yes (appropriate for developmental age) Does the patient have difficulty walking or climbing stairs?: No Weakness of Legs: None Weakness of Arms/Hands: None  Permission Sought/Granted Permission sought to share information with : Facility Art therapist granted to share information with : Yes, Verbal Permission Granted     Permission granted to share info w AGENCY: Home health agencies        Emotional Assessment Appearance:: Appears stated age Attitude/Demeanor/Rapport: Engaged, Gracious Affect (typically observed): Accepting, Appropriate, Calm, Pleasant Orientation: : Oriented to Self, Oriented to Place, Oriented to  Time, Oriented to Situation Alcohol / Substance Use: Not Applicable Psych Involvement: No (comment)  Admission diagnosis:  Sepsis ([place]) [A41.9] Urinary tract infection without hematuria, site unspecified [N39.0] Patient Active Problem List   Diagnosis Date Noted  . Diarrhea   . Chronic renal failure, stage 3a   . UTI (urinary tract infection) 10/28/2019  . Acute gastroenteritis 10/28/2019  .  Controlled type 2 diabetes with neuropathy ([place]) 10/28/2019  . History of partial colectomy 10/28/2019  . History of colon cancer 10/28/2019  . History of cancer of vulva 10/28/2019  . Essential hypertension 10/28/2019  . Hyperlipidemia 10/28/2019  . Sepsis ([place]) 10/28/2019  . Acute metabolic  encephalopathy 88/[id number]/7308  . CAD (coronary artery disease) 10/28/2019  . Hyperglycemia due to type 2 diabetes mellitus ([place]) 10/28/2019  . Syncope 12/17/2016   PCP:  Verita Lamb, NP Pharmacy:   [facility] [address], [place] [address] [place] [address] Phone: [phone] Fax: [phone]     Social Determinants of Health (SDOH) Interventions    Readmission Risk Interventions No flowsheet data found.

## 2019-10-29 NOTE — Evaluation (Signed)
Physical Therapy Evaluation Patient Details Name: Carly Cortez MRN: [id number] DOB: 17-Nov-1940 Today's Date: 10/29/2019   History of Present Illness  Pt is a 79 y.o. female presenting to hospital 10/28/19 with N/V x2 days, diarrhea, lower mid abdominal pain, and feeling confused.  Pt admitted with severe sepsis, acute metabolic encephalopathy, UTI, acute gastroenteritis, and hyperglycemia.  PMH includes h/o nondisplaced distal radius fx R 08/23/19, hiatal hernia, vaginal CA, CKD, DM, gout, htn, h/o colon CA, partial colectomy, neuropathy, CAD.  Clinical Impression  Prior to hospital admission, pt was ambulatory; h/o falls; lives with her husband in 1 level home with 4 STE with B railings.  Currently pt is CGA with transfers recliner to/from [facility]; pt declined ambulation d/t abdominal pain/discomfort (nurse aware and gave pt medication beginning of session).  Pt would benefit from skilled PT to address noted impairments and functional limitations (see below for any additional details).  Upon hospital discharge, pt would benefit from [place].    Follow Up Recommendations Home health PT;Supervision/Assistance - 24 hour    Equipment Recommendations  Rolling walker with 5" wheels    Recommendations for Other Services OT consult     Precautions / Restrictions Precautions Precautions: Fall Precaution Comments: h/o R nondisplaced distal radius fx (pt reports MD told her to use it per her comfort level--no need for brace anymore) Restrictions Weight Bearing Restrictions: No      Mobility  Bed Mobility               General bed mobility comments: Deferred (pt up in chair beginning/end of session)  Transfers Overall transfer level: Needs assistance Equipment used: None Transfers: Sit to/from [facility] Sit to Stand: Min guard Stand pivot transfers: Min guard       General transfer comment: mild increased effort to stand up from recliner and BSC; stand step turn  recliner to/from [facility] with UE support as needed/per pt  Ambulation/Gait             General Gait Details: Deferred ambulation--pt declined d/t abdominal discomfort  Stairs            Wheelchair Mobility    Modified Rankin (Stroke Patients Only)       Balance Overall balance assessment: Needs assistance Sitting-balance support: No upper extremity supported;Feet supported Sitting balance-Leahy Scale: Normal Sitting balance - Comments: steady sitting reaching within BOS   Standing balance support: No upper extremity supported Standing balance-Leahy Scale: Fair Standing balance comment: pt steady static standing no UE support                             Pertinent Vitals/Pain Pain Assessment: Faces Pain Descriptors / Indicators: Discomfort;Guarding;Grimacing;Aching;Cramping Pain Intervention(s): Limited activity within patient's tolerance;Monitored during session;Repositioned(RN gave pt medication for stomach discomfort beginning of session)    Home Living Family/patient expects to be discharged to:: Private residence Living Arrangements: Spouse/significant other Available Help at Discharge: Family Type of Home: House Home Access: Stairs to enter Entrance Stairs-Rails: Right;Left;Can reach both Technical brewer of Steps: 4 Home Layout: One level Home Equipment: Walker - 2 wheels      Prior Function Level of Independence: Independent         Comments: 1 fall in November 2020     Hand Dominance        Extremity/Trunk Assessment   Upper Extremity Assessment Upper Extremity Assessment: Generalized weakness    Lower Extremity Assessment Lower Extremity Assessment: Generalized weakness  Cervical / Trunk Assessment Cervical / Trunk Assessment: Normal  Communication   Communication: No difficulties  Cognition Arousal/Alertness: Awake/alert Behavior During Therapy: WFL for tasks assessed/performed Overall Cognitive Status: Within  Functional Limits for tasks assessed                                        General Comments   Nursing cleared pt for participation in physical therapy.  Pt agreeable to PT session.    Exercises     Assessment/Plan    PT Assessment Patient needs continued PT services  PT Problem List Decreased strength;Decreased activity tolerance;Decreased balance;Decreased mobility;Pain       PT Treatment Interventions DME instruction;Gait training;Stair training;Functional mobility training;Therapeutic activities;Therapeutic exercise;Balance training;Patient/family education    PT Goals (Current goals can be found in the Care Plan section)  Acute Rehab PT Goals Patient Stated Goal: to improve pain PT Goal Formulation: With patient Time For Goal Achievement: 11/12/19 Potential to Achieve Goals: Fair    Frequency Min 2X/week   Barriers to discharge        Co-evaluation               AM-PAC PT "6 Clicks" Mobility  Outcome Measure Help needed turning from your back to your side while in a flat bed without using bedrails?: A Little Help needed moving from lying on your back to sitting on the side of a flat bed without using bedrails?: A Little Help needed moving to and from a bed to a chair (including a wheelchair)?: A Little Help needed standing up from a chair using your arms (e.g., wheelchair or bedside chair)?: A Little Help needed to walk in hospital room?: A Little Help needed climbing 3-5 steps with a railing? : A Lot 6 Click Score: 17    End of Session Equipment Utilized During Treatment: Gait belt(up high per pt preference) Activity Tolerance: Patient limited by pain Patient left: in chair;with call bell/phone within reach;with chair alarm set Nurse Communication: Mobility status;Precautions(one of pt's abdominal dressings fell off when pt removing underwear: nurse notified; NT notified of pt's request to replace purewick and for ice chips) PT Visit  Diagnosis: Other abnormalities of gait and mobility (R26.89);Muscle weakness (generalized) (M62.81);Difficulty in walking, not elsewhere classified (R26.2)    Time: [id number] PT Time Calculation (min) (ACUTE ONLY): 26 min   Charges:   PT Evaluation $PT Eval Low Complexity: 1 Low PT Treatments $Therapeutic Activity: 8-22 mins       Leitha Bleak, PT 10/29/19, 1:01 PM

## 2019-10-29 NOTE — Progress Notes (Signed)
Patient ID: Carly Cortez, female   DOB: 02/02/1941, 79 y.o.   MRN: [id number] [facility] PROGRESS NOTE  Carly Cortez [id number] DOB: 10-03-1940 DOA: 10/28/2019 PCP: Verita Lamb, NP  HPI/Subjective: Patient admitted yesterday with altered mental status.  Case discussed with daughter about her mental status today and she says she is better today than she was yesterday.  She started getting better last night once they got the IV working got some fluids and antibiotic and her.  Patient does complain of some abdominal discomfort lower down.  Did have some diarrhea but no further diarrhea while here.  Does have burning on urination and dark urine.  History of radiation.   Objective: Vitals:   10/29/19 0559 10/29/19 1202  BP: (!) 145/74 (!) 130/59  Pulse: 70 67  Resp: 20 18  Temp: 99.1 F (37.3 C) 98.9 F (37.2 C)  SpO2: 98% 95%    Intake/Output Summary (Last 24 hours) at 10/29/2019 1317 Last data filed at 10/29/2019 1036 Gross per 24 hour  Intake --  Output 100 ml  Net -100 ml   Filed Weights   10/28/19 1534 10/28/19 2158  Weight: 81.6 kg 79.2 kg    ROS: Review of Systems  Constitutional: Negative for chills and fever.  Eyes: Negative for blurred vision.  Respiratory: Negative for cough and shortness of breath.   Cardiovascular: Negative for chest pain.  Gastrointestinal: Positive for abdominal pain. Negative for constipation, diarrhea, nausea and vomiting.  Genitourinary: Negative for dysuria.  Musculoskeletal: Negative for joint pain.  Neurological: Negative for dizziness and headaches.   Exam: Physical Exam  HENT:  Nose: No mucosal edema.  Mouth/Throat: No oropharyngeal exudate or posterior oropharyngeal edema.  Eyes: Conjunctivae and lids are normal.  Neck: Carotid bruit is not present.  Cardiovascular: S1 normal and S2 normal. Exam reveals no gallop.  No murmur heard. Respiratory: No respiratory distress. She has no wheezes. She has no rhonchi. She has  no rales.  GI: Soft. Bowel sounds are normal. There is abdominal tenderness in the suprapubic area.  Musculoskeletal:     Right ankle: No swelling.     Left ankle: No swelling.  Lymphadenopathy:    She has no cervical adenopathy.  Neurological: She is alert.  Patient is a wheeze slow with her speech as per family  Skin: Skin is warm. No rash noted. Nails show no clubbing.  Psychiatric: She has a normal mood and affect.      Data Reviewed: Basic Metabolic Panel: Recent Labs  Lab 10/28/19 1537 10/29/19 0440  NA 136 142  K 3.3* 3.0*  CL 103 104  CO2 22 24  GLUCOSE 236* 73  BUN 18 25*  CREATININE 0.97 1.19*  CALCIUM 8.9 8.2*  MG  --  2.1   Liver Function Tests: Recent Labs  Lab 10/28/19 1537  AST 16  ALT 7  ALKPHOS 89  BILITOT 0.8  PROT 7.8  ALBUMIN 3.6   Recent Labs  Lab 10/28/19 1537  LIPASE 24   CBC: Recent Labs  Lab 10/28/19 1537 10/29/19 0440  WBC 13.6* 8.5  HGB 10.6* 8.8*  HCT 36.1 30.9*  MCV 65.5* 67.6*  PLT 474* 340    CBG: Recent Labs  Lab 10/28/19 2215 10/29/19 0754 10/29/19 1200  GLUCAP 142* 77 111*    Recent Results (from the past 240 hour(s))  SARS CORONAVIRUS 2 (TAT 6-24 HRS) Nasopharyngeal Nasopharyngeal Swab     Status: None   Collection Time: 10/28/19  7:45 PM  Specimen: Nasopharyngeal Swab  Result Value Ref Range Status   SARS Coronavirus 2 NEGATIVE NEGATIVE Final    Comment: (NOTE) SARS-CoV-2 target nucleic acids are NOT DETECTED. The SARS-CoV-2 RNA is generally detectable in upper and lower respiratory specimens during the acute phase of infection. Negative results do not preclude SARS-CoV-2 infection, do not rule out co-infections with other pathogens, and should not be used as the sole basis for treatment or other patient management decisions. Negative results must be combined with clinical observations, patient history, and epidemiological information. The expected result is Negative. Fact Sheet for  Patients: SugarRoll.be Fact Sheet for Healthcare Providers: https://www.woods-mathews.com/ This test is not yet approved or cleared by the [place] FDA and  has been authorized for detection and/or diagnosis of SARS-CoV-2 by FDA under an Emergency Use Authorization (EUA). This EUA will remain  in effect (meaning this test can be used) for the duration of the COVID-19 declaration under Section 56 4(b)(1) of the Act, 21 U.S.C. section 360bbb-3(b)(1), unless the authorization is terminated or revoked sooner. Performed at [facility], [place] [address] [place], [place] [address]      Studies: CT ABDOMEN PELVIS WO CONTRAST  Result Date: 10/28/2019 CLINICAL DATA:  Nausea and vomiting for 2 days. EXAM: CT ABDOMEN AND PELVIS WITHOUT CONTRAST TECHNIQUE: Multidetector CT imaging of the abdomen and pelvis was performed following the standard protocol without IV contrast. COMPARISON:  12/10/2018 FINDINGS: Lower chest: No acute abnormality.  Large hiatal hernia. Hepatobiliary: Status post cholecystectomy. No biliary dilatation. No suspicious liver lesion. Pancreas: Fatty replacement of the pancreas. Spleen: Normal appearance of the spleen. Adrenals/Urinary Tract: No normal appearance of the adrenal glands. No kidney stones identified bilaterally. The urinary bladder is unremarkable. Stomach/Bowel: Large hiatal hernia. The small bowel loops have a normal course and caliber. Postoperative changes from right hemicolectomy identified. No bowel wall thickening, inflammation or distension identified. Sigmoid diverticulosis without acute inflammation. Vascular/Lymphatic: Aortic atherosclerosis. No aneurysm. No abdominopelvic adenopathy identified. Reproductive: Status post hysterectomy. No adnexal masses. Other: No free fluid or fluid collections identified. Musculoskeletal: Degenerative disc disease noted within the lumbar spine. IMPRESSION: 1. No acute findings  within the abdomen or pelvis. 2. Hiatal hernia. 3.  Aortic Atherosclerosis (ICD10-I70.0). Electronically Signed   By: Kerby Moors M.D.   On: 10/28/2019 18:57   DG Chest Portable 1 View  Result Date: 10/28/2019 CLINICAL DATA:  Fever and shortness of breath. EXAM: PORTABLE CHEST 1 VIEW COMPARISON:  07/18/2016 FINDINGS: The heart size is normal. Aortic atherosclerosis. There is no pericardial effusion identified. Large hiatal hernia. No superimposed airspace consolidation. IMPRESSION: 1. Lungs are clear. 2. Hiatal hernia 3.  Aortic Atherosclerosis (ICD10-I70.0). Electronically Signed   By: Kerby Moors M.D.   On: 10/28/2019 18:41    Scheduled Meds: . amLODipine  2.5 mg Oral Daily  . carvedilol  25 mg Oral BID  . clopidogrel  75 mg Oral Daily  . enoxaparin (LOVENOX) injection  40 mg Subcutaneous Q24H  . insulin aspart  0-15 Units Subcutaneous TID WC  . insulin aspart  0-5 Units Subcutaneous QHS  . loratadine  10 mg Oral Daily  . [START ON 10/30/2019] meloxicam  15 mg Oral Daily  . pantoprazole  40 mg Oral Daily  . pregabalin  50 mg Oral BID   Continuous Infusions: . sodium chloride 100 mL/hr at 10/29/19 0837  . cefTRIAXone (ROCEPHIN)  IV    . sodium chloride      Assessment/Plan:  1. Acute metabolic encephalopathy.  As per  daughter this has improved with antibiotic and IV fluids.  Likely dehydration the cause.  Restart her oral medications. 2. Acute cystitis without hematuria.  Bladder scan did not show any urinary retention.  On empiric Rocephin.  I ordered urine culture.  I do not think the patient had severe sepsis. 3. Diarrhea and dark urine.  Could be radiation changes.  Follow-up urine culture and stool studies if able to give a sample. 4. Chronic kidney disease stage IIIa.  Continue IV fluids and recheck creatinine in the morning 5. Essential hypertension.  Restart Norvasc and Coreg at lower dose 6. History of CAD on Plavix  Code Status:     Code Status Orders  (From  admission, onward)         Start     Ordered   10/28/19 2025  Full code  Continuous     10/28/19 2027        Code Status History    Date Active Date Inactive Code Status Order ID Comments User Context   12/17/2016 1533 12/18/2016 1837 Full Code [id number]  Bettey Costa, MD Inpatient   Advance Care Planning Activity     Family Communication: Spoke with daughter on the phone Disposition Plan: Reassess on a day-to-day basis on when to go home with home health.  Antibiotics:  Rocephin  Time spent: 28 minutes  [place]

## 2019-10-30 DIAGNOSIS — E1140 Type 2 diabetes mellitus with diabetic neuropathy, unspecified: Secondary | ICD-10-CM

## 2019-10-30 DIAGNOSIS — N390 Urinary tract infection, site not specified: Secondary | ICD-10-CM

## 2019-10-30 LAB — C DIFFICILE QUICK SCREEN W PCR REFLEX
C Diff antigen: NEGATIVE
C Diff interpretation: NOT DETECTED
C Diff toxin: NEGATIVE

## 2019-10-30 LAB — BASIC METABOLIC PANEL
Anion gap: 5 (ref 5–15)
BUN: 22 mg/dL (ref 8–23)
CO2: 24 mmol/L (ref 22–32)
Calcium: 8.1 mg/dL — ABNORMAL LOW (ref 8.9–10.3)
Chloride: 111 mmol/L (ref 98–111)
Creatinine, Ser: 1.06 mg/dL — ABNORMAL HIGH (ref 0.44–1.00)
GFR calc Af Amer: 59 mL/min — ABNORMAL LOW (ref >60)
GFR calc non Af Amer: 51 mL/min — ABNORMAL LOW (ref >60)
Glucose, Bld: 121 mg/dL — ABNORMAL HIGH (ref 70–99)
Potassium: 3.6 mmol/L (ref 3.5–5.1)
Sodium: 140 mmol/L (ref 135–145)

## 2019-10-30 LAB — URINE CULTURE: Culture: NO GROWTH

## 2019-10-30 LAB — PROCALCITONIN: Procalcitonin: <0.1 ng/mL

## 2019-10-30 LAB — MAGNESIUM: Magnesium: 2 mg/dL (ref 1.7–2.4)

## 2019-10-30 LAB — GLUCOSE, CAPILLARY
Glucose-Capillary: 102 mg/dL — ABNORMAL HIGH (ref 70–99)
Glucose-Capillary: 180 mg/dL — ABNORMAL HIGH (ref 70–99)

## 2019-10-30 MED ORDER — CEFDINIR 300 MG PO CAPS: 300.0000 mg | ORAL_CAPSULE | Freq: Two times a day (BID) | ORAL | 0 refills | Status: AC

## 2019-10-30 NOTE — TOC Transition Note (Signed)
Transition of Care [facility]) - CM/SW Discharge Note   Patient Details  Name: Carly Cortez MRN: [id number] Date of Birth: 1941-05-07  Transition [facility]) CM/SW Contact:  Candie Chroman, LCSW Phone Number: 10/30/2019, 1:09 PM   Clinical Narrative: Patient has been accepted by [facility] for HHPT. Start of care will be Friday 2/26. Patient is aware and agreeable. Ordered rolling walker through [employer]. Her husband will pick her up today. No further concerns. CSW signing off.    Final next level of care: Home w Home Health Services Barriers to Discharge: Barriers Resolved   Patient Goals and CMS Choice   CMS Medicare.gov Compare Post Acute Care list provided to:: Patient Choice offered to / list presented to : Patient  Discharge Placement                Patient to be transferred to facility by: Husband will pick her up   Patient and family notified of of transfer: 10/30/19  Discharge Plan and Services     Post Acute Care Choice: Durable Medical Equipment, Home Health          DME Arranged: Walker rolling DME Agency: [employer] Date DME Agency Contacted: 10/30/19   Representative spoke with at DME Agency: [place]: PT [place]: [place] ([place]) Date [place]: 10/30/19   Representative spoke with at [place]: Floydene Flock  Social Determinants of Health ([facility]) Interventions     Readmission Risk Interventions No flowsheet data found.

## 2019-10-30 NOTE — Discharge Summary (Signed)
Physician Discharge Summary  LARAY RUELL [id number] DOB: 1941/04/18 DOA: 10/28/2019  PCP: Verita Lamb, NP  Admit date: 10/28/2019 Discharge date: 10/30/2019  Admitted From: Home Disposition: Home  Recommendations for Outpatient Follow-up:  1. Follow up with PCP in 1-2 weeks 2. Please obtain BMP/CBC in one week 3. Please follow up on the following pending results: None  Home Health: Yes Equipment/Devices: Rolling walker Discharge Condition: Stable CODE STATUS: Full Diet recommendation: Heart Healthy / Carb Modified   Brief/Interim Summary: Carly Cortez is a 79 y.o. female with medical history significant for type 2 diabetes with neuropathy, hypertension, coronary artery disease with history of stent angioplasty, remote history of colon cancer status post partial colectomy, as well as history of recurrent cancer of the vulva, completing radiation treatment in 2020, who at baseline is functionally independent, brought to the emergency room with a 3-day history of malaise,  nonbloody nonbilious vomiting up to five times daily, lower abdominal pain and frequent urination, dark in color associated with soft diarrhea, 1-2 daily, and low-grade temperature of 100.  On the day of arrival her daughter also noted increasing confusion. She was suspected to have UTI with UA, being febrile and leukocytosis, she was started empirically on ceftriaxone and responded well.  Urine culture without any growth but they were collected after the start of antibiotics.  Her lower abdominal pain improved. She was discharged home on cefdinir for 3 more days to complete the course due to symptomatic suspected UTI.  She will continue rest of her home meds and follow-up with her primary care.  She was also evaluated by our physical therapist and they were recommending PT with home health services which were ordered.  Discharge Diagnoses:  Active Problems:   UTI (urinary tract infection)   Acute  gastroenteritis   Controlled type 2 diabetes with neuropathy ([facility])   History of partial colectomy   History of colon cancer   History of cancer of vulva   Essential hypertension   Hyperlipidemia   Sepsis ([place])   Acute metabolic encephalopathy   CAD (coronary artery disease)   Hyperglycemia due to type 2 diabetes mellitus (H[facility])   Diarrhea   Chronic renal failure, stage 3a  Discharge Instructions  Discharge Instructions    Diet - low sodium heart healthy   Complete by: As directed    Discharge instructions   Complete by: As directed    It was pleasure taking care of you. I am giving you antibiotics for 3 more days for your urinary tract infection. Please follow-up with your primary care physician within 1 to 2 weeks.   Increase activity slowly   Complete by: As directed      Allergies as of 10/30/2019      Reactions   Contrast Media [iodinated Diagnostic Agents] Swelling   Colchicine Other (See Comments)   Gallbladder Problems   Etodolac Swelling   Indomethacin Swelling   Oxaprozin Swelling      Medication List    TAKE these medications   amLODipine 2.5 MG tablet Commonly known as: NORVASC Take 2.5 mg by mouth daily.   aspirin 81 MG tablet Take 81 mg by mouth every other day.   carvedilol 25 MG tablet Commonly known as: COREG Take 25 mg by mouth 2 (two) times daily.   cefdinir 300 MG capsule Commonly known as: OMNICEF Take 1 capsule (300 mg total) by mouth 2 (two) times daily for 3 days.   clopidogrel 75 MG tablet Commonly known as: PLAVIX Take  75 mg by mouth daily.   fexofenadine 180 MG tablet Commonly known as: ALLEGRA Take 180 mg by mouth daily.   furosemide 20 MG tablet Commonly known as: LASIX Take 20 mg by mouth See admin instructions. Take 1 tablet (20mg ) by mouth daily and take 1 additional tablet (20mg ) by mouth daily as needed for fluid   insulin glargine 100 UNIT/ML injection Commonly known as: LANTUS Inject 44 Units into the skin 2 (two)  times daily.   meloxicam 15 MG tablet Commonly known as: MOBIC Take 15 mg by mouth daily.   nitroGLYCERIN 0.4 MG SL tablet Commonly known as: NITROSTAT Place 0.4 mg under the tongue every 5 (five) minutes as needed for chest pain.   pantoprazole 40 MG tablet Commonly known as: PROTONIX Take 40 mg by mouth daily.   pregabalin 50 MG capsule Commonly known as: LYRICA Take 50 mg by mouth 2 (two) times daily.   Repatha SureClick [id number] MG/ML So[facility] Generic drug: Evolocumab Inject 140 mg into the skin every 14 (fourteen) days.            Durable Medical Equipment  (From admission, onward)         Start     Ordered   10/30/19 1022  For home use only DME Walker rolling  Once    Question Answer Comment  Walker: With 5 Inch Wheels   Patient needs a walker to treat with the following condition Generalized weakness      10/30/19 1024          Allergies  Allergen Reactions  . Contrast Media [Iodinated Diagnostic Agents] Swelling  . Colchicine Other (See Comments)    Gallbladder Problems  . Etodolac Swelling  . Indomethacin Swelling  . Oxaprozin Swelling    Consultations:  None  Procedures/Studies: CT ABDOMEN PELVIS WO CONTRAST  Result Date: 10/28/2019 CLINICAL DATA:  Nausea and vomiting for 2 days. EXAM: CT ABDOMEN AND PELVIS WITHOUT CONTRAST TECHNIQUE: Multidetector CT imaging of the abdomen and pelvis was performed following the standard protocol without IV contrast. COMPARISON:  12/10/2018 FINDINGS: Lower chest: No acute abnormality.  Large hiatal hernia. Hepatobiliary: Status post cholecystectomy. No biliary dilatation. No suspicious liver lesion. Pancreas: Fatty replacement of the pancreas. Spleen: Normal appearance of the spleen. Adrenals/Urinary Tract: No normal appearance of the adrenal glands. No kidney stones identified bilaterally. The urinary bladder is unremarkable. Stomach/Bowel: Large hiatal hernia. The small bowel loops have a normal course and caliber.  Postoperative changes from right hemicolectomy identified. No bowel wall thickening, inflammation or distension identified. Sigmoid diverticulosis without acute inflammation. Vascular/Lymphatic: Aortic atherosclerosis. No aneurysm. No abdominopelvic adenopathy identified. Reproductive: Status post hysterectomy. No adnexal masses. Other: No free fluid or fluid collections identified. Musculoskeletal: Degenerative disc disease noted within the lumbar spine. IMPRESSION: 1. No acute findings within the abdomen or pelvis. 2. Hiatal hernia. 3.  Aortic Atherosclerosis (ICD10-I70.0). Electronically Signed   By: Kerby Moors M.D.   On: 10/28/2019 18:57   DG Chest Portable 1 View  Result Date: 10/28/2019 CLINICAL DATA:  Fever and shortness of breath. EXAM: PORTABLE CHEST 1 VIEW COMPARISON:  07/18/2016 FINDINGS: The heart size is normal. Aortic atherosclerosis. There is no pericardial effusion identified. Large hiatal hernia. No superimposed airspace consolidation. IMPRESSION: 1. Lungs are clear. 2. Hiatal hernia 3.  Aortic Atherosclerosis (ICD10-I70.0). Electronically Signed   By: Kerby Moors M.D.   On: 10/28/2019 18:41    Subjective: Patient was resting comfortably when seen this morning before discharge.  She was complaining of  mild left shoulder discomfort, stating that she slept on that side and asking for a Tylenol which were provided.  Her lower abdominal pain is improving.  No nausea, vomiting or diarrhea.  Discharge Exam: Vitals:   10/29/19 2126 10/30/19 0608  BP: 137/67 (!) 126/56  Pulse: 72 64  Resp: 20 20  Temp: 99.4 F (37.4 C) 98.4 F (36.9 C)  SpO2: 90% 93%   Vitals:   10/29/19 0559 10/29/19 1202 10/29/19 2126 10/30/19 0608  BP: (!) 145/74 (!) 130/59 137/67 (!) 126/56  Pulse: 70 67 72 64  Resp: 20 18 20 20   Temp: 99.1 F (37.3 C) 98.9 F (37.2 C) 99.4 F (37.4 C) 98.4 F (36.9 C)  TempSrc: Oral Oral Oral   SpO2: 98% 95% 90% 93%  Weight:      Height:        General: Pt is  alert, awake, not in acute distress Cardiovascular: RRR, S1/S2 +, no rubs, no gallops Respiratory: CTA bilaterally, no wheezing, no rhonchi Abdominal: Soft, NT, ND, bowel sounds + Extremities: no edema, no cyanosis   The results of significant diagnostics from this hospitalization (including imaging, microbiology, ancillary and laboratory) are listed below for reference.    Microbiology: Recent Results (from the past 240 hour(s))  SARS CORONAVIRUS 2 (TAT 6-24 HRS) Nasopharyngeal Nasopharyngeal Swab     Status: None   Collection Time: 10/28/19  7:45 PM   Specimen: Nasopharyngeal Swab  Result Value Ref Range Status   SARS Coronavirus 2 NEGATIVE NEGATIVE Final    Comment: (NOTE) SARS-CoV-2 target nucleic acids are NOT DETECTED. The SARS-CoV-2 RNA is generally detectable in upper and lower respiratory specimens during the acute phase of infection. Negative results do not preclude SARS-CoV-2 infection, do not rule out co-infections with other pathogens, and should not be used as the sole basis for treatment or other patient management decisions. Negative results must be combined with clinical observations, patient history, and epidemiological information. The expected result is Negative. Fact Sheet for Patients: SugarRoll.be Fact Sheet for Healthcare Providers: https://www.woods-mathews.com/ This test is not yet approved or cleared by the [place] FDA and  has been authorized for detection and/or diagnosis of SARS-CoV-2 by FDA under an Emergency Use Authorization (EUA). This EUA will remain  in effect (meaning this test can be used) for the duration of the COVID-19 declaration under Section 56 4(b)(1) of the Act, 21 U.S.C. section 360bbb-3(b)(1), unless the authorization is terminated or revoked sooner. Performed at [facility], [place] [address] [place], [place] [address]   Urine Culture     Status: None   Collection Time:  10/29/19 10:37 AM   Specimen: Urine, Random  Result Value Ref Range Status   Specimen Description   Final    URINE, RANDOM Performed at [facility], [address], [place], [place] [address]    Special Requests   Final    NONE Performed at [facility], [address], [place], [place] [address]    Culture   Final    NO GROWTH Performed at [facility], [place] [address], [place], [place] [address]    Report Status 10/30/2019 FINAL  Final     Labs: BNP (last 3 results) No results for input(s): BNP in the last 8760 hours. Basic Metabolic Panel: Recent Labs  Lab 10/28/19 1537 10/29/19 0440 10/30/19 0312  NA 136 142 140  K 3.3* 3.0* 3.6  CL 103 104 111  CO2 22 24 24   GLUCOSE 236*  73 121*  BUN 18 25* 22  CREATININE 0.97 1.19* 1.06*  CALCIUM 8.9 8.2* 8.1*  MG  --  2.1 2.0   Liver Function Tests: Recent Labs  Lab 10/28/19 1537  AST 16  ALT 7  ALKPHOS 89  BILITOT 0.8  PROT 7.8  ALBUMIN 3.6   Recent Labs  Lab 10/28/19 1537  LIPASE 24   No results for input(s): AMMONIA in the last 168 hours. CBC: Recent Labs  Lab 10/28/19 1537 10/29/19 0440  WBC 13.6* 8.5  HGB 10.6* 8.8*  HCT 36.1 30.9*  MCV 65.5* 67.6*  PLT 474* 340   Cardiac Enzymes: No results for input(s): CKTOTAL, CKMB, CKMBINDEX, TROPONINI in the last 168 hours. BNP: Invalid input(s): POCBNP CBG: Recent Labs  Lab 10/29/19 0754 10/29/19 1200 10/29/19 1631 10/29/19 2123 10/30/19 0800  GLUCAP 77 111* 120* 138* 102*   D-Dimer No results for input(s): DDIMER in the last 72 hours. Hgb A1c Recent Labs    10/28/19 1537  HGBA1C 7.6*   Lipid Profile No results for input(s): CHOL, HDL, LDLCALC, TRIG, CHOLHDL, LDLDIRECT in the last 72 hours. Thyroid function studies No results for input(s): TSH, T4TOTAL, T3FREE, THYROIDAB in the last 72 hours.  Invalid input(s): FREET3 Anemia work up No results for input(s): VITAMINB12, FOLATE, FERRITIN, TIBC, IRON, RETICCTPCT in  the last 72 hours. Urinalysis    Component Value Date/Time   COLORURINE YELLOW (A) 10/28/2019 1538   APPEARANCEUR TURBID (A) 10/28/2019 1538   LABSPEC 1.029 10/28/2019 1538   PHURINE 5.0 10/28/2019 1538   GLUCOSEU 50 (A) 10/28/2019 1538   HGBUR NEGATIVE 10/28/2019 1538   BILIRUBINUR NEGATIVE 10/28/2019 1538   KETONESUR NEGATIVE 10/28/2019 1538   PROTEINUR 100 (A) 10/28/2019 1538   NITRITE NEGATIVE 10/28/2019 1538   LEUKOCYTESUR TRACE (A) 10/28/2019 1538   Sepsis Labs Invalid input(s): PROCALCITONIN,  WBC,  LACTICIDVEN Microbiology Recent Results (from the past 240 hour(s))  SARS CORONAVIRUS 2 (TAT 6-24 HRS) Nasopharyngeal Nasopharyngeal Swab     Status: None   Collection Time: 10/28/19  7:45 PM   Specimen: Nasopharyngeal Swab  Result Value Ref Range Status   SARS Coronavirus 2 NEGATIVE NEGATIVE Final    Comment: (NOTE) SARS-CoV-2 target nucleic acids are NOT DETECTED. The SARS-CoV-2 RNA is generally detectable in upper and lower respiratory specimens during the acute phase of infection. Negative results do not preclude SARS-CoV-2 infection, do not rule out co-infections with other pathogens, and should not be used as the sole basis for treatment or other patient management decisions. Negative results must be combined with clinical observations, patient history, and epidemiological information. The expected result is Negative. Fact Sheet for Patients: SugarRoll.be Fact Sheet for Healthcare Providers: https://www.woods-mathews.com/ This test is not yet approved or cleared by the [place] FDA and  has been authorized for detection and/or diagnosis of SARS-CoV-2 by FDA under an Emergency Use Authorization (EUA). This EUA will remain  in effect (meaning this test can be used) for the duration of the COVID-19 declaration under Section 56 4(b)(1) of the Act, 21 U.S.C. section 360bbb-3(b)(1), unless the authorization is terminated  or revoked sooner. Performed at [facility], [place] [address] [place], [place] [address]   Urine Culture     Status: None   Collection Time: 10/29/19 10:37 AM   Specimen: Urine, Random  Result Value Ref Range Status   Specimen Description   Final    URINE, RANDOM Performed at [facility], [address] [place], [place] [address]  Special Requests   Final    NONE Performed at [facility], [address] [place], [place] [address]    Culture   Final    NO GROWTH Performed at [facility], [place] [address] [place], [place] [address]    Report Status 10/30/2019 FINAL  Final    Time coordinating discharge: Over 30 minutes  SIGNED:  Lorella Nimrod, MD  [facility] 10/30/2019, 10:43 AM  If 7PM-7AM, please contact night-coverage [email or web address]  This record has been created using Systems analyst. Errors have been sought and corrected,but may not always be located. Such creation errors do not reflect on the standard of care.

## 2019-11-03 LAB — GI PATHOGEN PANEL BY PCR, STOOL

## 2019-11-04 ENCOUNTER — Emergency Department: Payer: Medicare Other

## 2019-11-04 ENCOUNTER — Emergency Department
Admission: EM | Admit: 2019-11-04 | Discharge: 2019-11-05 | Disposition: A | Discharge status: Home / Self Care | Payer: Medicare Other | Attending: Emergency Medicine | Admitting: Emergency Medicine

## 2019-11-04 ENCOUNTER — Other Ambulatory Visit: Payer: Self-pay

## 2019-11-04 ENCOUNTER — Encounter: Payer: Self-pay | Admitting: *Deleted

## 2019-11-04 DIAGNOSIS — R109 Unspecified abdominal pain: Secondary | ICD-10-CM | POA: Diagnosis present

## 2019-11-04 DIAGNOSIS — E1140 Type 2 diabetes mellitus with diabetic neuropathy, unspecified: Secondary | ICD-10-CM | POA: Insufficient documentation

## 2019-11-04 DIAGNOSIS — Z8544 Personal history of malignant neoplasm of other female genital organs: Secondary | ICD-10-CM | POA: Insufficient documentation

## 2019-11-04 DIAGNOSIS — R300 Dysuria: Secondary | ICD-10-CM | POA: Insufficient documentation

## 2019-11-04 DIAGNOSIS — I129 Hypertensive chronic kidney disease with stage 1 through stage 4 chronic kidney disease, or unspecified chronic kidney disease: Secondary | ICD-10-CM | POA: Diagnosis not present

## 2019-11-04 DIAGNOSIS — Z794 Long term (current) use of insulin: Secondary | ICD-10-CM | POA: Insufficient documentation

## 2019-11-04 DIAGNOSIS — Z79899 Other long term (current) drug therapy: Secondary | ICD-10-CM | POA: Diagnosis not present

## 2019-11-04 DIAGNOSIS — E1122 Type 2 diabetes mellitus with diabetic chronic kidney disease: Secondary | ICD-10-CM | POA: Diagnosis not present

## 2019-11-04 DIAGNOSIS — Z955 Presence of coronary angioplasty implant and graft: Secondary | ICD-10-CM | POA: Diagnosis not present

## 2019-11-04 DIAGNOSIS — R509 Fever, unspecified: Secondary | ICD-10-CM | POA: Diagnosis not present

## 2019-11-04 DIAGNOSIS — R112 Nausea with vomiting, unspecified: Secondary | ICD-10-CM | POA: Diagnosis not present

## 2019-11-04 DIAGNOSIS — I2510 Atherosclerotic heart disease of native coronary artery without angina pectoris: Secondary | ICD-10-CM | POA: Insufficient documentation

## 2019-11-04 DIAGNOSIS — N1831 Chronic kidney disease, stage 3a: Secondary | ICD-10-CM | POA: Diagnosis not present

## 2019-11-04 DIAGNOSIS — M7918 Myalgia, other site: Secondary | ICD-10-CM | POA: Insufficient documentation

## 2019-11-04 DIAGNOSIS — Z20822 Contact with and (suspected) exposure to covid-19: Secondary | ICD-10-CM | POA: Insufficient documentation

## 2019-11-04 LAB — URINALYSIS, COMPLETE (UACMP) WITH MICROSCOPIC
Bacteria, UA: NONE SEEN
Bilirubin Urine: NEGATIVE
Glucose, UA: NEGATIVE mg/dL
Hgb urine dipstick: NEGATIVE
Ketones, ur: NEGATIVE mg/dL
Leukocytes,Ua: NEGATIVE
Nitrite: NEGATIVE
Protein, ur: 30 mg/dL — AB
Specific Gravity, Urine: 1.025 (ref 1.005–1.030)
Squamous Epithelial / HPF: NONE SEEN (ref 0–5)
WBC, UA: NONE SEEN WBC/hpf (ref 0–5)
pH: 5 (ref 5.0–8.0)

## 2019-11-04 LAB — COMPREHENSIVE METABOLIC PANEL
ALT: 6 U/L (ref 0–44)
AST: 13 U/L — ABNORMAL LOW (ref 15–41)
Albumin: 3.5 g/dL (ref 3.5–5.0)
Alkaline Phosphatase: 69 U/L (ref 38–126)
Anion gap: 9 (ref 5–15)
BUN: 10 mg/dL (ref 8–23)
CO2: 23 mmol/L (ref 22–32)
Calcium: 8.6 mg/dL — ABNORMAL LOW (ref 8.9–10.3)
Chloride: 103 mmol/L (ref 98–111)
Creatinine, Ser: 0.94 mg/dL (ref 0.44–1.00)
GFR calc Af Amer: >60 mL/min (ref >60)
GFR calc non Af Amer: 59 mL/min — ABNORMAL LOW (ref >60)
Glucose, Bld: 183 mg/dL — ABNORMAL HIGH (ref 70–99)
Potassium: 3.4 mmol/L — ABNORMAL LOW (ref 3.5–5.1)
Sodium: 135 mmol/L (ref 135–145)
Total Bilirubin: 0.8 mg/dL (ref 0.3–1.2)
Total Protein: 7.3 g/dL (ref 6.5–8.1)

## 2019-11-04 LAB — CBC
HCT: 33.4 % — ABNORMAL LOW (ref 36.0–46.0)
Hemoglobin: 9.7 g/dL — ABNORMAL LOW (ref 12.0–15.0)
MCH: 19.4 pg — ABNORMAL LOW (ref 26.0–34.0)
MCHC: 29 g/dL — ABNORMAL LOW (ref 30.0–36.0)
MCV: 66.7 fL — ABNORMAL LOW (ref 80.0–100.0)
Platelets: 380 10*3/uL (ref 150–400)
RBC: 5.01 MIL/uL (ref 3.87–5.11)
RDW: 19.7 % — ABNORMAL HIGH (ref 11.5–15.5)
WBC: 7.7 10*3/uL (ref 4.0–10.5)
nRBC: 0 % (ref 0.0–0.2)

## 2019-11-04 LAB — LIPASE, BLOOD: Lipase: 20 U/L (ref 11–51)

## 2019-11-04 MED ORDER — SODIUM CHLORIDE 0.9 % IV SOLN: Freq: Once | INTRAVENOUS | Status: DC

## 2019-11-04 MED ORDER — SODIUM CHLORIDE 0.9 % IV BOLUS
1000.0000 mL | Freq: Once | INTRAVENOUS | Status: AC
  Administered 2019-11-04: 1000 mL via INTRAVENOUS

## 2019-11-04 MED ORDER — ACETAMINOPHEN 500 MG PO TABS
1000.0000 mg | ORAL_TABLET | Freq: Once | ORAL | Status: AC
  Administered 2019-11-05: 1000 mg via ORAL
  Filled 2019-11-04: qty 2

## 2019-11-04 MED ORDER — ONDANSETRON HCL 4 MG/2ML IJ SOLN: 4.0000 mg | Freq: Once | INTRAMUSCULAR | Status: DC

## 2019-11-04 MED ORDER — ONDANSETRON HCL 4 MG/2ML IJ SOLN
4.0000 mg | Freq: Once | INTRAMUSCULAR | Status: AC
  Administered 2019-11-04: 4 mg via INTRAVENOUS
  Filled 2019-11-04: qty 2

## 2019-11-04 NOTE — ED Triage Notes (Signed)
Pt to triage via wheelchair. Pt has abd pain and vomiting today.  Pt discharged from [facility] last week with similar sx.  Pt alert

## 2019-11-04 NOTE — ED Provider Notes (Signed)
[facility] Emergency Department Provider Note  ____________________________________________   First MD Initiated Contact with Patient 11/04/19 2147     (approximate)  I have reviewed the triage vital signs and the nursing notes.   HISTORY  Chief Complaint Abdominal Pain and Emesis    HPI Carly Cortez is a 79 y.o. female  With h/o vaginal CA, recurrent UTIs, HTN, DM, here with fever, chills. Pt was recently hospitalized for UTI, nausea vomiting. She completed her course of outpt ABX on Saturday. Starting Sunday, she reports she developed progressively worsening chills, nausea, body aches, and nausea with vomiting. She has had ongoing, severe vomiting since then with inability to keep any food or drink down. She has had recurrent dark urine as well, with mild dysuria. NO flank pain. No abdominal pain beyond mild pain along a chronic wound on her abdomen. No other rash/skin lesions. No known COVID exposures. No cough or SOB.        Past Medical History:  Diagnosis Date  . Cancer ([place])    vaginal  . Chronic kidney disease   . Diabetes mellitus without complication ([place])   . Gout   . High cholesterol   . Hypertension     Patient Active Problem List   Diagnosis Date Noted  . Diarrhea   . Chronic renal failure, stage 3a   . UTI (urinary tract infection) 10/28/2019  . Acute gastroenteritis 10/28/2019  . Controlled type 2 diabetes with neuropathy ([place]) 10/28/2019  . History of partial colectomy 10/28/2019  . History of colon cancer 10/28/2019  . History of cancer of vulva 10/28/2019  . Essential hypertension 10/28/2019  . Hyperlipidemia 10/28/2019  . Sepsis ([place]) 10/28/2019  . Acute metabolic encephalopathy [id number]  . CAD (coronary artery disease) 10/28/2019  . Hyperglycemia due to type 2 diabetes mellitus ([place]) 10/28/2019  . Syncope 12/17/2016    Past Surgical History:  Procedure Laterality Date  . ABDOMINAL HYSTERECTOMY    . APPENDECTOMY     . cardiac stents    . CHOLECYSTECTOMY    . HERNIA REPAIR    . PARTIAL COLECTOMY      Prior to Admission medications   Medication Sig Start Date End Date Taking? Authorizing Provider  amLODipine (NORVASC) 2.5 MG tablet Take 2.5 mg by mouth daily. 08/27/19   [provider]  aspirin 81 MG tablet Take 81 mg by mouth every other day.     [provider]  carvedilol (COREG) 25 MG tablet Take 25 mg by mouth 2 (two) times daily.     [provider]  clopidogrel (PLAVIX) 75 MG tablet Take 75 mg by mouth daily. 07/23/19   [provider]  fexofenadine (ALLEGRA) 180 MG tablet Take 180 mg by mouth daily.    [provider]  furosemide (LASIX) 20 MG tablet Take 20 mg by mouth See admin instructions. Take 1 tablet (20mg ) by mouth daily and take 1 additional tablet (20mg ) by mouth daily as needed for fluid    [provider]  insulin glargine (LANTUS) 100 UNIT/ML injection Inject 44 Units into the skin 2 (two) times daily.     [provider]  meloxicam (MOBIC) 15 MG tablet Take 15 mg by mouth daily. 08/02/19   [provider]  nitroGLYCERIN (NITROSTAT) 0.4 MG SL tablet Place 0.4 mg under the tongue every 5 (five) minutes as needed for chest pain.    [provider]  pantoprazole (PROTONIX) 40 MG tablet Take 40 mg by mouth  daily.    [provider]  pregabalin (LYRICA) 50 MG capsule Take 50 mg by mouth 2 (two) times daily.  02/25/19 02/25/20  [provider]  REPATHA SURECLICK [id number] MG/ML SOAJ Inject 140 mg into the skin every 14 (fourteen) days.     [provider]    Allergies Contrast media [iodinated diagnostic agents], Colchicine, Etodolac, Indomethacin, and Oxaprozin  Family History  Problem Relation Age of Onset  . Heart attack Mother   . Diabetes Mother   . Other Father        unknown medical history    Social History Social History   Tobacco Use  . Smoking status: Never Smoker   . Smokeless tobacco: Never Used  Substance Use Topics  . Alcohol use: No  . Drug use: No    Review of Systems  Review of Systems  Constitutional: Positive for chills and fever. Negative for fatigue.  HENT: Negative for congestion and sore throat.   Eyes: Negative for visual disturbance.  Respiratory: Negative for cough and shortness of breath.   Cardiovascular: Negative for chest pain.  Gastrointestinal: Positive for nausea and vomiting. Negative for abdominal pain and diarrhea.  Genitourinary: Positive for dysuria. Negative for flank pain.  Musculoskeletal: Negative for back pain and neck pain.  Skin: Negative for rash and wound.  Neurological: Negative for weakness.     ____________________________________________  PHYSICAL EXAM:      VITAL SIGNS: ED Triage Vitals  Enc Vitals Group     BP 11/04/19 2053 140/80     Pulse Rate 11/04/19 2053 77     Resp 11/04/19 2053 20     Temp 11/04/19 2053 99.5 F (37.5 C)     Temp Source 11/04/19 2053 Oral     SpO2 11/04/19 2053 97 %     Weight 11/04/19 2054 180 lb (81.6 kg)     Height 11/04/19 2054 5\' 6"  (1.676 m)     Head Circumference --      Peak Flow --      Pain Score 11/04/19 2054 8     Pain Loc --      Pain Edu? --      Excl. in [place]? --      Physical Exam Vitals and nursing note reviewed.  Constitutional:      General: She is not in acute distress.    Appearance: She is well-developed.  HENT:     Head: Normocephalic and atraumatic.  Eyes:     Conjunctiva/sclera: Conjunctivae normal.  Cardiovascular:     Rate and Rhythm: Normal rate and regular rhythm.     Heart sounds: Normal heart sounds. No murmur. No friction rub.  Pulmonary:     Effort: Pulmonary effort is normal. No respiratory distress.     Breath sounds: Normal breath sounds. No wheezing or rales.  Abdominal:     General: There is no distension.     Palpations: Abdomen is soft.     Tenderness: There is abdominal tenderness (mild diffuse TTP).      Comments: Chronic superficial wound overlying abdomen, without significant erythema or induration  Musculoskeletal:     Cervical back: Neck supple.  Skin:    General: Skin is warm.     Capillary Refill: Capillary refill takes less than 2 seconds.  Neurological:     Mental Status: She is alert and oriented to person, place, and time.     Motor: No abnormal muscle tone.       ____________________________________________  LABS (all labs ordered are listed, but only abnormal results are displayed)  Labs Reviewed  COMPREHENSIVE METABOLIC PANEL - Abnormal; Notable for the following components:      Result Value   Potassium 3.4 (*)    Glucose, Bld 183 (*)    Calcium 8.6 (*)    AST 13 (*)    GFR calc non Af Amer 59 (*)    All other components within normal limits  CBC - Abnormal; Notable for the following components:   Hemoglobin 9.7 (*)    HCT 33.4 (*)    MCV 66.7 (*)    MCH 19.4 (*)    MCHC 29.0 (*)    RDW 19.7 (*)    All other components within normal limits  URINALYSIS, COMPLETE (UACMP) WITH MICROSCOPIC - Abnormal; Notable for the following components:   Color, Urine YELLOW (*)    APPearance CLEAR (*)    Protein, ur 30 (*)    All other components within normal limits  CULTURE, BLOOD (ROUTINE X 2)  CULTURE, BLOOD (ROUTINE X 2)  URINE CULTURE  LIPASE, BLOOD  PROCALCITONIN  POC SARS CORONAVIRUS 2 AG -  ED    ____________________________________________  EKG: Sinus rhythm, VR 76. PR 120, QRS 128, QTc 477. No acute ST elevations or depressions. LBBB, unchanged from prior. ________________________________________  RADIOLOGY All imaging, including plain films, CT scans, and ultrasounds, independently reviewed by me, and interpretations confirmed via formal radiology reads.  ED MD interpretation:   CXR: Clear CT A/P: Unremarkable  Official radiology report(s): CT ABDOMEN PELVIS WO CONTRAST  Result Date: 11/04/2019 CLINICAL DATA:  79 year old female with abdominal  pain, nausea vomiting. EXAM: CT ABDOMEN AND PELVIS WITHOUT CONTRAST TECHNIQUE: Multidetector CT imaging of the abdomen and pelvis was performed following the standard protocol without IV contrast. COMPARISON:  CT abdomen pelvis dated 10/28/2019. FINDINGS: Lower chest: Partially visualized trace bilateral pleural effusions. There is a 7 mm right lower lobe nodule which was present on the CT abdomen pelvis dated 01/20/2006. Coronary vascular calcification noted. There is no intra-abdominal free air. Hepatobiliary: Slight irregularity of the liver contour may represent early changes of cirrhosis. Clinical correlation is recommended. No intrahepatic biliary ductal dilatation. Cholecystectomy. No retained calcified stone noted in the central CBD. Pancreas: Unremarkable. No pancreatic ductal dilatation or surrounding inflammatory changes. Spleen: Normal in size without focal abnormality. Adrenals/Urinary Tract: The adrenal glands are unremarkable. There is no hydronephrosis or nephrolithiasis on either side. The visualized ureters appear unremarkable. The urinary bladder is collapsed and grossly unremarkable. Stomach/Bowel: There is sigmoid diverticulosis without active inflammatory changes. There is moderate stool throughout the colon. Postsurgical changes of the bowel with anastomotic suture. There is a moderate size hiatal hernia. There is no bowel obstruction or active inflammation. There is a 3.5 cm duodenal diverticulum. Vascular/Lymphatic: Advanced aortoiliac atherosclerotic disease. The IVC is unremarkable. No portal venous gas. There is no adenopathy. Reproductive: Hysterectomy. No adnexal masses. Other: None Musculoskeletal: Osteopenia with degenerative changes of the spine. No acute osseous pathology. IMPRESSION: 1. No acute intra-abdominal or pelvic pathology. 2. Sigmoid diverticulosis. No bowel obstruction. 3. Moderate size hiatal hernia. 4. Partially visualized trace bilateral pleural effusions. 5. Aortic  Atherosclerosis (ICD10-I70.0). Electronically Signed   By: Anner Crete M.D.   On: 11/04/2019 22:35   DG Chest Portable 1 View  Result Date: 11/04/2019 CLINICAL DATA:  Fevers EXAM: PORTABLE CHEST 1 VIEW COMPARISON:  10/28/2019 FINDINGS: Cardiac shadow is enlarged. Large hiatal hernia is seen. Lungs are well aerated bilaterally with mild interstitial changes  no focal infiltrate or sizable effusion is seen. No bony abnormality is noted. IMPRESSION: No acute abnormality seen. Electronically Signed   By: Inez Catalina M.D.   On: 11/04/2019 23:21    ____________________________________________  PROCEDURES   Procedure(s) performed (including Critical Care):  Procedures  ____________________________________________  INITIAL IMPRESSION / MDM / [place] / ED COURSE  As part of my medical decision making, I reviewed the following data within the [place] notes reviewed and incorporated, Old chart reviewed, Notes from prior ED visits, and [place] Controlled Substance Database       *Carly Cortez was evaluated in Emergency Department on 11/05/2019 for the symptoms described in the history of present illness. She was evaluated in the context of the global COVID-19 pandemic, which necessitated consideration that the patient might be at risk for infection with the SARS-CoV-2 virus that causes COVID-19. Institutional protocols and algorithms that pertain to the evaluation of patients at risk for COVID-19 are in a state of rapid change based on information released by regulatory bodies including the CDC and federal and state organizations. These policies and algorithms were followed during the patient's care in the ED.  Some ED evaluations and interventions may be delayed as a result of limited staffing during the pandemic.*     Medical Decision Making:  79 yo F here with fever, chills, nausea and vomiting. Unclear etiology. Her CBC, CMP, and lipase are all at baseline  without acute abnormality. Normal AG, no signs of sepsis beyond fever. UA is without any bacteria, pyuria, or hematuria though she did just complete a course of ABX. CT A/P obtained and shows no acute abnormality (though non con 2/2 allergies), and CXR is clear. She has some superficial breakdown of skin on her abdomen but states this is chronic, and it does not appear to be a significant cellulitis clinically.  Plan to give fluids, antipyretics and antiemetics. If tolerating PO and feeling better with neg procal and reassuring PO challenge, could consider d/c. If she remains nauseous, febrile, or otherwise ill appearing, low threshold for admission. Pt updated and in agreement. Repeat COVID ordered.  ____________________________________________  FINAL CLINICAL IMPRESSION(S) / ED DIAGNOSES  Final diagnoses:  Fever in adult     MEDICATIONS GIVEN DURING THIS VISIT:  Medications  ondansetron (ZOFRAN) injection 4 mg (4 mg Intravenous Given 11/04/19 2356)  sodium chloride 0.9 % bolus 1,000 mL (1,000 mLs Intravenous New Bag/Given 11/04/19 2349)  acetaminophen (TYLENOL) tablet 1,000 mg (1,000 mg Oral Given 11/05/19 0000)     ED Discharge Orders    None       Note:  This document was prepared using Dragon voice recognition software and may include unintentional dictation errors.   Duffy Bruce, MD 11/05/19 [phone]

## 2019-11-04 NOTE — ED Notes (Signed)
EDP at bedside with this RN present for assessment

## 2019-11-05 LAB — LACTIC ACID, PLASMA: Lactic Acid, Venous: 1.4 mmol/L (ref 0.5–1.9)

## 2019-11-05 LAB — SARS CORONAVIRUS 2 (TAT 6-24 HRS): SARS Coronavirus 2: NEGATIVE

## 2019-11-05 LAB — POC SARS CORONAVIRUS 2 AG -  ED: SARS Coronavirus 2 Ag: NEGATIVE

## 2019-11-05 LAB — PROCALCITONIN: Procalcitonin: <0.1 ng/mL

## 2019-11-05 MED ORDER — ONDANSETRON 4 MG PO TBDP: 4.0000 mg | ORAL_TABLET | Freq: Four times a day (QID) | ORAL | 0 refills | Status: DC | PRN

## 2019-11-05 NOTE — ED Provider Notes (Signed)
Vitals:   11/05/19 0030 11/05/19 0109  BP: 123/60 120/68  Pulse: 68 71  Resp: 16 16  Temp:  (!) 100.5 F (38.1 C)  SpO2: 91% 95%     Patient remains low-grade fever, but otherwise normal vital signs.  She is awake alert.  She reports feeling well, she has been able to tolerate taking by mouth after antiemetics.  She is resting comfortably at this time, reports she feels improved.  Discussed with her her results, and on my review I do not see evidence of acute bacterial infection at this time.  She will have blood and urine cultures pending.  Her low procalcitonin is reassuring against possible acute bacterial pneumonia and her rapid Covid test is negative.  Discussed with patient, will follow plan as created with by Dr. Ellender Hose, and I also think this is reasonable that at this point since her symptoms are well controlled she feels comfortable with careful return precautions will discharge her.  She reports that her daughter or husband will come to pick her up.  Return precautions and treatment recommendations and follow-up discussed with the patient who is agreeable with the plan.    Delman Kitten, MD 11/05/19 [phone]

## 2019-11-05 NOTE — ED Notes (Signed)
Pt unable to sign signature pad due to malfunction./ Pt verbalized an understanding of her d/c instructions and rx/ follow up orders. Instructions reviewed with pt's daughter as well. No further questions posed to this RN from either.

## 2019-11-06 LAB — URINE CULTURE

## 2019-11-10 LAB — CULTURE, BLOOD (ROUTINE X 2)
Culture: NO GROWTH
Culture: NO GROWTH
Special Requests: ADEQUATE
Special Requests: ADEQUATE

## 2019-12-14 ENCOUNTER — Ambulatory Visit: Payer: Medicare Other | Attending: Internal Medicine

## 2019-12-14 ENCOUNTER — Other Ambulatory Visit: Payer: Self-pay

## 2019-12-14 DIAGNOSIS — Z23 Encounter for immunization: Secondary | ICD-10-CM

## 2019-12-14 NOTE — Progress Notes (Signed)
   Covid-19 Vaccination Clinic  Name:  Carly Cortez    MRN: [id number] DOB: 11-01-1940  12/14/2019  [name] was observed post Covid-19 immunization for 30 minutes based on pre-vaccination screening without incident. She was provided with Vaccine Information Sheet and instruction to access the V-Safe system.   [name] was instructed to call 911 with any severe reactions post vaccine: [name] Difficulty breathing  . Swelling of face and throat  . A fast heartbeat  . A bad rash all over body  . Dizziness and weakness   Immunizations Administered    Name Date Dose VIS Date Route   Pfizer COVID-19 Vaccine 12/14/2019  5:05 PM 0.3 mL 08/16/2019 Intramuscular   Manufacturer: [place]   Lot: [id number]   [place]: [id number]

## 2020-01-08 ENCOUNTER — Ambulatory Visit: Payer: Medicare Other | Attending: Internal Medicine

## 2020-01-08 DIAGNOSIS — Z23 Encounter for immunization: Secondary | ICD-10-CM

## 2020-01-08 NOTE — Progress Notes (Signed)
   Covid-19 Vaccination Clinic  Name:  Carly Cortez    MRN: [id number] DOB: Jun 05, 1941  01/08/2020  Ms. Marchi was observed post Covid-19 immunization for 30 minutes based on pre-vaccination screening without incident. She was provided with Vaccine Information Sheet and instruction to access the V-Safe system.   Ms. [place] was instructed to call 911 with any severe reactions post vaccine: [name] Difficulty breathing  . Swelling of face and throat  . A fast heartbeat  . A bad rash all over body  . Dizziness and weakness   Immunizations Administered    Name Date Dose VIS Date Route   Pfizer COVID-19 Vaccine 01/08/2020 12:44 PM 0.3 mL 10/30/2018 Intramuscular   Manufacturer: [place]   Lot: [id number]   [place]: [id number]

## 2020-04-12 ENCOUNTER — Inpatient Hospital Stay
Admission: EM | Admit: 2020-04-12 | Discharge: 2020-04-15 | DRG: 291 | Disposition: A | Discharge status: Home Health | Payer: Medicare Other | Attending: Internal Medicine | Admitting: Internal Medicine

## 2020-04-12 ENCOUNTER — Ambulatory Visit (INDEPENDENT_AMBULATORY_CARE_PROVIDER_SITE_OTHER): Payer: Medicare Other

## 2020-04-12 ENCOUNTER — Emergency Department: Payer: Medicare Other

## 2020-04-12 ENCOUNTER — Ambulatory Visit
Admission: EM | Admit: 2020-04-12 | Discharge: 2020-04-12 | Disposition: A | Discharge status: Home / Self Care | Payer: Medicare Other | Source: Home / Self Care | Attending: Family Medicine | Admitting: Family Medicine

## 2020-04-12 ENCOUNTER — Other Ambulatory Visit: Payer: Self-pay

## 2020-04-12 ENCOUNTER — Encounter: Payer: Self-pay | Admitting: Emergency Medicine

## 2020-04-12 DIAGNOSIS — D509 Iron deficiency anemia, unspecified: Secondary | ICD-10-CM | POA: Diagnosis present

## 2020-04-12 DIAGNOSIS — Z833 Family history of diabetes mellitus: Secondary | ICD-10-CM

## 2020-04-12 DIAGNOSIS — E7800 Pure hypercholesterolemia, unspecified: Secondary | ICD-10-CM | POA: Diagnosis present

## 2020-04-12 DIAGNOSIS — N189 Chronic kidney disease, unspecified: Secondary | ICD-10-CM | POA: Diagnosis not present

## 2020-04-12 DIAGNOSIS — D649 Anemia, unspecified: Secondary | ICD-10-CM | POA: Diagnosis present

## 2020-04-12 DIAGNOSIS — Z9049 Acquired absence of other specified parts of digestive tract: Secondary | ICD-10-CM

## 2020-04-12 DIAGNOSIS — J929 Pleural plaque without asbestos: Secondary | ICD-10-CM

## 2020-04-12 DIAGNOSIS — Z8544 Personal history of malignant neoplasm of other female genital organs: Secondary | ICD-10-CM | POA: Diagnosis not present

## 2020-04-12 DIAGNOSIS — J449 Chronic obstructive pulmonary disease, unspecified: Secondary | ICD-10-CM | POA: Diagnosis present

## 2020-04-12 DIAGNOSIS — Z79899 Other long term (current) drug therapy: Secondary | ICD-10-CM | POA: Diagnosis not present

## 2020-04-12 DIAGNOSIS — E785 Hyperlipidemia, unspecified: Secondary | ICD-10-CM | POA: Diagnosis present

## 2020-04-12 DIAGNOSIS — E11649 Type 2 diabetes mellitus with hypoglycemia without coma: Secondary | ICD-10-CM | POA: Diagnosis not present

## 2020-04-12 DIAGNOSIS — J189 Pneumonia, unspecified organism: Secondary | ICD-10-CM

## 2020-04-12 DIAGNOSIS — Z7902 Long term (current) use of antithrombotics/antiplatelets: Secondary | ICD-10-CM

## 2020-04-12 DIAGNOSIS — I959 Hypotension, unspecified: Secondary | ICD-10-CM | POA: Diagnosis present

## 2020-04-12 DIAGNOSIS — Z6831 Body mass index (BMI) 31.0-31.9, adult: Secondary | ICD-10-CM | POA: Diagnosis not present

## 2020-04-12 DIAGNOSIS — I10 Essential (primary) hypertension: Secondary | ICD-10-CM | POA: Diagnosis present

## 2020-04-12 DIAGNOSIS — N1831 Chronic kidney disease, stage 3a: Secondary | ICD-10-CM

## 2020-04-12 DIAGNOSIS — I5032 Chronic diastolic (congestive) heart failure: Secondary | ICD-10-CM

## 2020-04-12 DIAGNOSIS — I2510 Atherosclerotic heart disease of native coronary artery without angina pectoris: Secondary | ICD-10-CM | POA: Diagnosis present

## 2020-04-12 DIAGNOSIS — Z8249 Family history of ischemic heart disease and other diseases of the circulatory system: Secondary | ICD-10-CM

## 2020-04-12 DIAGNOSIS — Z955 Presence of coronary angioplasty implant and graft: Secondary | ICD-10-CM

## 2020-04-12 DIAGNOSIS — Z794 Long term (current) use of insulin: Secondary | ICD-10-CM

## 2020-04-12 DIAGNOSIS — I130 Hypertensive heart and chronic kidney disease with heart failure and stage 1 through stage 4 chronic kidney disease, or unspecified chronic kidney disease: Secondary | ICD-10-CM | POA: Diagnosis present

## 2020-04-12 DIAGNOSIS — Z923 Personal history of irradiation: Secondary | ICD-10-CM

## 2020-04-12 DIAGNOSIS — R4182 Altered mental status, unspecified: Secondary | ICD-10-CM | POA: Diagnosis present

## 2020-04-12 DIAGNOSIS — J9601 Acute respiratory failure with hypoxia: Secondary | ICD-10-CM

## 2020-04-12 DIAGNOSIS — E1122 Type 2 diabetes mellitus with diabetic chronic kidney disease: Secondary | ICD-10-CM | POA: Diagnosis present

## 2020-04-12 DIAGNOSIS — Z90710 Acquired absence of both cervix and uterus: Secondary | ICD-10-CM

## 2020-04-12 DIAGNOSIS — E1165 Type 2 diabetes mellitus with hyperglycemia: Secondary | ICD-10-CM

## 2020-04-12 DIAGNOSIS — E1140 Type 2 diabetes mellitus with diabetic neuropathy, unspecified: Secondary | ICD-10-CM | POA: Diagnosis present

## 2020-04-12 DIAGNOSIS — E6609 Other obesity due to excess calories: Secondary | ICD-10-CM | POA: Diagnosis present

## 2020-04-12 DIAGNOSIS — I5043 Acute on chronic combined systolic (congestive) and diastolic (congestive) heart failure: Secondary | ICD-10-CM

## 2020-04-12 DIAGNOSIS — Z20822 Contact with and (suspected) exposure to covid-19: Secondary | ICD-10-CM | POA: Diagnosis present

## 2020-04-12 DIAGNOSIS — R471 Dysarthria and anarthria: Secondary | ICD-10-CM | POA: Diagnosis present

## 2020-04-12 DIAGNOSIS — Z66 Do not resuscitate: Secondary | ICD-10-CM | POA: Diagnosis present

## 2020-04-12 DIAGNOSIS — N1832 Chronic kidney disease, stage 3b: Secondary | ICD-10-CM | POA: Diagnosis present

## 2020-04-12 DIAGNOSIS — Z791 Long term (current) use of non-steroidal anti-inflammatories (NSAID): Secondary | ICD-10-CM

## 2020-04-12 DIAGNOSIS — I509 Heart failure, unspecified: Secondary | ICD-10-CM

## 2020-04-12 DIAGNOSIS — Z7982 Long term (current) use of aspirin: Secondary | ICD-10-CM

## 2020-04-12 DIAGNOSIS — N179 Acute kidney failure, unspecified: Secondary | ICD-10-CM | POA: Diagnosis present

## 2020-04-12 DIAGNOSIS — I5033 Acute on chronic diastolic (congestive) heart failure: Secondary | ICD-10-CM

## 2020-04-12 DIAGNOSIS — J181 Lobar pneumonia, unspecified organism: Secondary | ICD-10-CM

## 2020-04-12 DIAGNOSIS — K644 Residual hemorrhoidal skin tags: Secondary | ICD-10-CM | POA: Diagnosis present

## 2020-04-12 DIAGNOSIS — I447 Left bundle-branch block, unspecified: Secondary | ICD-10-CM | POA: Diagnosis present

## 2020-04-12 DIAGNOSIS — N952 Postmenopausal atrophic vaginitis: Secondary | ICD-10-CM | POA: Diagnosis present

## 2020-04-12 LAB — CBC WITH DIFFERENTIAL/PLATELET
Abs Immature Granulocytes: 0.02 10*3/uL (ref 0.00–0.07)
Abs Immature Granulocytes: 0.02 10*3/uL (ref 0.00–0.07)
Basophils Absolute: 0 10*3/uL (ref 0.0–0.1)
Basophils Absolute: 0 10*3/uL (ref 0.0–0.1)
Basophils Relative: 1 %
Basophils Relative: 1 %
Eosinophils Absolute: 0.1 10*3/uL (ref 0.0–0.5)
Eosinophils Absolute: 0.1 10*3/uL (ref 0.0–0.5)
Eosinophils Relative: 1 %
Eosinophils Relative: 2 %
HCT: 28.7 % — ABNORMAL LOW (ref 36.0–46.0)
HCT: 27.4 % — ABNORMAL LOW (ref 36.0–46.0)
Hemoglobin: 7.7 g/dL — ABNORMAL LOW (ref 12.0–15.0)
Hemoglobin: 7.3 g/dL — ABNORMAL LOW (ref 12.0–15.0)
Immature Granulocytes: 0 %
Immature Granulocytes: 0 %
Lymphocytes Relative: 15 %
Lymphocytes Relative: 18 %
Lymphs Abs: 0.8 10*3/uL (ref 0.7–4.0)
Lymphs Abs: 1 10*3/uL (ref 0.7–4.0)
MCH: 17.3 pg — ABNORMAL LOW (ref 26.0–34.0)
MCH: 17.3 pg — ABNORMAL LOW (ref 26.0–34.0)
MCHC: 26.8 g/dL — ABNORMAL LOW (ref 30.0–36.0)
MCHC: 26.6 g/dL — ABNORMAL LOW (ref 30.0–36.0)
MCV: 64.6 fL — ABNORMAL LOW (ref 80.0–100.0)
MCV: 64.8 fL — ABNORMAL LOW (ref 80.0–100.0)
Monocytes Absolute: 0.4 10*3/uL (ref 0.1–1.0)
Monocytes Absolute: 0.5 10*3/uL (ref 0.1–1.0)
Monocytes Relative: 7 %
Monocytes Relative: 8 %
Neutro Abs: 4.2 10*3/uL (ref 1.7–7.7)
Neutro Abs: 4.2 10*3/uL (ref 1.7–7.7)
Neutrophils Relative %: 76 %
Neutrophils Relative %: 71 %
Platelets: 350 10*3/uL (ref 150–400)
Platelets: 350 10*3/uL (ref 150–400)
RBC: 4.44 MIL/uL (ref 3.87–5.11)
RBC: 4.23 MIL/uL (ref 3.87–5.11)
RDW: 21.2 % — ABNORMAL HIGH (ref 11.5–15.5)
RDW: 21.2 % — ABNORMAL HIGH (ref 11.5–15.5)
Smear Review: NORMAL
WBC: 5.6 10*3/uL (ref 4.0–10.5)
WBC: 5.9 10*3/uL (ref 4.0–10.5)
nRBC: 0 % (ref 0.0–0.2)
nRBC: 0 % (ref 0.0–0.2)

## 2020-04-12 LAB — COMPREHENSIVE METABOLIC PANEL
ALT: 6 U/L (ref 0–44)
ALT: 6 U/L (ref 0–44)
AST: 12 U/L — ABNORMAL LOW (ref 15–41)
AST: 14 U/L — ABNORMAL LOW (ref 15–41)
Albumin: 3.7 g/dL (ref 3.5–5.0)
Albumin: 3.6 g/dL (ref 3.5–5.0)
Alkaline Phosphatase: 79 U/L (ref 38–126)
Alkaline Phosphatase: 77 U/L (ref 38–126)
Anion gap: 8 (ref 5–15)
Anion gap: 11 (ref 5–15)
BUN: 14 mg/dL (ref 8–23)
BUN: 14 mg/dL (ref 8–23)
CO2: 23 mmol/L (ref 22–32)
CO2: 24 mmol/L (ref 22–32)
Calcium: 8.6 mg/dL — ABNORMAL LOW (ref 8.9–10.3)
Calcium: 8.6 mg/dL — ABNORMAL LOW (ref 8.9–10.3)
Chloride: 108 mmol/L (ref 98–111)
Chloride: 105 mmol/L (ref 98–111)
Creatinine, Ser: 1.31 mg/dL — ABNORMAL HIGH (ref 0.44–1.00)
Creatinine, Ser: 1.27 mg/dL — ABNORMAL HIGH (ref 0.44–1.00)
GFR calc Af Amer: 45 mL/min — ABNORMAL LOW (ref >60)
GFR calc Af Amer: 47 mL/min — ABNORMAL LOW (ref >60)
GFR calc non Af Amer: 39 mL/min — ABNORMAL LOW (ref >60)
GFR calc non Af Amer: 40 mL/min — ABNORMAL LOW (ref >60)
Glucose, Bld: 100 mg/dL — ABNORMAL HIGH (ref 70–99)
Glucose, Bld: 109 mg/dL — ABNORMAL HIGH (ref 70–99)
Potassium: 4.1 mmol/L (ref 3.5–5.1)
Potassium: 3.8 mmol/L (ref 3.5–5.1)
Sodium: 139 mmol/L (ref 135–145)
Sodium: 140 mmol/L (ref 135–145)
Total Bilirubin: 0.9 mg/dL (ref 0.3–1.2)
Total Bilirubin: 1 mg/dL (ref 0.3–1.2)
Total Protein: 7.2 g/dL (ref 6.5–8.1)
Total Protein: 7.1 g/dL (ref 6.5–8.1)

## 2020-04-12 LAB — BLOOD GAS, VENOUS
Acid-base deficit: 1.2 mmol/L (ref 0.0–2.0)
Bicarbonate: 23.6 mmol/L (ref 20.0–28.0)
O2 Saturation: 69.9 %
Patient temperature: 37
pCO2, Ven: 39 mmHg — ABNORMAL LOW (ref 44.0–60.0)
pH, Ven: 7.39 (ref 7.250–7.430)
pO2, Ven: 37 mmHg (ref 32.0–45.0)

## 2020-04-12 LAB — URINALYSIS, COMPLETE (UACMP) WITH MICROSCOPIC
Bilirubin Urine: NEGATIVE
Glucose, UA: NEGATIVE mg/dL
Hgb urine dipstick: NEGATIVE
Ketones, ur: NEGATIVE mg/dL
Nitrite: NEGATIVE
Protein, ur: NEGATIVE mg/dL
Specific Gravity, Urine: 1.016 (ref 1.005–1.030)
pH: 7 (ref 5.0–8.0)

## 2020-04-12 LAB — TROPONIN I (HIGH SENSITIVITY)
Troponin I (High Sensitivity): 10 ng/L (ref <18)
Troponin I (High Sensitivity): 11 ng/L (ref <18)
Troponin I (High Sensitivity): 9 ng/L (ref <18)

## 2020-04-12 LAB — GLUCOSE, CAPILLARY: Glucose-Capillary: 133 mg/dL — ABNORMAL HIGH (ref 70–99)

## 2020-04-12 LAB — IRON AND TIBC
Iron: 17 ug/dL — ABNORMAL LOW (ref 28–170)
Saturation Ratios: 4 % — ABNORMAL LOW (ref 10.4–31.8)
TIBC: 489 ug/dL — ABNORMAL HIGH (ref 250–450)
UIBC: 472 ug/dL

## 2020-04-12 LAB — LIPASE, BLOOD: Lipase: 22 U/L (ref 11–51)

## 2020-04-12 LAB — FOLATE: Folate: 9.4 ng/mL (ref >5.9)

## 2020-04-12 LAB — MAGNESIUM: Magnesium: 2.2 mg/dL (ref 1.7–2.4)

## 2020-04-12 LAB — BRAIN NATRIURETIC PEPTIDE: B Natriuretic Peptide: 447.6 pg/mL — ABNORMAL HIGH (ref 0.0–100.0)

## 2020-04-12 LAB — RETICULOCYTES
Immature Retic Fract: 26.2 % — ABNORMAL HIGH (ref 2.3–15.9)
RBC.: 4.15 MIL/uL (ref 3.87–5.11)
Retic Count, Absolute: 104.2 10*3/uL (ref 19.0–186.0)
Retic Ct Pct: 2.5 % (ref 0.4–3.1)

## 2020-04-12 LAB — SARS CORONAVIRUS 2 BY RT PCR (HOSPITAL ORDER, PERFORMED IN ~~LOC~~ HOSPITAL LAB): SARS Coronavirus 2: NEGATIVE

## 2020-04-12 LAB — FERRITIN: Ferritin: 4 ng/mL — ABNORMAL LOW (ref 11–307)

## 2020-04-12 MED ORDER — CLOPIDOGREL BISULFATE 75 MG PO TABS
75.0000 mg | ORAL_TABLET | Freq: Every day | ORAL | Status: DC
  Administered 2020-04-13: 75 mg via ORAL
  Filled 2020-04-12: qty 1

## 2020-04-12 MED ORDER — SODIUM CHLORIDE 0.9 % IV SOLN
250.0000 mL | INTRAVENOUS | Status: DC | PRN
  Administered 2020-04-13 (×3): 250 mL via INTRAVENOUS

## 2020-04-12 MED ORDER — INSULIN ASPART 100 UNIT/ML ~~LOC~~ SOLN: 0.0000 [IU] | Freq: Every day | SUBCUTANEOUS | Status: DC

## 2020-04-12 MED ORDER — PREGABALIN 50 MG PO CAPS
50.0000 mg | ORAL_CAPSULE | Freq: Two times a day (BID) | ORAL | Status: DC
  Administered 2020-04-12 – 2020-04-15 (×6): 50 mg via ORAL
  Filled 2020-04-12 (×6): qty 1

## 2020-04-12 MED ORDER — ONDANSETRON HCL 4 MG PO TABS: 4.0000 mg | ORAL_TABLET | Freq: Four times a day (QID) | ORAL | Status: DC | PRN

## 2020-04-12 MED ORDER — CARVEDILOL 25 MG PO TABS
25.0000 mg | ORAL_TABLET | Freq: Two times a day (BID) | ORAL | Status: DC
  Administered 2020-04-12 – 2020-04-13 (×2): 25 mg via ORAL
  Filled 2020-04-12 (×2): qty 1

## 2020-04-12 MED ORDER — POLYETHYLENE GLYCOL 3350 17 G PO PACK: 17.0000 g | PACK | Freq: Every day | ORAL | Status: DC | PRN

## 2020-04-12 MED ORDER — SODIUM CHLORIDE 0.9 % IV SOLN
500.0000 mg | Freq: Once | INTRAVENOUS | Status: AC
  Administered 2020-04-12: 500 mg via INTRAVENOUS
  Filled 2020-04-12: qty 500

## 2020-04-12 MED ORDER — FUROSEMIDE 10 MG/ML IJ SOLN
40.0000 mg | Freq: Once | INTRAMUSCULAR | Status: AC
  Administered 2020-04-12: 40 mg via INTRAVENOUS
  Filled 2020-04-12: qty 4

## 2020-04-12 MED ORDER — ENOXAPARIN SODIUM 40 MG/0.4ML ~~LOC~~ SOLN
40.0000 mg | SUBCUTANEOUS | Status: DC
  Administered 2020-04-12: 40 mg via SUBCUTANEOUS
  Filled 2020-04-12: qty 0.4

## 2020-04-12 MED ORDER — HYDRALAZINE HCL 20 MG/ML IJ SOLN: 5.0000 mg | INTRAMUSCULAR | Status: DC | PRN

## 2020-04-12 MED ORDER — SODIUM CHLORIDE 0.9 % IV SOLN
1.0000 g | Freq: Once | INTRAVENOUS | Status: AC
  Administered 2020-04-12: 1 g via INTRAVENOUS
  Filled 2020-04-12: qty 10

## 2020-04-12 MED ORDER — SODIUM CHLORIDE 0.9% FLUSH
3.0000 mL | Freq: Two times a day (BID) | INTRAVENOUS | Status: DC
  Administered 2020-04-12 – 2020-04-15 (×5): 3 mL via INTRAVENOUS

## 2020-04-12 MED ORDER — ASPIRIN EC 81 MG PO TBEC
81.0000 mg | DELAYED_RELEASE_TABLET | ORAL | Status: DC
  Administered 2020-04-13 – 2020-04-15 (×3): 81 mg via ORAL
  Filled 2020-04-12 (×3): qty 1

## 2020-04-12 MED ORDER — BISACODYL 5 MG PO TBEC: 5.0000 mg | DELAYED_RELEASE_TABLET | Freq: Every day | ORAL | Status: DC | PRN

## 2020-04-12 MED ORDER — INSULIN ASPART 100 UNIT/ML ~~LOC~~ SOLN
0.0000 [IU] | Freq: Three times a day (TID) | SUBCUTANEOUS | Status: DC
  Administered 2020-04-13: 2 [IU] via SUBCUTANEOUS
  Administered 2020-04-13: 3 [IU] via SUBCUTANEOUS
  Administered 2020-04-14: 2 [IU] via SUBCUTANEOUS
  Administered 2020-04-15: 3 [IU] via SUBCUTANEOUS
  Filled 2020-04-12 (×3): qty 1

## 2020-04-12 MED ORDER — DOCUSATE SODIUM 100 MG PO CAPS
100.0000 mg | ORAL_CAPSULE | Freq: Two times a day (BID) | ORAL | Status: DC
  Administered 2020-04-12 – 2020-04-15 (×5): 100 mg via ORAL
  Filled 2020-04-12 (×5): qty 1

## 2020-04-12 MED ORDER — ACETAMINOPHEN 325 MG PO TABS: 650.0000 mg | ORAL_TABLET | Freq: Four times a day (QID) | ORAL | Status: DC | PRN

## 2020-04-12 MED ORDER — MORPHINE SULFATE (PF) 2 MG/ML IV SOLN: 2.0000 mg | INTRAVENOUS | Status: DC | PRN

## 2020-04-12 MED ORDER — FUROSEMIDE 10 MG/ML IJ SOLN
40.0000 mg | Freq: Two times a day (BID) | INTRAMUSCULAR | Status: DC
  Administered 2020-04-12 – 2020-04-13 (×2): 40 mg via INTRAVENOUS
  Filled 2020-04-12 (×2): qty 4

## 2020-04-12 MED ORDER — SODIUM CHLORIDE 0.9% FLUSH
3.0000 mL | INTRAVENOUS | Status: DC | PRN
  Administered 2020-04-12: 3 mL via INTRAVENOUS

## 2020-04-12 MED ORDER — LORATADINE 10 MG PO TABS
10.0000 mg | ORAL_TABLET | Freq: Every day | ORAL | Status: DC
  Administered 2020-04-13 – 2020-04-15 (×3): 10 mg via ORAL
  Filled 2020-04-12 (×3): qty 1

## 2020-04-12 MED ORDER — ISOSORBIDE MONONITRATE ER 60 MG PO TB24
120.0000 mg | ORAL_TABLET | Freq: Every day | ORAL | Status: DC
  Administered 2020-04-13 – 2020-04-15 (×3): 120 mg via ORAL
  Filled 2020-04-12 (×3): qty 2

## 2020-04-12 MED ORDER — HYDROCODONE-ACETAMINOPHEN 5-325 MG PO TABS: 1.0000 | ORAL_TABLET | ORAL | Status: DC | PRN

## 2020-04-12 MED ORDER — ACETAMINOPHEN 650 MG RE SUPP: 650.0000 mg | Freq: Four times a day (QID) | RECTAL | Status: DC | PRN

## 2020-04-12 MED ORDER — PANTOPRAZOLE SODIUM 40 MG PO TBEC
40.0000 mg | DELAYED_RELEASE_TABLET | Freq: Every day | ORAL | Status: DC
  Administered 2020-04-13 – 2020-04-15 (×3): 40 mg via ORAL
  Filled 2020-04-12 (×3): qty 1

## 2020-04-12 MED ORDER — INSULIN GLARGINE 100 UNIT/ML ~~LOC~~ SOLN
44.0000 [IU] | Freq: Two times a day (BID) | SUBCUTANEOUS | Status: DC
  Administered 2020-04-13 (×3): 44 [IU] via SUBCUTANEOUS
  Filled 2020-04-12 (×6): qty 0.44

## 2020-04-12 MED ORDER — ONDANSETRON HCL 4 MG/2ML IJ SOLN: 4.0000 mg | Freq: Four times a day (QID) | INTRAMUSCULAR | Status: DC | PRN

## 2020-04-12 MED ORDER — ZOLPIDEM TARTRATE 5 MG PO TABS: 5.0000 mg | ORAL_TABLET | Freq: Every evening | ORAL | Status: DC | PRN

## 2020-04-12 NOTE — H&P (Signed)
History and Physical    Carly Cortez FYB:[id number] DOB: Jan 24, 1941 DOA: 04/12/2020  PCP: Verita Lamb, NP Consultants:  Bobby Rumpf - cardiology; Bae-Jump - Gyn OncPara March - rad onc; Mikle Bosworth - orthopedics Patient coming from:  Home - lives with husband; NOK: Daughter, Annia Belt, [phone]  Chief Complaint: SOB  HPI: Carly Cortez is a 79 y.o. female with medical history significant of HTN; HLD; DM; CKD; CAD; chronic combined CHF; and vulvar cancer presenting with SOB.  She has had some sinus issues.  She was taking an antibiotic for UTI prior to Tuesday.  Saw her PCP and was placed on another antibiotic for sinusitis - inadvertently took both Augmentin and Cefdinir simultaneously for a few doses.  She has been coughing up some mucus, +orthopnea, SOB, CP.  She took a NTG today.  She takes NTG on average 4-5 times a week.  She has not seen blood in her stools (has h/o hemorrhoids, no flare).  She has a h/o anemia, required transfusion in the past with Hgb 4.  She feels mildly SOB now.  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  She took antibiotic for UTI recently and it appears to have cleared up - no current UTI symptoms.      ED Course:  Symptomatic anemia - ?source, heme negative, denies vaginal bleeding. Hgb dropped 2 points.  UA unremarkable, recent UTI.  Sent by EMS from UC for anemia, ?early PNA, sinusitis not improving, L CP today better with NTG.  Work-up reassuring, on 2L (new).  Given Rocephin, Azithro.  Mild volume overload?  BMP 400.  Review of Systems: As per HPI; otherwise review of systems reviewed and negative.   Ambulatory Status:  Ambulates without assistance  COVID Vaccine Status:  Complete  Past Medical History:  Diagnosis Date  . Cancer ([place])     vaginal  . Chronic kidney disease   . Diabetes mellitus without complication ([place])   . Gout   . High cholesterol   . Hypertension     Past Surgical History:  Procedure Laterality Date  . ABDOMINAL HYSTERECTOMY    . APPENDECTOMY    . cardiac stents    . CHOLECYSTECTOMY    . HERNIA REPAIR    . PARTIAL COLECTOMY      Social History   Socioeconomic History  . Marital status: Married    Spouse name: Not on file  . Number of children: Not on file  . Years of education: Not on file  . Highest education level: Not on file  Occupational History  . Not on file  Tobacco Use  . Smoking status: Never Smoker  . Smokeless tobacco: Never Used  Vaping Use  . Vaping Use: Never used  Substance and Sexual Activity  . Alcohol use: No  . Drug use: No  . Sexual activity: Not on file  Other Topics Concern  . Not on file  Social History Narrative  . Not on file   Social Determinants of Health   Financial Resource Strain:   . Difficulty of Paying Living Expenses:   Food Insecurity:   . Worried About Charity fundraiser in the Last Year:   . Arboriculturist in the Last Year:   Transportation Needs:   . Film/video editor (Medical):   [name]  Lack of Transportation (Non-Medical):   Physical Activity:   . Days of Exercise per Week:   . Minutes of Exercise per Session:   Stress:   . Feeling of Stress :   Social Connections:   . Frequency of Communication with Friends and Family:   . Frequency of Social Gatherings with Friends and Family:   . Attends Religious Services:   . Active Member of Clubs or Organizations:   . Attends Archivist Meetings:   [name] Marital Status:   Intimate Partner Violence:   . Fear of Current or Ex-Partner:   . Emotionally Abused:   [name] Physically Abused:   . Sexually Abused:     Allergies  Allergen Reactions  . Contrast Media [Iodinated Diagnostic Agents] Swelling  . Colchicine Other (See Comments)    Gallbladder Problems  . Etodolac Swelling   . Indomethacin Swelling  . Oxaprozin Swelling    Family History  Problem Relation Age of Onset  . Heart attack Mother   . Diabetes Mother   . Other Father        unknown medical history    Prior to Admission medications   Medication Sig Start Date End Date Taking? Authorizing Provider  amLODipine (NORVASC) 2.5 MG tablet Take 2.5 mg by mouth daily. 08/27/19   [provider]  aspirin 81 MG tablet Take 81 mg by mouth every other day.     [provider]  carvedilol (COREG) 25 MG tablet Take 25 mg by mouth 2 (two) times daily.     [provider]  clopidogrel (PLAVIX) 75 MG tablet Take 75 mg by mouth daily. 07/23/19   [provider]  fexofenadine (ALLEGRA) 180 MG tablet Take 180 mg by mouth daily.    [provider]  furosemide (LASIX) 20 MG tablet Take 20 mg by mouth See admin instructions. Take 1 tablet (20mg ) by mouth daily and take 1 additional tablet (20mg ) by mouth daily as needed for fluid    [provider]  insulin glargine (LANTUS) 100 UNIT/ML injection Inject 44 Units into the skin 2 (two) times daily.     [provider]  meloxicam (MOBIC) 15 MG tablet Take 15 mg by mouth daily. 08/02/19   [provider]  nitroGLYCERIN (NITROSTAT) 0.4 MG SL tablet Place 0.4 mg under the tongue every 5 (five) minutes as needed for chest pain.    [provider]  ondansetron (ZOFRAN ODT) 4 MG disintegrating tablet Take 1 tablet (4 mg total) by mouth every 6 (six) hours as needed for nausea or vomiting. 11/05/19   Delman Kitten, MD  pantoprazole (PROTONIX) 40 MG tablet Take 40 mg by mouth daily.    [provider]  pregabalin (LYRICA) 50 MG capsule Take 50 mg by mouth 2 (two) times daily.  02/25/19 02/25/20  [provider]  REPATHA SURECLICK 220 MG/ML SOAJ Inject 140 mg into the skin every 14 (fourteen) days.     [provider]    Physical Exam: Vitals:   04/12/20 1415 04/12/20 1416  04/12/20 1617  BP: 137/71  130/69  Pulse: 72  74  Resp: 20  18  Temp: 98.3 F (36.8 C)    TempSrc: Oral    SpO2: 99%  100%  Weight:  88.5 kg   Height:  5\' 6"  (1.676 m)      . General:  Appears calm and comfortable and is NAD . Eyes:  PERRL, EOMI, normal lids, iris . ENT:  grossly normal hearing,  lips & tongue, mmm; edentulous . Neck:  no LAD, masses or thyromegaly . Cardiovascular:  RRR, no m/r/g. 1+ LE edema.  [name] Respiratory:   Scant bibasilar crackles.  Normal respiratory effort. . Abdomen:  soft, NT, ND, NABS . Back:   normal alignment, no CVAT . Skin:  no rash or induration seen on limited exam . Musculoskeletal:  grossly normal tone BUE/BLE, good ROM, no bony abnormality . Psychiatric:  grossly normal mood and affect, speech fluent and appropriate with raspy voice and mild chronic dysarthria, AOx3 . Neurologic:  CN 2-12 grossly intact, moves all extremities in coordinated fashion    Radiological Exams on Admission: CT ABDOMEN PELVIS WO CONTRAST  Result Date: 04/12/2020 CLINICAL DATA:  Cough. Sinus congestion and drainage for 2 weeks refractory to antibiotic therapy. Left chest discomfort. Anemia. Hypoxia. Acute abdominal pain. EXAM: CT CHEST, ABDOMEN AND PELVIS WITHOUT CONTRAST TECHNIQUE: Multidetector CT imaging of the chest, abdomen and pelvis was performed following the standard protocol without IV contrast. COMPARISON:  Chest radiograph from earlier today. 11/04/2019 CT abdomen/pelvis. FINDINGS: CT CHEST FINDINGS Cardiovascular: Borderline mild cardiomegaly. Trace pericardial effusion/thickening. Three-vessel coronary atherosclerosis. Atherosclerotic nonaneurysmal thoracic aorta. Top-normal caliber main pulmonary artery (3.1 cm diameter). Mediastinum/Nodes: No discrete thyroid nodules. Unremarkable esophagus. No pathologically enlarged axillary, mediastinal or hilar lymph nodes, noting limited sensitivity for the detection of hilar adenopathy on this noncontrast study.  Lungs/Pleura: No pneumothorax. Small dependent bilateral pleural effusions. Mild dependent passive atelectasis in the lower lobes bilaterally. Compressive atelectasis in the medial left lower lobe from the large hiatal hernia. Mild diffuse interlobular septal thickening. Basilar right lower lobe 7 mm solid pulmonary nodule (series 4/image 90) is stable since 12/10/2018 CT and considered benign. No acute consolidative airspace disease, lung masses or new significant pulmonary nodules. Musculoskeletal: No aggressive appearing focal osseous lesions. Moderate lower thoracic spondylosis. CT ABDOMEN PELVIS FINDINGS Hepatobiliary: Normal liver with no liver mass. Cholecystectomy. No biliary ductal dilatation. Pancreas: Normal, with no mass or duct dilation. Spleen: Normal size. No mass. Adrenals/Urinary Tract: Normal adrenals. No hydronephrosis. No renal stones. No contour deforming renal masses. Normal bladder. Stomach/Bowel: Large hiatal hernia. Stomach is nondistended and otherwise normal. Postsurgical changes from subtotal right hemicolectomy with ileocolic anastomosis in the right abdomen. No dilated or thick-walled small bowel loops. Moderate sigmoid diverticulosis. Chronic segmental mild mid sigmoid wall thickening is not substantially changed. No significant acute pericolonic fat stranding. No new large bowel wall thickening. Vascular/Lymphatic: Atherosclerotic nonaneurysmal abdominal aorta. No pathologically enlarged lymph nodes in the abdomen or pelvis. Reproductive: Status post hysterectomy, with no abnormal findings at the vaginal cuff. No adnexal mass. Other: No pneumoperitoneum, ascites or focal fluid collection. Musculoskeletal: No aggressive appearing focal osseous lesions. Mild lumbar spondylosis. IMPRESSION: 1. Borderline mild cardiomegaly. Trace pericardial effusion/thickening. Small dependent bilateral pleural effusions. Mild diffuse interlobular septal thickening, suggesting mild pulmonary edema. These  findings suggest mild congestive heart failure. 2. Three-vessel coronary atherosclerosis. 3. Large hiatal hernia. 4. Evidence of chronic moderate sigmoid diverticulosis. No evidence of acute diverticulitis. No evidence of bowel obstruction or acute bowel inflammation. 5. Aortic Atherosclerosis (ICD10-I70.0). Electronically Signed   By: Ilona Sorrel M.D.   On: 04/12/2020 15:19   DG Chest 2 View  Result Date: 04/12/2020 CLINICAL DATA:  Chest pain. Sinus congestion and drainage started 2 weeks ago. Shortness of breath and chest pain. EXAM: CHEST - 2 VIEW COMPARISON:  11/04/2019 FINDINGS: Heart is enlarged and stable in configuration. Moderate hiatal hernia again noted. The mildly prominent interstitial markings are chronic. There is  more focal opacity at the lung bases, raising the question of early infiltrates. Bilateral basilar pleural thickening or pleural fluid. The appearance favors infectious/inflammatory process over pulmonary edema. Exaggerated thoracic kyphosis and associated degenerative changes for stable. IMPRESSION: 1. Cardiomegaly and hiatal hernia. 2. Early infiltrates and pleural changes at the bases. Electronically Signed   By: Nolon Nations M.D.   On: 04/12/2020 13:10   CT Chest Wo Contrast  Result Date: 04/12/2020 CLINICAL DATA:  Cough. Sinus congestion and drainage for 2 weeks refractory to antibiotic therapy. Left chest discomfort. Anemia. Hypoxia. Acute abdominal pain. EXAM: CT CHEST, ABDOMEN AND PELVIS WITHOUT CONTRAST TECHNIQUE: Multidetector CT imaging of the chest, abdomen and pelvis was performed following the standard protocol without IV contrast. COMPARISON:  Chest radiograph from earlier today. 11/04/2019 CT abdomen/pelvis. FINDINGS: CT CHEST FINDINGS Cardiovascular: Borderline mild cardiomegaly. Trace pericardial effusion/thickening. Three-vessel coronary atherosclerosis. Atherosclerotic nonaneurysmal thoracic aorta. Top-normal caliber main pulmonary artery (3.1 cm diameter).  Mediastinum/Nodes: No discrete thyroid nodules. Unremarkable esophagus. No pathologically enlarged axillary, mediastinal or hilar lymph nodes, noting limited sensitivity for the detection of hilar adenopathy on this noncontrast study. Lungs/Pleura: No pneumothorax. Small dependent bilateral pleural effusions. Mild dependent passive atelectasis in the lower lobes bilaterally. Compressive atelectasis in the medial left lower lobe from the large hiatal hernia. Mild diffuse interlobular septal thickening. Basilar right lower lobe 7 mm solid pulmonary nodule (series 4/image 90) is stable since 12/10/2018 CT and considered benign. No acute consolidative airspace disease, lung masses or new significant pulmonary nodules. Musculoskeletal: No aggressive appearing focal osseous lesions. Moderate lower thoracic spondylosis. CT ABDOMEN PELVIS FINDINGS Hepatobiliary: Normal liver with no liver mass. Cholecystectomy. No biliary ductal dilatation. Pancreas: Normal, with no mass or duct dilation. Spleen: Normal size. No mass. Adrenals/Urinary Tract: Normal adrenals. No hydronephrosis. No renal stones. No contour deforming renal masses. Normal bladder. Stomach/Bowel: Large hiatal hernia. Stomach is nondistended and otherwise normal. Postsurgical changes from subtotal right hemicolectomy with ileocolic anastomosis in the right abdomen. No dilated or thick-walled small bowel loops. Moderate sigmoid diverticulosis. Chronic segmental mild mid sigmoid wall thickening is not substantially changed. No significant acute pericolonic fat stranding. No new large bowel wall thickening. Vascular/Lymphatic: Atherosclerotic nonaneurysmal abdominal aorta. No pathologically enlarged lymph nodes in the abdomen or pelvis. Reproductive: Status post hysterectomy, with no abnormal findings at the vaginal cuff. No adnexal mass. Other: No pneumoperitoneum, ascites or focal fluid collection. Musculoskeletal: No aggressive appearing focal osseous lesions.  Mild lumbar spondylosis. IMPRESSION: 1. Borderline mild cardiomegaly. Trace pericardial effusion/thickening. Small dependent bilateral pleural effusions. Mild diffuse interlobular septal thickening, suggesting mild pulmonary edema. These findings suggest mild congestive heart failure. 2. Three-vessel coronary atherosclerosis. 3. Large hiatal hernia. 4. Evidence of chronic moderate sigmoid diverticulosis. No evidence of acute diverticulitis. No evidence of bowel obstruction or acute bowel inflammation. 5. Aortic Atherosclerosis (ICD10-I70.0). Electronically Signed   By: Ilona Sorrel M.D.   On: 04/12/2020 15:19    EKG: Independently reviewed.   NSR with rate 69; fusion complexes; LBBB; nonspecific ST changes with no evidence of acute ischemia   Labs on Admission: I have personally reviewed the available labs and imaging studies at the time of the admission.  Pertinent labs:   Glucose 109 BUN 14/Creatinine 1.27/GFR 40 - stable BNP 447.6 HS troponin 11, 9 WBC 5.9 Hgb 7.3; 7.7 on 8/8; 9.7 on 3/1 VBG: 7.39/39/23.6 UA: small LE, rare bacteria   Assessment/Plan Principal Problem:   Acute on chronic combined systolic and diastolic CHF (congestive heart failure) (HCC) Active Problems:  Controlled type 2 diabetes with neuropathy (HCC)   History of cancer of vulva   Essential hypertension   Hyperlipidemia   CAD (coronary artery disease)   Chronic renal failure, stage 3a   Anemia   Class 1 obesity due to excess calories with body mass index (BMI) of 31.0 to 31.9 in adult   Acute on chronic CHF -Patient with known h/o chronic combined CHF presenting with worsening SOB and hypoxia, as well as orthopnea -CXR consistent with mild pulmonary edema -Mildly elevated BNP without known baseline -With elevated BNP and abnl CXR, acute decompensated CHF seems probable as diagnosis -Will admit, as per the Emergency HF Mortality Risk Grade.  The patient has: severe pulmonary edema requiring new O2  therapy -Prior echo was in 12/2016 and showed EF 50-55% and grade 1 diastolic dysfunction; will request repeat echocardiogram -Will continue ASA -No ACE due to CKD but she may benefit from a trial of Entresto -Continue Coreg -CHF order set utilized -Cardiology consult - Dr. Clayborn Bigness to see -Was given Lasix 40 mg x 1 in ER and will repeat with 40 mg IV BID -Continue [place] O2 for now -Stable kidney function at this time, will follow  Anemia -MCV is <80 and so this is microcytic anemia -MCV is < 70 indicating probable iron deficiency -With ongoing question, will check ferritin; if <15, diagnostic of iron deficiency; if >100, very unlikely iron deficiency -If ferritin is non-diagnostic, will check transferrin saturation - the lower it is, the more likely this is iron deficiency anemia -There may be a dilutional component -Will recheck in AM; if hgb <7, she likely needs transfusion -She also may benefit from iron infusion prior to d/c  HTN -Hold Norvasc in the setting of mild volume overload -Continue Coreg -Cover with prn IV hydralazine if needed  HLD -Continue Repatha  DM -Last A1c was 7.6, indicating suboptimal control -Continue Lantus -Will cover with moderate-scale SSI for now  Vulvar cancer -s/p radiation treatment, currently stable _last appt was on 7/15 -Estrace cream is used for vulvar/vaginal atrophy; will hold as inpatient  Stage 3a CKD -Appears to be stable at this time - but also appears to be transitioning to stage 3b CKD -Mobic may not be an ideal choice for her  CAD -Continue ASA and Plavix -Continue Imdur  Obesity -Body mass index is 31.47 kg/m. -Weight loss should be encouraged -Outpatient PCP/bariatric medicine f/u encouraged  DNR -I have discussed code status with the patient and her daughter and they are in agreement that the patient would not desire resuscitation and would prefer to die a natural death should that situation arise.   Note: This  patient has been tested and is pending for the novel coronavirus COVID-19.   DVT prophylaxis: Lovenox  Code Status:  DNR - confirmed with patient/daughter Family Communication: Daughter (RT at [facility]) was present throughout evalaution Disposition Plan:  The patient is from: home  Anticipated d/c is to: home, possibly with [facility] services  Anticipated d/c date will depend on clinical response to treatment, likely 2-4 days  Patient is currently: acutely ill Consults called: Cardiology; [facility] team; PT Admission status: Admit - It is my clinical opinion that admission to INPATIENT is reasonable and necessary because this patient will require at least 2 midnights in the hospital to treat this condition based on the medical complexity of the problems presented.  Given the aforementioned information, the predictability of an adverse outcome is felt to be significant.     Karmen Bongo MD  [facility]   How to contact the [facility] Attending or Consulting provider [place] or covering provider during after hours [place], for this patient?  1. Check the care team in [facility] and look for a) attending/consulting [facility] provider listed and b) the [facility] team listed 2. Log into [email or web address] and use [place]'s universal password to access. If you do not have the password, please contact the hospital operator. 3. Locate the [facility] provider you are looking for under [facility] and page to a number that you can be directly reached. 4. If you still have difficulty reaching the provider, please page the [facility] (Director on Call) for the Hospitalists listed on amion for assistance.   04/12/2020, 5:41 PM

## 2020-04-12 NOTE — ED Triage Notes (Signed)
Patient c/o sinus congestion and drainage that started 2 weeks ago.  Patient states that she saw her PCP on Tuesday and was started on an antibiotic.  Patient states that her symptoms have not gotten better.  Patient denies fevers.

## 2020-04-12 NOTE — ED Notes (Signed)
Provider at bedside

## 2020-04-12 NOTE — ED Provider Notes (Signed)
[facility]    CSN: [id number] Arrival date & time: 04/12/20  1109      History   Chief Complaint Chief Complaint  Patient presents with  . Sinus Problem   HPI  79 year old female presents with multiple complaints.  Patient reports that she has had ongoing issues with her sinuses.  She recently saw her PCP on Tuesday and was placed on Omnicef.  She was advised to discontinue Augmentin which was previously prescribed for UTI.  Patient reports that she seems to not be improving.  She continues to have sinus pressure and congestion.  She reports postnasal drip.  She is not been sleeping.  She reports that she has had some emesis due to which she feels as though the postnasal drip/drainage.  Patient also reports that she has had left-sided chest pain.  No relieving factors.  Of note, there has been an issue with her medication.  She has only taken 2 days of Omnicef.  We believe that this is due to continuing to take the Augmentin that was reportedly discontinued.  Past Medical History:  Diagnosis Date  . 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 Chronic kidney disease   . Diabetes mellitus without complication ([place])   . Gout   . High cholesterol   . Hypertension     Patient Active Problem List   Diagnosis Date Noted  . Diarrhea   . Chronic renal failure, stage 3a   . UTI (urinary tract infection) 10/28/2019  . Acute gastroenteritis 10/28/2019  . Controlled type 2 diabetes with neuropathy ([place]) 10/28/2019  . History of partial colectomy 10/28/2019  . History of colon cancer 10/28/2019  . History of cancer of vulva 10/28/2019  . Essential hypertension 10/28/2019  . Hyperlipidemia 10/28/2019  . Sepsis ([place]) 10/28/2019  . Acute metabolic encephalopathy 84/69/6295  . CAD (coronary artery disease) 10/28/2019  . Hyperglycemia due to type 2 diabetes mellitus ([place]) 10/28/2019  . Syncope 12/17/2016    Past Surgical History:  Procedure Laterality Date  . ABDOMINAL HYSTERECTOMY    .  APPENDECTOMY    . cardiac stents    . CHOLECYSTECTOMY    . HERNIA REPAIR    . PARTIAL COLECTOMY      OB History   No obstetric history on file.      Home Medications    Prior to Admission medications   Medication Sig Start Date End Date Taking? Authorizing Provider  amLODipine (NORVASC) 2.5 MG tablet Take 2.5 mg by mouth daily. 08/27/19   [provider]  aspirin 81 MG tablet Take 81 mg by mouth every other day.     [provider]  carvedilol (COREG) 25 MG tablet Take 25 mg by mouth 2 (two) times daily.     [provider]  clopidogrel (PLAVIX) 75 MG tablet Take 75 mg by mouth daily. 07/23/19   [provider]  fexofenadine (ALLEGRA) 180 MG tablet Take 180 mg by mouth daily.    [provider]  furosemide (LASIX) 20 MG tablet Take 20 mg by mouth See admin instructions. Take 1 tablet (20mg ) by mouth daily and take 1 additional tablet (20mg ) by mouth daily as needed for fluid    [provider]  insulin glargine (LANTUS) 100 UNIT/ML injection Inject 44 Units into the skin 2 (two) times daily.     [provider]  meloxicam (MOBIC) 15 MG tablet Take 15 mg by mouth daily. 08/02/19   [provider]  nitroGLYCERIN (NITROSTAT) 0.4  MG SL tablet Place 0.4 mg under the tongue every 5 (five) minutes as needed for chest pain.    [provider]  ondansetron (ZOFRAN ODT) 4 MG disintegrating tablet Take 1 tablet (4 mg total) by mouth every 6 (six) hours as needed for nausea or vomiting. 11/05/19   Delman Kitten, MD  pantoprazole (PROTONIX) 40 MG tablet Take 40 mg by mouth daily.    [provider]  pregabalin (LYRICA) 50 MG capsule Take 50 mg by mouth 2 (two) times daily.  02/25/19 02/25/20  [provider]  REPATHA SURECLICK 449 MG/ML SOAJ Inject 140 mg into the skin every 14 (fourteen) days.     [provider]    Family History Family History  Problem Relation Age of Onset  . Heart attack  Mother   . Diabetes Mother   . Other Father        unknown medical history    Social History Social History   Tobacco Use  . Smoking status: Never Smoker  . Smokeless tobacco: Never Used  Vaping Use  . Vaping Use: Never used  Substance Use Topics  . Alcohol use: No  . Drug use: No     Allergies   Contrast media [iodinated diagnostic agents], Colchicine, Etodolac, Indomethacin, and Oxaprozin   Review of Systems Review of Systems  Constitutional: Negative for fever.  HENT: Positive for postnasal drip, sinus pressure and sinus pain.   Cardiovascular: Positive for chest pain.  Gastrointestinal: Positive for vomiting.   Physical Exam Triage Vital Signs ED Triage Vitals  Enc Vitals Group     BP 04/12/20 1158 110/65     Pulse Rate 04/12/20 1158 68     Resp 04/12/20 1158 17     Temp 04/12/20 1158 98 F (36.7 C)     Temp Source 04/12/20 1158 Oral     SpO2 04/12/20 1158 96 %     Weight 04/12/20 1156 195 lb (88.5 kg)     Height 04/12/20 1156 5\' 6"  (1.676 m)     Head Circumference --      Peak Flow --      Pain Score 04/12/20 1156 0     Pain Loc --      Pain Edu? --      Excl. in [place]? --    Updated Vital Signs BP 110/65 (BP Location: Left Arm)   Pulse 68   Temp 98 F (36.7 C) (Oral)   Resp 17   Ht 5\' 6"  (1.676 m)   Wt 88.5 kg   SpO2 96%   BMI 31.47 kg/m   Visual Acuity Right Eye Distance:   Left Eye Distance:   Bilateral Distance:    Right Eye Near:   Left Eye Near:    Bilateral Near:     Physical Exam Vitals and nursing note reviewed.  Constitutional:      Comments: Chronically ill-appearing.  No acute distress.  HENT:     Head: Normocephalic and atraumatic.  Eyes:     General:        Right eye: No discharge.        Left eye: No discharge.     Conjunctiva/sclera: Conjunctivae normal.  Cardiovascular:     Rate and Rhythm: Normal rate and regular rhythm.  Pulmonary:     Effort: Pulmonary effort is normal.     Breath sounds: Rales present.    Abdominal:     General: There is no distension.     Palpations: Abdomen  is soft.     Tenderness: There is no abdominal tenderness.  Skin:    General: Skin is warm.     Findings: No rash.  Neurological:     Mental Status: She is alert.  Psychiatric:        Mood and Affect: Mood normal.        Behavior: Behavior normal.     UC Treatments / Results  Labs (all labs ordered are listed, but only abnormal results are displayed) Labs Reviewed  CBC WITH DIFFERENTIAL/PLATELET - Abnormal; Notable for the following components:      Result Value   Hemoglobin 7.7 (*)    HCT 28.7 (*)    MCV 64.6 (*)    MCH 17.3 (*)    MCHC 26.8 (*)    RDW 21.2 (*)    All other components within normal limits  COMPREHENSIVE METABOLIC PANEL - Abnormal; Notable for the following components:   Glucose, Bld 100 (*)    Creatinine, Ser 1.31 (*)    Calcium 8.6 (*)    AST 12 (*)    GFR calc non Af Amer 39 (*)    GFR calc Af Amer 45 (*)    All other components within normal limits  TROPONIN I (HIGH SENSITIVITY)    EKG Interpretation: Sinus rhythm at the rate of 69.  Left bundle branch block.  Fusion complexes.  Radiology DG Chest 2 View  Result Date: 04/12/2020 CLINICAL DATA:  Chest pain. Sinus congestion and drainage started 2 weeks ago. Shortness of breath and chest pain. EXAM: CHEST - 2 VIEW COMPARISON:  11/04/2019 FINDINGS: Heart is enlarged and stable in configuration. Moderate hiatal hernia again noted. The mildly prominent interstitial markings are chronic. There is more focal opacity at the lung bases, raising the question of early infiltrates. Bilateral basilar pleural thickening or pleural fluid. The appearance favors infectious/inflammatory process over pulmonary edema. Exaggerated thoracic kyphosis and associated degenerative changes for stable. IMPRESSION: 1. Cardiomegaly and hiatal hernia. 2. Early infiltrates and pleural changes at the bases. Electronically Signed   By: Nolon Nations M.D.   On:  04/12/2020 13:10    Procedures Procedures (including critical care time)  Medications Ordered in UC Medications - No data to display  Initial Impression / Assessment and Plan / UC Course  I have reviewed the triage vital signs and the nursing notes.  Pertinent labs & imaging results that were available during my care of the patient were reviewed by me and considered in my medical decision making (see chart for details).    79 year old female with a complicated past medical history presents with complaints of sinusitis, chest pain, vomiting.  Further work-up today revealed acute kidney injury with an elevated creatinine of 1.31.  Patient anemic with a hemoglobin of 7.7.  Hemoglobin was 9.7 on 3/1.  Chest x-ray obtained and revealed early infiltrates and pleural changes at the bases.  Cardiomegaly as well.  Given her constellation of symptoms and abnormalities as outlined above, patient is being sent to the hospital via EMS.  Patient needs admission.  Final Clinical Impressions(s) / UC Diagnoses   Final diagnoses:  AKI (acute kidney injury) ([place])  Anemia, unspecified type  Pneumonia of both lower lobes due to infectious organism  Pleural thickening   Discharge Instructions   None    ED Prescriptions    None     PDMP not reviewed this encounter.   Coral Spikes, [place] 04/12/20 1410

## 2020-04-12 NOTE — ED Notes (Signed)
Carly Cortez. Daughter.: [phone] Password to give information to family: "Lucious Groves"   Pt states coming in due to some sinus drainage and some shortness of breath with exertion. Pt is on 2LPM via [place] and this is new. Family states pts oxygen does decrease at night, but pt will not wear a cpap mask.

## 2020-04-12 NOTE — ED Provider Notes (Signed)
[facility] Emergency Department Provider Note  ____________________________________________   First MD Initiated Contact with Patient 04/12/20 1414     (approximate)  I have reviewed the triage vital signs and the nursing notes.   HISTORY  Chief Complaint Chest Pain and Abdominal Pain   HPI Carly Cortez is a 79 y.o. 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  Patient states she has had some cough and congestion for going on 2 weeks associated with worsening shortness of breath, nausea, and myalgias.  She states over the last 3 4 days she has developed right-sided abdominal pain and nausea but has not had any vomiting.  She endorses chronic burning with urination and states her chest pain is left-sided lasted a couple of minutes but was relieved with sublingual nitroglycerin.  Did not radiate she does not recall prior to episode.  No other alleviating or any factors.  No prior similar constellation of symptoms.         Past Medical History:  Diagnosis Date  . Cancer ([place])    vaginal  . Chronic kidney disease   . Diabetes mellitus without complication ([place])   . Gout   . High cholesterol   . Hypertension     Patient Active Problem List   Diagnosis Date Noted  . Diarrhea   . Chronic renal failure, stage 3a   . UTI (urinary tract infection) 10/28/2019  . Acute gastroenteritis 10/28/2019  . Controlled type 2 diabetes with neuropathy ([place]) 10/28/2019  . History of partial colectomy 10/28/2019  . History of colon cancer 10/28/2019  . History of cancer of vulva 10/28/2019  . Essential hypertension 10/28/2019  . Hyperlipidemia 10/28/2019  . Sepsis ([place]) 10/28/2019   . Acute metabolic encephalopathy 12/87/8676  . CAD (coronary artery disease) 10/28/2019  . Hyperglycemia due to type 2 diabetes mellitus ([place]) 10/28/2019  . Syncope 12/17/2016    Past Surgical History:  Procedure Laterality Date  . ABDOMINAL HYSTERECTOMY    . APPENDECTOMY    . cardiac stents    . CHOLECYSTECTOMY    . HERNIA REPAIR    . PARTIAL COLECTOMY      Prior to Admission medications   Medication Sig Start Date End Date Taking? Authorizing Provider  amLODipine (NORVASC) 2.5 MG tablet Take 2.5 mg by mouth daily. 08/27/19   [provider]  aspirin 81 MG tablet Take 81 mg by mouth every other day.     [provider]  carvedilol (COREG) 25 MG tablet Take 25 mg by mouth 2 (two) times daily.     [provider]  clopidogrel (PLAVIX) 75 MG tablet Take 75 mg by mouth daily. 07/23/19   [provider]  fexofenadine (ALLEGRA) 180 MG tablet Take 180 mg by mouth daily.    [provider]  furosemide (LASIX) 20 MG tablet Take 20 mg by mouth See admin instructions. Take 1 tablet (20mg ) by mouth daily and take 1 additional tablet (20mg ) by mouth daily as needed for fluid    [provider]  insulin glargine (LANTUS) 100 UNIT/ML injection Inject 44 Units into the skin 2 (two) times daily.     [provider]  meloxicam (  MOBIC) 15 MG tablet Take 15 mg by mouth daily. 08/02/19   [provider]  nitroGLYCERIN (NITROSTAT) 0.4 MG SL tablet Place 0.4 mg under the tongue every 5 (five) minutes as needed for chest pain.    [provider]  ondansetron (ZOFRAN ODT) 4 MG disintegrating tablet Take 1 tablet (4 mg total) by mouth every 6 (six) hours as needed for nausea or vomiting. 11/05/19   Delman Kitten, MD  pantoprazole (PROTONIX) 40 MG tablet Take 40 mg by mouth daily.    [provider]  pregabalin (LYRICA) 50 MG capsule Take 50 mg by mouth 2 (two) times daily.  02/25/19 02/25/20  [provider]  REPATHA  SURECLICK 008 MG/ML SOAJ Inject 140 mg into the skin every 14 (fourteen) days.     [provider]    Allergies Contrast media [iodinated diagnostic agents], Colchicine, Etodolac, Indomethacin, and Oxaprozin  Family History  Problem Relation Age of Onset  . Heart attack Mother   . Diabetes Mother   . Other Father        unknown medical history    Social History Social History   Tobacco Use  . Smoking status: Never Smoker  . Smokeless tobacco: Never Used  Vaping Use  . Vaping Use: Never used  Substance Use Topics  . Alcohol use: No  . Drug use: No    Review of Systems  Review of Systems  Constitutional: Positive for malaise/fatigue. Negative for chills and fever.  HENT: Positive for congestion. Negative for sore throat.   Eyes: Negative for pain.  Respiratory: Positive for cough and shortness of breath. Negative for stridor.   Cardiovascular: Positive for chest pain.  Gastrointestinal: Positive for abdominal pain and nausea. Negative for vomiting.  Genitourinary: Positive for dysuria. Negative for flank pain.  Skin: Negative for rash.  Neurological: Negative for seizures, loss of consciousness and headaches.  Psychiatric/Behavioral: Negative for suicidal ideas.  All other systems reviewed and are negative.     ____________________________________________   PHYSICAL EXAM:  VITAL SIGNS: ED Triage Vitals  Enc Vitals Group     BP      Pulse      Resp      Temp      Temp src      SpO2      Weight      Height      Head Circumference      Peak Flow      Pain Score      Pain Loc      Pain Edu?      Excl. in [place]?    Vitals:   04/12/20 1415  BP: 137/71  Pulse: 72  Resp: 20  Temp: 98.3 F (36.8 C)  SpO2: 99%   Physical Exam Vitals and nursing note reviewed. Exam conducted with a chaperone present.  Constitutional:      General: She is not in acute distress.    Appearance: She is well-developed.  HENT:     Head: Normocephalic and  atraumatic.     Right Ear: External ear normal.     Left Ear: External ear normal.     Nose: Nose normal.  Eyes:     Conjunctiva/sclera: Conjunctivae normal.  Cardiovascular:     Rate and Rhythm: Normal rate and regular rhythm.     Heart sounds: No murmur heard.   Pulmonary:     Effort: Pulmonary effort is normal. No respiratory distress.     Breath sounds: Normal breath  sounds.  Abdominal:     Palpations: Abdomen is soft.     Tenderness: There is abdominal tenderness. There is no right CVA tenderness or left CVA tenderness.  Genitourinary:    Rectum: Guaiac result negative. External hemorrhoid present.  Musculoskeletal:     Cervical back: Neck supple.     Right lower leg: Edema present.     Left lower leg: Edema present.  Skin:    General: Skin is warm and dry.     Capillary Refill: Capillary refill takes less than 2 seconds.  Neurological:     General: No focal deficit present.     Mental Status: She is alert.  Psychiatric:        Mood and Affect: Mood normal.      ____________________________________________   LABS (all labs ordered are listed, but only abnormal results are displayed)  Labs Reviewed  CBC WITH DIFFERENTIAL/PLATELET - Abnormal; Notable for the following components:      Result Value   Hemoglobin 7.3 (*)    HCT 27.4 (*)    MCV 64.8 (*)    MCH 17.3 (*)    MCHC 26.6 (*)    RDW 21.2 (*)    All other components within normal limits  COMPREHENSIVE METABOLIC PANEL - Abnormal; Notable for the following components:   Glucose, Bld 109 (*)    Creatinine, Ser 1.27 (*)    Calcium 8.6 (*)    AST 14 (*)    GFR calc non Af Amer 40 (*)    GFR calc Af Amer 47 (*)    All other components within normal limits  BRAIN NATRIURETIC PEPTIDE - Abnormal; Notable for the following components:   B Natriuretic Peptide 447.6 (*)    All other components within normal limits  URINALYSIS, COMPLETE (UACMP) WITH MICROSCOPIC - Abnormal; Notable for the following components:    Color, Urine YELLOW (*)    APPearance HAZY (*)    Leukocytes,Ua SMALL (*)    Bacteria, UA RARE (*)    All other components within normal limits  CULTURE, BLOOD (ROUTINE X 2)  CULTURE, BLOOD (ROUTINE X 2)  MAGNESIUM  LIPASE, BLOOD  BLOOD GAS, VENOUS  TYPE AND SCREEN  TROPONIN I (HIGH SENSITIVITY)   ____________________________________________  EKG  Sinus rhythm with a ventricular rate of 71, left bundle branch block with a widened QRS at 140, and no other clear evidence of acute ischemia or other underlying arrhythmia.  On review of prior EKGs patient is noted to have left bundle branch block on multiple priors.  No other clear evidence of acute ischemia or other significant underlying arrhythmia on today's EKG. ____________________________________________  [id number]   Official radiology report(s): CT ABDOMEN PELVIS WO CONTRAST  Result Date: 04/12/2020 CLINICAL DATA:  Cough. Sinus congestion and drainage for 2 weeks refractory to antibiotic therapy. Left chest discomfort. Anemia. Hypoxia. Acute abdominal pain. EXAM: CT CHEST, ABDOMEN AND PELVIS WITHOUT CONTRAST TECHNIQUE: Multidetector CT imaging of the chest, abdomen and pelvis was performed following the standard protocol without IV contrast. COMPARISON:  Chest radiograph from earlier today. 11/04/2019 CT abdomen/pelvis. FINDINGS: CT CHEST FINDINGS Cardiovascular: Borderline mild cardiomegaly. Trace pericardial effusion/thickening. Three-vessel coronary atherosclerosis. Atherosclerotic nonaneurysmal thoracic aorta. Top-normal caliber main pulmonary artery (3.1 cm diameter). Mediastinum/Nodes: No discrete thyroid nodules. Unremarkable esophagus. No pathologically enlarged axillary, mediastinal or hilar lymph nodes, noting limited sensitivity for the detection of hilar adenopathy on this noncontrast study. Lungs/Pleura: No pneumothorax. Small dependent bilateral pleural effusions. Mild dependent passive atelectasis in the lower lobes  bilaterally. Compressive atelectasis in the medial left lower lobe from the large hiatal hernia. Mild diffuse interlobular septal thickening. Basilar right lower lobe 7 mm solid pulmonary nodule (series 4/image 90) is stable since 12/10/2018 CT and considered benign. No acute consolidative airspace disease, lung masses or new significant pulmonary nodules. Musculoskeletal: No aggressive appearing focal osseous lesions. Moderate lower thoracic spondylosis. CT ABDOMEN PELVIS FINDINGS Hepatobiliary: Normal liver with no liver mass. Cholecystectomy. No biliary ductal dilatation. Pancreas: Normal, with no mass or duct dilation. Spleen: Normal size. No mass. Adrenals/Urinary Tract: Normal adrenals. No hydronephrosis. No renal stones. No contour deforming renal masses. Normal bladder. Stomach/Bowel: Large hiatal hernia. Stomach is nondistended and otherwise normal. Postsurgical changes from subtotal right hemicolectomy with ileocolic anastomosis in the right abdomen. No dilated or thick-walled small bowel loops. Moderate sigmoid diverticulosis. Chronic segmental mild mid sigmoid wall thickening is not substantially changed. No significant acute pericolonic fat stranding. No new large bowel wall thickening. Vascular/Lymphatic: Atherosclerotic nonaneurysmal abdominal aorta. No pathologically enlarged lymph nodes in the abdomen or pelvis. Reproductive: Status post hysterectomy, with no abnormal findings at the vaginal cuff. No adnexal mass. Other: No pneumoperitoneum, ascites or focal fluid collection. Musculoskeletal: No aggressive appearing focal osseous lesions. Mild lumbar spondylosis. IMPRESSION: 1. Borderline mild cardiomegaly. Trace pericardial effusion/thickening. Small dependent bilateral pleural effusions. Mild diffuse interlobular septal thickening, suggesting mild pulmonary edema. These findings suggest mild congestive heart failure. 2. Three-vessel coronary atherosclerosis. 3. Large hiatal hernia. 4. Evidence of  chronic moderate sigmoid diverticulosis. No evidence of acute diverticulitis. No evidence of bowel obstruction or acute bowel inflammation. 5. Aortic Atherosclerosis (ICD10-I70.0). Electronically Signed   By: Ilona Sorrel M.D.   On: 04/12/2020 15:19   DG Chest 2 View  Result Date: 04/12/2020 CLINICAL DATA:  Chest pain. Sinus congestion and drainage started 2 weeks ago. Shortness of breath and chest pain. EXAM: CHEST - 2 VIEW COMPARISON:  11/04/2019 FINDINGS: Heart is enlarged and stable in configuration. Moderate hiatal hernia again noted. The mildly prominent interstitial markings are chronic. There is more focal opacity at the lung bases, raising the question of early infiltrates. Bilateral basilar pleural thickening or pleural fluid. The appearance favors infectious/inflammatory process over pulmonary edema. Exaggerated thoracic kyphosis and associated degenerative changes for stable. IMPRESSION: 1. Cardiomegaly and hiatal hernia. 2. Early infiltrates and pleural changes at the bases. Electronically Signed   By: Nolon Nations M.D.   On: 04/12/2020 13:10   CT Chest Wo Contrast  Result Date: 04/12/2020 CLINICAL DATA:  Cough. Sinus congestion and drainage for 2 weeks refractory to antibiotic therapy. Left chest discomfort. Anemia. Hypoxia. Acute abdominal pain. EXAM: CT CHEST, ABDOMEN AND PELVIS WITHOUT CONTRAST TECHNIQUE: Multidetector CT imaging of the chest, abdomen and pelvis was performed following the standard protocol without IV contrast. COMPARISON:  Chest radiograph from earlier today. 11/04/2019 CT abdomen/pelvis. FINDINGS: CT CHEST FINDINGS Cardiovascular: Borderline mild cardiomegaly. Trace pericardial effusion/thickening. Three-vessel coronary atherosclerosis. Atherosclerotic nonaneurysmal thoracic aorta. Top-normal caliber main pulmonary artery (3.1 cm diameter). Mediastinum/Nodes: No discrete thyroid nodules. Unremarkable esophagus. No pathologically enlarged axillary, mediastinal or hilar  lymph nodes, noting limited sensitivity for the detection of hilar adenopathy on this noncontrast study. Lungs/Pleura: No pneumothorax. Small dependent bilateral pleural effusions. Mild dependent passive atelectasis in the lower lobes bilaterally. Compressive atelectasis in the medial left lower lobe from the large hiatal hernia. Mild diffuse interlobular septal thickening. Basilar right lower lobe 7 mm solid pulmonary nodule (series 4/image 90) is stable since 12/10/2018 CT and considered benign. No acute consolidative airspace disease, lung  masses or new significant pulmonary nodules. Musculoskeletal: No aggressive appearing focal osseous lesions. Moderate lower thoracic spondylosis. CT ABDOMEN PELVIS FINDINGS Hepatobiliary: Normal liver with no liver mass. Cholecystectomy. No biliary ductal dilatation. Pancreas: Normal, with no mass or duct dilation. Spleen: Normal size. No mass. Adrenals/Urinary Tract: Normal adrenals. No hydronephrosis. No renal stones. No contour deforming renal masses. Normal bladder. Stomach/Bowel: Large hiatal hernia. Stomach is nondistended and otherwise normal. Postsurgical changes from subtotal right hemicolectomy with ileocolic anastomosis in the right abdomen. No dilated or thick-walled small bowel loops. Moderate sigmoid diverticulosis. Chronic segmental mild mid sigmoid wall thickening is not substantially changed. No significant acute pericolonic fat stranding. No new large bowel wall thickening. Vascular/Lymphatic: Atherosclerotic nonaneurysmal abdominal aorta. No pathologically enlarged lymph nodes in the abdomen or pelvis. Reproductive: Status post hysterectomy, with no abnormal findings at the vaginal cuff. No adnexal mass. Other: No pneumoperitoneum, ascites or focal fluid collection. Musculoskeletal: No aggressive appearing focal osseous lesions. Mild lumbar spondylosis. IMPRESSION: 1. Borderline mild cardiomegaly. Trace pericardial effusion/thickening. Small dependent  bilateral pleural effusions. Mild diffuse interlobular septal thickening, suggesting mild pulmonary edema. These findings suggest mild congestive heart failure. 2. Three-vessel coronary atherosclerosis. 3. Large hiatal hernia. 4. Evidence of chronic moderate sigmoid diverticulosis. No evidence of acute diverticulitis. No evidence of bowel obstruction or acute bowel inflammation. 5. Aortic Atherosclerosis (ICD10-I70.0). Electronically Signed   By: Ilona Sorrel M.D.   On: 04/12/2020 15:19    ____________________________________________   PROCEDURES  Procedure(s) performed (including Critical Care):  .Critical Care Performed by: Lucrezia Starch, MD Authorized by: Lucrezia Starch, MD   Critical care provider statement:    Critical care time (minutes):  45   Critical care was necessary to treat or prevent imminent or life-threatening deterioration of the following conditions:  Respiratory failure   Critical care was time spent personally by me on the following activities:  Discussions with consultants, evaluation of patient's response to treatment, examination of patient, ordering and performing treatments and interventions, ordering and review of laboratory studies, ordering and review of radiographic studies, pulse oximetry, re-evaluation of patient's condition, obtaining history from patient or surrogate and review of old charts .1-3 Lead EKG Interpretation Performed by: Lucrezia Starch, MD Authorized by: Lucrezia Starch, MD     Interpretation: normal     ECG rate assessment: normal     Rhythm: sinus rhythm     Ectopy: none     Conduction: normal       ____________________________________________   INITIAL IMPRESSION / ASSESSMENT AND PLAN / ED COURSE        Overall patient's history, exam, and ED work-up is concerning for acute hypoxic respiratory failure likely multifactorial as patient has evidence of acute anemia, volume overload, and history concerning for possible early  pneumonia. Presentation work-up is not consistent with dissection or ACS have a low suspicion for PE at this time.  Patient states abdominal pain unclear etiology as there is no evidence of pancreatitis, pyelonephritis, acute cholestatic process, or diverticulitis. In addition patient has no evidence of bleeding on rectal exam and Hemoccult was negative.  Unclear source for patient's anemia at this time.  We will plan to treat with antibiotics for possible early pneumonia as well as Lasix for concern for mild volume overload. Patient was placed on 2 L nasal cannula which increased her SPO2 saturation from 90% on room air to high 90s. We will plan to admit to hospital service for further evaluation management.  Medications  cefTRIAXone (ROCEPHIN) 1 g  in sodium chloride 0.9 % 100 mL IVPB (has no administration in time range)  azithromycin (ZITHROMAX) 500 mg in sodium chloride 0.9 % 250 mL IVPB (has no administration in time range)  furosemide (LASIX) injection 40 mg (has no administration in time range)             ____________________________________________   FINAL CLINICAL IMPRESSION(S) / ED DIAGNOSES  Final diagnoses:  Acute respiratory failure with hypoxia (HCC)  Pneumonia due to infectious organism, unspecified laterality, unspecified part of lung  Acute on chronic congestive heart failure, unspecified heart failure type ([place])  Low hemoglobin     ED Discharge Orders    None       Note:  This document was prepared using Dragon voice recognition software and may include unintentional dictation errors.   Lucrezia Starch, MD 04/12/20 [id number]

## 2020-04-12 NOTE — ED Triage Notes (Signed)
Pt here via [facility] from urgent care. Pt went to urgent care for sinus congestion and drainage x2 weeks that has been unrelieved by oral abx.   Pt started experiencing left sided chest discomfort, this has relieved now after 1 dose of sublingual nitro. Pt urgent care, hgb and hct are low, and chest x-ray showed a pneumonia.  Room air sats 90% at this time. Pt placed on 2L [place] with sats of 99%. MD Tamala Julian aware.

## 2020-04-13 ENCOUNTER — Inpatient Hospital Stay
Admit: 2020-04-13 | Discharge: 2020-04-13 | Disposition: A | Discharge status: Home / Self Care | Payer: Medicare Other | Attending: Internal Medicine | Admitting: Internal Medicine

## 2020-04-13 DIAGNOSIS — J181 Lobar pneumonia, unspecified organism: Secondary | ICD-10-CM

## 2020-04-13 DIAGNOSIS — I959 Hypotension, unspecified: Secondary | ICD-10-CM

## 2020-04-13 DIAGNOSIS — D509 Iron deficiency anemia, unspecified: Secondary | ICD-10-CM

## 2020-04-13 DIAGNOSIS — J9601 Acute respiratory failure with hypoxia: Secondary | ICD-10-CM

## 2020-04-13 DIAGNOSIS — N179 Acute kidney failure, unspecified: Secondary | ICD-10-CM

## 2020-04-13 LAB — GLUCOSE, CAPILLARY
Glucose-Capillary: 137 mg/dL — ABNORMAL HIGH (ref 70–99)
Glucose-Capillary: 122 mg/dL — ABNORMAL HIGH (ref 70–99)
Glucose-Capillary: 90 mg/dL (ref 70–99)
Glucose-Capillary: 162 mg/dL — ABNORMAL HIGH (ref 70–99)
Glucose-Capillary: 128 mg/dL — ABNORMAL HIGH (ref 70–99)
Glucose-Capillary: 140 mg/dL — ABNORMAL HIGH (ref 70–99)

## 2020-04-13 LAB — VITAMIN B12: Vitamin B-12: 121 pg/mL — ABNORMAL LOW (ref 180–914)

## 2020-04-13 LAB — CBC WITH DIFFERENTIAL/PLATELET
Abs Immature Granulocytes: 0.02 10*3/uL (ref 0.00–0.07)
Basophils Absolute: 0 10*3/uL (ref 0.0–0.1)
Basophils Relative: 0 %
Eosinophils Absolute: 0.1 10*3/uL (ref 0.0–0.5)
Eosinophils Relative: 2 %
HCT: 26.7 % — ABNORMAL LOW (ref 36.0–46.0)
Hemoglobin: 7.2 g/dL — ABNORMAL LOW (ref 12.0–15.0)
Immature Granulocytes: 0 %
Lymphocytes Relative: 18 %
Lymphs Abs: 0.9 10*3/uL (ref 0.7–4.0)
MCH: 17.4 pg — ABNORMAL LOW (ref 26.0–34.0)
MCHC: 27 g/dL — ABNORMAL LOW (ref 30.0–36.0)
MCV: 64.6 fL — ABNORMAL LOW (ref 80.0–100.0)
Monocytes Absolute: 0.5 10*3/uL (ref 0.1–1.0)
Monocytes Relative: 10 %
Neutro Abs: 3.3 10*3/uL (ref 1.7–7.7)
Neutrophils Relative %: 70 %
Platelets: 319 10*3/uL (ref 150–400)
RBC: 4.13 MIL/uL (ref 3.87–5.11)
RDW: 21 % — ABNORMAL HIGH (ref 11.5–15.5)
WBC: 4.8 10*3/uL (ref 4.0–10.5)
nRBC: 0 % (ref 0.0–0.2)

## 2020-04-13 LAB — C DIFFICILE QUICK SCREEN W PCR REFLEX
C Diff antigen: NEGATIVE
C Diff interpretation: NOT DETECTED
C Diff toxin: NEGATIVE

## 2020-04-13 LAB — ECHOCARDIOGRAM COMPLETE
AR max vel: 1.73 cm2
AV Area VTI: 2.7 cm2
AV Area mean vel: 1.77 cm2
AV Mean grad: 3 mmHg
AV Peak grad: 5.3 mmHg
Ao pk vel: 1.16 m/s
Area-P 1/2: 3.2 cm2
Height: 66 in
S' Lateral: 2.99 cm
Weight: 3008 oz

## 2020-04-13 LAB — BASIC METABOLIC PANEL
Anion gap: 10 (ref 5–15)
BUN: 20 mg/dL (ref 8–23)
CO2: 26 mmol/L (ref 22–32)
Calcium: 8.6 mg/dL — ABNORMAL LOW (ref 8.9–10.3)
Chloride: 105 mmol/L (ref 98–111)
Creatinine, Ser: 1.71 mg/dL — ABNORMAL HIGH (ref 0.44–1.00)
GFR calc Af Amer: 33 mL/min — ABNORMAL LOW (ref >60)
GFR calc non Af Amer: 28 mL/min — ABNORMAL LOW (ref >60)
Glucose, Bld: 98 mg/dL (ref 70–99)
Potassium: 3.6 mmol/L (ref 3.5–5.1)
Sodium: 141 mmol/L (ref 135–145)

## 2020-04-13 LAB — ABO/RH: ABO/RH(D): A POS

## 2020-04-13 LAB — PREPARE RBC (CROSSMATCH)

## 2020-04-13 MED ORDER — ACETAMINOPHEN 325 MG PO TABS
650.0000 mg | ORAL_TABLET | Freq: Once | ORAL | Status: AC
  Administered 2020-04-13: 650 mg via ORAL
  Filled 2020-04-13: qty 2

## 2020-04-13 MED ORDER — FUROSEMIDE 10 MG/ML IJ SOLN
20.0000 mg | Freq: Once | INTRAMUSCULAR | Status: AC
  Administered 2020-04-13: 20 mg via INTRAVENOUS
  Filled 2020-04-13: qty 2

## 2020-04-13 MED ORDER — FUROSEMIDE 10 MG/ML IJ SOLN: 40.0000 mg | Freq: Two times a day (BID) | INTRAMUSCULAR | Status: DC

## 2020-04-13 MED ORDER — SODIUM CHLORIDE 0.9 % IV SOLN
400.0000 mg | Freq: Once | INTRAVENOUS | Status: AC
  Administered 2020-04-13: 400 mg via INTRAVENOUS
  Filled 2020-04-13: qty 20

## 2020-04-13 MED ORDER — AZITHROMYCIN 250 MG PO TABS
250.0000 mg | ORAL_TABLET | Freq: Every day | ORAL | Status: DC
  Administered 2020-04-13 – 2020-04-14 (×2): 250 mg via ORAL
  Filled 2020-04-13 (×3): qty 1

## 2020-04-13 MED ORDER — SODIUM CHLORIDE 0.9% IV SOLUTION: Freq: Once | INTRAVENOUS | Status: AC

## 2020-04-13 MED ORDER — ENOXAPARIN SODIUM 30 MG/0.3ML ~~LOC~~ SOLN
30.0000 mg | SUBCUTANEOUS | Status: DC
  Administered 2020-04-13 – 2020-04-14 (×2): 30 mg via SUBCUTANEOUS
  Filled 2020-04-13 (×2): qty 0.3

## 2020-04-13 MED ORDER — CARVEDILOL 12.5 MG PO TABS
12.5000 mg | ORAL_TABLET | Freq: Two times a day (BID) | ORAL | Status: DC
  Administered 2020-04-13: 12.5 mg via ORAL
  Filled 2020-04-13: qty 1

## 2020-04-13 MED ORDER — ORAL CARE MOUTH RINSE
15.0000 mL | Freq: Two times a day (BID) | OROMUCOSAL | Status: DC
  Administered 2020-04-13 – 2020-04-15 (×3): 15 mL via OROMUCOSAL

## 2020-04-13 MED ORDER — SODIUM CHLORIDE 0.9 % IV SOLN
2.0000 g | INTRAVENOUS | Status: DC
  Administered 2020-04-13 – 2020-04-14 (×2): 2 g via INTRAVENOUS
  Filled 2020-04-13: qty 2
  Filled 2020-04-13 (×2): qty 20

## 2020-04-13 NOTE — Progress Notes (Signed)
Report called to [facility] on 2A

## 2020-04-13 NOTE — Progress Notes (Signed)
[facility] visited pt. in response to OR for [facility] support; when [facility] arrived, pt. lying down in bed, awake, grateful to see [facility].  Pt. shared she wanted to have [facility] pray for her and wanted to share what has been going on in her life.  Pt. had heart attack in 2010 and has been in weaker health ever since; she received radiation treatment at [facility] earlier this year for cancer.  In all this, pt. emphasized God has taken care of her: 'He doesn't give [place] more than we can handle,' she said with conviction.  Pt. says she has good support from her family; her husband lives w/her at home and she has two daughters who live close by.  Pt. expressed concern for one daughter currently in hospital for gallbladder operation.   Overall, pt. appears to be coping well and hopes to be discharged tomorrow or the next day; she faces her current and potential health complications with an attitude of acceptance and gratitude for God's help.  [facility] provided prayer for pt.'s further recovery and for God's blessing of her family.  [facility] remains available as needed.

## 2020-04-13 NOTE — Discharge Instructions (Signed)

## 2020-04-13 NOTE — Plan of Care (Signed)
  Problem: Clinical Measurements: Goal: Ability to maintain clinical measurements within normal limits will improve Outcome: Progressing Goal: Respiratory complications will improve Outcome: Progressing Goal: Cardiovascular complication will be avoided Outcome: Progressing   Problem: Activity: Goal: Risk for activity intolerance will decrease Outcome: Progressing   

## 2020-04-13 NOTE — Evaluation (Signed)
Physical Therapy Evaluation Patient Details Name: Carly Cortez MRN: [id number] DOB: [birth year] Today's Date: 04/13/2020   History of Present Illness  presented to ER secondary to SOB, chest pain; admitted for management of acute/chronic CHF.  Clinical Impression  Upon evaluation, patient alert and oriented to basic information; follows commands, eager for OOB activities as able.  Bilat UE/LE strength and ROM grossly symmetrical and WFL; no focal weakness appreciated.  Able to complete bed mobility with mod indep; sit/stand, basic transfers (bed/BSC, BSC/recliner) without assist device, cga/min assist.  Does prefer UE support on external surfaces for optimal stability throughout all transfers.  Patient with onset of significant dizziness with mobility efforts, requiring cessation of mobility and reclining in recliner for recovery.  BP measured at 82/48, sats 94% on 2L.  Patient reporting gradual improvement in symptoms with reclined rest position.  RN/MD informed/aware of patient response to activity; will continue to assess/progress in subsequent sessions as medically appropriate. Would benefit from skilled PT to address above deficits and promote optimal return to PLOF.; Recommend transition to HHPT upon discharge from acute hospitalization.     Follow Up Recommendations Home health PT    Equipment Recommendations  Rolling walker with 5" wheels    Recommendations for Other Services       Precautions / Restrictions Precautions Precautions: Fall Restrictions Weight Bearing Restrictions: No      Mobility  Bed Mobility Overal bed mobility: Modified Independent                Transfers Overall transfer level: Needs assistance   Transfers: Sit to/from Stand Sit to Stand: Min guard         General transfer comment: performed with and without RW, cga; does prefer UE support for optimal stability  Ambulation/Gait             General Gait Details: deferred secondary to  onset of significant dizziness with transition to upright  Stairs            Wheelchair Mobility    Modified Rankin (Stroke Patients Only)       Balance Overall balance assessment: Needs assistance Sitting-balance support: No upper extremity supported;Feet supported Sitting balance-Leahy Scale: Good     Standing balance support: Bilateral upper extremity supported Standing balance-Leahy Scale: Fair                               Pertinent Vitals/Pain Pain Assessment: No/denies pain    Home Living Family/patient expects to be discharged to:: Private residence Living Arrangements: Spouse/significant other Available Help at Discharge: Family Type of Home: House Home Access: Stairs to enter Entrance Stairs-Rails: Right;Left;Can reach both Entrance Stairs-Number of Steps: 4 Home Layout: One level        Prior Function Level of Independence: Independent         Comments: Indep for ADLs, household and community mobilization without assist device; no home O2.  Does endorse at least 1-2 falls in previous six months (one resulting in R wrist fracture, now repaired/clear)     Hand Dominance        Extremity/Trunk Assessment   Upper Extremity Assessment Upper Extremity Assessment: Overall WFL for tasks assessed    Lower Extremity Assessment Lower Extremity Assessment: Overall WFL for tasks assessed (grossly 4+/5 throughout; no focal weakness appreciated)       Communication   Communication: No difficulties  Cognition Arousal/Alertness: Awake/alert Behavior During Therapy: WFL for tasks assessed/performed Overall  Cognitive Status: Within Functional Limits for tasks assessed                                        General Comments      Exercises Other Exercises Other Exercises: Toilet transfer, SPT to [facility] without assist device, cga for balance/safety; does require UE support for optimal stability. Other Exercises: Bed/chair,  SPT without assist device, cga/min assist. Other Exercises: Additional distance limited by dizziness, hypotension with transition to upright; RN/MD informed/aware.   Assessment/Plan    PT Assessment Patient needs continued PT services  PT Problem List Decreased activity tolerance;Decreased balance;Decreased mobility;Decreased coordination;Decreased safety awareness;Decreased knowledge of precautions;Cardiopulmonary status limiting activity       PT Treatment Interventions DME instruction;Gait training;Stair training;Functional mobility training;Patient/family education;Therapeutic activities;Balance training;Therapeutic exercise    PT Goals (Current goals can be found in the Care Plan section)  Acute Rehab PT Goals Patient Stated Goal: to get back home when I'm able PT Goal Formulation: With patient Time For Goal Achievement: 04/27/20 Potential to Achieve Goals: Good    Frequency Min 2X/week   Barriers to discharge        Co-evaluation               AM-PAC PT "6 Clicks" Mobility  Outcome Measure Help needed turning from your back to your side while in a flat bed without using bedrails?: None Help needed moving from lying on your back to sitting on the side of a flat bed without using bedrails?: None Help needed moving to and from a bed to a chair (including a wheelchair)?: A Little Help needed standing up from a chair using your arms (e.g., wheelchair or bedside chair)?: A Little Help needed to walk in hospital room?: A Little Help needed climbing 3-5 steps with a railing? : A Little 6 Click Score: 20    End of Session Equipment Utilized During Treatment: Gait belt Activity Tolerance: Treatment limited secondary to medical complications (Comment) (hypotension) Patient left: in chair;with call bell/phone within reach (alarm pad in place, box not available; RN to locate and place box as appropriate. Patient aware of need for assist with mobility tasks) Nurse  Communication: Mobility status PT Visit Diagnosis: Muscle weakness (generalized) (M62.81);Difficulty in walking, not elsewhere classified (R26.2)    Time: 7341-9379 PT Time Calculation (min) (ACUTE ONLY): 30 min   Charges:   PT Evaluation $PT Eval Moderate Complexity: 1 Mod PT Treatments $Therapeutic Activity: 8-22 mins        Natalee Tomkiewicz H. Owens Shark, PT, DPT, NCS 04/13/20, 12:22 PM [phone]

## 2020-04-13 NOTE — Progress Notes (Signed)
Monitor alarming desat. Patient sats in 80's per monitor. Currently on 2.5 L. Patient sleeping.  Increased to 3 Liters. Sats not improved. Changed probe, increase oxygen to 4L and elevated HOB. Sats improved to 92-93 %. Patient is a mouth breather when sleeping. Patient arousable and alert.  Patient notified of transport to her room.

## 2020-04-13 NOTE — Progress Notes (Signed)
*  PRELIMINARY RESULTS* Echocardiogram 2D Echocardiogram has been performed.  Sherrie Sport 04/13/2020, 9:17 AM

## 2020-04-13 NOTE — Progress Notes (Signed)
Patient ID: Carly Cortez, female   DOB: 1941-02-23, 79 y.o.   MRN: [id number] [facility] PROGRESS NOTE  Carly Cortez PJA:[id number] DOB: [birth year] DOA: 04/12/2020 PCP: Verita Lamb, NP  HPI/Subjective: Patient felt better with regards to her breathing.  Was admitted with shortness of breath and found to be in congestive heart failure.  Patient stated she urinated well.  Late morning her blood pressure dropped when they got her in the chair.  Objective: Vitals:   04/13/20 1532 04/13/20 1547  BP: (!) 99/55 90/65  Pulse: 74 74  Resp: 16   Temp: 99.3 F (37.4 C) 99.3 F (37.4 C)  SpO2: (!) 88% 92%    Intake/Output Summary (Last 24 hours) at 04/13/2020 1601 Last data filed at 04/13/2020 1547 Gross per 24 hour  Intake 560 ml  Output 0 ml  Net 560 ml   Filed Weights   04/12/20 1416 04/13/20 0421  Weight: 88.5 kg 85.3 kg    ROS: Review of Systems  Respiratory: Positive for cough and shortness of breath.   Cardiovascular: Negative for chest pain.  Gastrointestinal: Negative for abdominal pain.   Exam: Physical Exam HENT:     Nose: No mucosal edema.     Mouth/Throat:     Pharynx: No oropharyngeal exudate.  Eyes:     General: Lids are normal.     Conjunctiva/sclera: Conjunctivae normal.     Pupils: Pupils are equal, round, and reactive to light.  Cardiovascular:     Rate and Rhythm: Normal rate and regular rhythm.     Heart sounds: Normal heart sounds, S1 normal and S2 normal.  Pulmonary:     Breath sounds: Examination of the right-lower field reveals decreased breath sounds. Examination of the left-lower field reveals decreased breath sounds. Decreased breath sounds present. No wheezing, rhonchi or rales.  Abdominal:     Palpations: Abdomen is soft.     Tenderness: There is no abdominal tenderness.  Musculoskeletal:     Right lower leg: No swelling.     Left lower leg: No swelling.  Skin:    General: Skin is warm.     Findings: No lesion.  Neurological:      Mental Status: She is alert and oriented to person, place, and time.       Data Reviewed: Basic Metabolic Panel: Recent Labs  Lab 04/12/20 1237 04/12/20 1420 04/13/20 0635  NA 139 140 141  K 4.1 3.8 3.6  CL 108 105 105  CO2 23 24 26   GLUCOSE 100* 109* 98  BUN 14 14 20   CREATININE 1.31* 1.27* 1.71*  CALCIUM 8.6* 8.6* 8.6*  MG  --  2.2  --    Liver Function Tests: Recent Labs  Lab 04/12/20 1237 04/12/20 1420  AST 12* 14*  ALT 6 6  ALKPHOS 79 77  BILITOT 0.9 1.0  PROT 7.2 7.1  ALBUMIN 3.7 3.6   Recent Labs  Lab 04/12/20 1420  LIPASE 22   CBC: Recent Labs  Lab 04/12/20 1237 04/12/20 1420 04/13/20 0635  WBC 5.6 5.9 4.8  NEUTROABS 4.2 4.2 3.3  HGB 7.7* 7.3* 7.2*  HCT 28.7* 27.4* 26.7*  MCV 64.6* 64.8* 64.6*  PLT 350 350 319   BNP (last 3 results) Recent Labs    04/12/20 1420  BNP 447.6*     CBG: Recent Labs  Lab 04/12/20 2114 04/13/20 0148 04/13/20 0403 04/13/20 0749 04/13/20 1143  GLUCAP 133* 137* 122* 90 162*    Recent Results (from the past 240  hour(s))  Blood culture (routine x 2)     Status: None (Preliminary result)   Collection Time: 04/12/20  4:18 PM   Specimen: BLOOD  Result Value Ref Range Status   Specimen Description BLOOD LEFT ANTECUBITAL  Final   Special Requests   Final    BOTTLES DRAWN AEROBIC AND ANAEROBIC Blood Culture adequate volume   Culture   Final    NO GROWTH < 24 HOURS Performed at [facility], [address] [place], [place] [address]    Report Status PENDING  Incomplete  Blood culture (routine x 2)     Status: None (Preliminary result)   Collection Time: 04/12/20  4:18 PM   Specimen: BLOOD  Result Value Ref Range Status   Specimen Description BLOOD RIGHT ANTECUBITAL  Final   Special Requests   Final    BOTTLES DRAWN AEROBIC AND ANAEROBIC Blood Culture adequate volume   Culture   Final    NO GROWTH < 24 HOURS Performed at [facility], [address] [place], [place] [address]     Report Status PENDING  Incomplete  SARS Coronavirus 2 by RT PCR (hospital order, performed in [place] hospital lab) Nasopharyngeal Nasopharyngeal Swab     Status: None   Collection Time: 04/12/20  4:18 PM   Specimen: Nasopharyngeal Swab  Result Value Ref Range Status   SARS Coronavirus 2 NEGATIVE NEGATIVE Final    Comment: (NOTE) SARS-CoV-2 target nucleic acids are NOT DETECTED.  The SARS-CoV-2 RNA is generally detectable in upper and lower respiratory specimens during the acute phase of infection. The lowest concentration of SARS-CoV-2 viral copies this assay can detect is 250 copies / mL. A negative result does not preclude SARS-CoV-2 infection and should not be used as the sole basis for treatment or other patient management decisions.  A negative result may occur with improper specimen collection / handling, submission of specimen other than nasopharyngeal swab, presence of viral mutation(s) within the areas targeted by this assay, and inadequate number of viral copies (<250 copies / mL). A negative result must be combined with clinical observations, patient history, and epidemiological information.  Fact Sheet for Patients:   StrictlyIdeas.no  Fact Sheet for Healthcare Providers: BankingDealers.co.za  This test is not yet approved or  cleared by the [place] FDA and has been authorized for detection and/or diagnosis of SARS-CoV-2 by FDA under an Emergency Use Authorization (EUA).  This EUA will remain in effect (meaning this test can be used) for the duration of the COVID-19 declaration under Section 564(b)(1) of the Act, 21 U.S.C. section 360bbb-3(b)(1), unless the authorization is terminated or revoked sooner.  Performed at [facility], [place], [place] [address]   C Difficile Quick Screen w PCR reflex     Status: None   Collection Time: 04/13/20  9:31 AM   Specimen: STOOL  Result Value  Ref Range Status   C Diff antigen NEGATIVE NEGATIVE Final   C Diff toxin NEGATIVE NEGATIVE Final   C Diff interpretation No C. difficile detected.  Final    Comment: Performed at [facility] [address] [place], [place] [address]     Studies: CT ABDOMEN PELVIS WO CONTRAST  Result Date: 04/12/2020 CLINICAL DATA:  Cough. Sinus congestion and drainage for 2 weeks refractory to antibiotic therapy. Left chest discomfort. Anemia. Hypoxia. Acute abdominal pain. EXAM: CT CHEST, ABDOMEN AND PELVIS WITHOUT CONTRAST TECHNIQUE: Multidetector CT imaging of the chest, abdomen and pelvis was performed following  the standard protocol without IV contrast. COMPARISON:  Chest radiograph from earlier today. 11/04/2019 CT abdomen/pelvis. FINDINGS: CT CHEST FINDINGS Cardiovascular: Borderline mild cardiomegaly. Trace pericardial effusion/thickening. Three-vessel coronary atherosclerosis. Atherosclerotic nonaneurysmal thoracic aorta. Top-normal caliber main pulmonary artery (3.1 cm diameter). Mediastinum/Nodes: No discrete thyroid nodules. Unremarkable esophagus. No pathologically enlarged axillary, mediastinal or hilar lymph nodes, noting limited sensitivity for the detection of hilar adenopathy on this noncontrast study. Lungs/Pleura: No pneumothorax. Small dependent bilateral pleural effusions. Mild dependent passive atelectasis in the lower lobes bilaterally. Compressive atelectasis in the medial left lower lobe from the large hiatal hernia. Mild diffuse interlobular septal thickening. Basilar right lower lobe 7 mm solid pulmonary nodule (series 4/image 90) is stable since 12/10/2018 CT and considered benign. No acute consolidative airspace disease, lung masses or new significant pulmonary nodules. Musculoskeletal: No aggressive appearing focal osseous lesions. Moderate lower thoracic spondylosis. CT ABDOMEN PELVIS FINDINGS Hepatobiliary: Normal liver with no liver mass. Cholecystectomy. No biliary ductal  dilatation. Pancreas: Normal, with no mass or duct dilation. Spleen: Normal size. No mass. Adrenals/Urinary Tract: Normal adrenals. No hydronephrosis. No renal stones. No contour deforming renal masses. Normal bladder. Stomach/Bowel: Large hiatal hernia. Stomach is nondistended and otherwise normal. Postsurgical changes from subtotal right hemicolectomy with ileocolic anastomosis in the right abdomen. No dilated or thick-walled small bowel loops. Moderate sigmoid diverticulosis. Chronic segmental mild mid sigmoid wall thickening is not substantially changed. No significant acute pericolonic fat stranding. No new large bowel wall thickening. Vascular/Lymphatic: Atherosclerotic nonaneurysmal abdominal aorta. No pathologically enlarged lymph nodes in the abdomen or pelvis. Reproductive: Status post hysterectomy, with no abnormal findings at the vaginal cuff. No adnexal mass. Other: No pneumoperitoneum, ascites or focal fluid collection. Musculoskeletal: No aggressive appearing focal osseous lesions. Mild lumbar spondylosis. IMPRESSION: 1. Borderline mild cardiomegaly. Trace pericardial effusion/thickening. Small dependent bilateral pleural effusions. Mild diffuse interlobular septal thickening, suggesting mild pulmonary edema. These findings suggest mild congestive heart failure. 2. Three-vessel coronary atherosclerosis. 3. Large hiatal hernia. 4. Evidence of chronic moderate sigmoid diverticulosis. No evidence of acute diverticulitis. No evidence of bowel obstruction or acute bowel inflammation. 5. Aortic Atherosclerosis (ICD10-I70.0). Electronically Signed   By: Ilona Sorrel M.D.   On: 04/12/2020 15:19   DG Chest 2 View  Result Date: 04/12/2020 CLINICAL DATA:  Chest pain. Sinus congestion and drainage started 2 weeks ago. Shortness of breath and chest pain. EXAM: CHEST - 2 VIEW COMPARISON:  11/04/2019 FINDINGS: Heart is enlarged and stable in configuration. Moderate hiatal hernia again noted. The mildly prominent  interstitial markings are chronic. There is more focal opacity at the lung bases, raising the question of early infiltrates. Bilateral basilar pleural thickening or pleural fluid. The appearance favors infectious/inflammatory process over pulmonary edema. Exaggerated thoracic kyphosis and associated degenerative changes for stable. IMPRESSION: 1. Cardiomegaly and hiatal hernia. 2. Early infiltrates and pleural changes at the bases. Electronically Signed   By: Nolon Nations M.D.   On: 04/12/2020 13:10   CT Chest Wo Contrast  Result Date: 04/12/2020 CLINICAL DATA:  Cough. Sinus congestion and drainage for 2 weeks refractory to antibiotic therapy. Left chest discomfort. Anemia. Hypoxia. Acute abdominal pain. EXAM: CT CHEST, ABDOMEN AND PELVIS WITHOUT CONTRAST TECHNIQUE: Multidetector CT imaging of the chest, abdomen and pelvis was performed following the standard protocol without IV contrast. COMPARISON:  Chest radiograph from earlier today. 11/04/2019 CT abdomen/pelvis. FINDINGS: CT CHEST FINDINGS Cardiovascular: Borderline mild cardiomegaly. Trace pericardial effusion/thickening. Three-vessel coronary atherosclerosis. Atherosclerotic nonaneurysmal thoracic aorta. Top-normal caliber main pulmonary artery (3.1 cm diameter). Mediastinum/Nodes: No discrete  thyroid nodules. Unremarkable esophagus. No pathologically enlarged axillary, mediastinal or hilar lymph nodes, noting limited sensitivity for the detection of hilar adenopathy on this noncontrast study. Lungs/Pleura: No pneumothorax. Small dependent bilateral pleural effusions. Mild dependent passive atelectasis in the lower lobes bilaterally. Compressive atelectasis in the medial left lower lobe from the large hiatal hernia. Mild diffuse interlobular septal thickening. Basilar right lower lobe 7 mm solid pulmonary nodule (series 4/image 90) is stable since 12/10/2018 CT and considered benign. No acute consolidative airspace disease, lung masses or new  significant pulmonary nodules. Musculoskeletal: No aggressive appearing focal osseous lesions. Moderate lower thoracic spondylosis. CT ABDOMEN PELVIS FINDINGS Hepatobiliary: Normal liver with no liver mass. Cholecystectomy. No biliary ductal dilatation. Pancreas: Normal, with no mass or duct dilation. Spleen: Normal size. No mass. Adrenals/Urinary Tract: Normal adrenals. No hydronephrosis. No renal stones. No contour deforming renal masses. Normal bladder. Stomach/Bowel: Large hiatal hernia. Stomach is nondistended and otherwise normal. Postsurgical changes from subtotal right hemicolectomy with ileocolic anastomosis in the right abdomen. No dilated or thick-walled small bowel loops. Moderate sigmoid diverticulosis. Chronic segmental mild mid sigmoid wall thickening is not substantially changed. No significant acute pericolonic fat stranding. No new large bowel wall thickening. Vascular/Lymphatic: Atherosclerotic nonaneurysmal abdominal aorta. No pathologically enlarged lymph nodes in the abdomen or pelvis. Reproductive: Status post hysterectomy, with no abnormal findings at the vaginal cuff. No adnexal mass. Other: No pneumoperitoneum, ascites or focal fluid collection. Musculoskeletal: No aggressive appearing focal osseous lesions. Mild lumbar spondylosis. IMPRESSION: 1. Borderline mild cardiomegaly. Trace pericardial effusion/thickening. Small dependent bilateral pleural effusions. Mild diffuse interlobular septal thickening, suggesting mild pulmonary edema. These findings suggest mild congestive heart failure. 2. Three-vessel coronary atherosclerosis. 3. Large hiatal hernia. 4. Evidence of chronic moderate sigmoid diverticulosis. No evidence of acute diverticulitis. No evidence of bowel obstruction or acute bowel inflammation. 5. Aortic Atherosclerosis (ICD10-I70.0). Electronically Signed   By: Ilona Sorrel M.D.   On: 04/12/2020 15:19   ECHOCARDIOGRAM COMPLETE  Result Date: 04/13/2020    ECHOCARDIOGRAM REPORT    Patient Name:   Carly Cortez Date of Exam: 04/13/2020 Medical Rec #:  [id number]     Height:       66.0 in Accession #:    [id number]    Weight:       188.0 lb Date of Birth:  Jun 26, 1941     BSA:          1.948 m Patient Age:    51 years      BP:           141/62 mmHg Patient Gender: F             HR:           66 bpm. Exam Location:  [facility] Procedure: 2D Echo, Cardiac Doppler and Color Doppler Indications:     CHF- acute systolic 956.21  History:         Patient has prior history of Echocardiogram examinations, most                  recent 12/18/2016. Risk Factors:Hypertension and Diabetes.  Sonographer:     Sherrie Sport RDCS (AE) Referring Phys:  [id number] Karmen Bongo Diagnosing Phys: Serafina Royals MD  Sonographer Comments: Technically challenging study due to limited acoustic windows. IMPRESSIONS  1. Left ventricular ejection fraction, by estimation, is 45 to 50%. The left ventricle has mildly decreased function. The left ventricle demonstrates regional wall motion abnormalities (see scoring diagram/findings for description). Left ventricular diastolic parameters were normal.  2. Right ventricular systolic function is normal. The right ventricular size is normal. There is normal pulmonary artery systolic pressure.  3. The mitral valve is normal in structure. Mild mitral valve regurgitation.  4. The aortic valve is normal in structure. Aortic valve regurgitation is not visualized. FINDINGS  Left Ventricle: Left ventricular ejection fraction, by estimation, is 45 to 50%. The left ventricle has mildly decreased function. The left ventricle demonstrates regional wall motion abnormalities. Moderate hypokinesis of the left ventricular, entire septal wall. The left ventricular internal cavity size was normal in size. There is no left ventricular hypertrophy. Left ventricular diastolic parameters were normal. Right Ventricle: The right ventricular size is normal. No increase in right ventricular wall thickness. Right  ventricular systolic function is normal. There is normal pulmonary artery systolic pressure. The tricuspid regurgitant velocity is 2.20 m/s, and  with an assumed right atrial pressure of 10 mmHg, the estimated right ventricular systolic pressure is 25.3 mmHg. Left Atrium: Left atrial size was normal in size. Right Atrium: Right atrial size was normal in size. Pericardium: There is no evidence of pericardial effusion. Mitral Valve: The mitral valve is normal in structure. Mild mitral valve regurgitation. Tricuspid Valve: The tricuspid valve is normal in structure. Tricuspid valve regurgitation is mild. Aortic Valve: The aortic valve is normal in structure. Aortic valve regurgitation is not visualized. Aortic valve mean gradient measures 3.0 mmHg. Aortic valve peak gradient measures 5.3 mmHg. Aortic valve area, by VTI measures 2.70 cm. Pulmonic Valve: The pulmonic valve was normal in structure. Pulmonic valve regurgitation is not visualized. Aorta: The aortic root and ascending aorta are structurally normal, with no evidence of dilitation. IAS/Shunts: No atrial level shunt detected by color flow Doppler.  LEFT VENTRICLE PLAX 2D LVIDd:         4.40 cm  Diastology LVIDs:         2.99 cm  LV e' lateral:   5.44 cm/s LV PW:         1.56 cm  LV E/e' lateral: 15.7 LV IVS:        1.38 cm  LV e' medial:    3.81 cm/s LVOT diam:     2.20 cm  LV E/e' medial:  22.4 LV SV:         61 LV SV Index:   31 LVOT Area:     3.80 cm  RIGHT VENTRICLE RV Basal diam:  3.02 cm RV S prime:     13.70 cm/s TAPSE (M-mode): 2.8 cm LEFT ATRIUM             Index       RIGHT ATRIUM           Index LA diam:        4.90 cm 2.52 cm/m  RA Area:     14.40 cm LA Vol (A2C):   95.8 ml 49.18 ml/m RA Volume:   32.70 ml  16.79 ml/m LA Vol (A4C):   92.6 ml 47.54 ml/m LA Biplane Vol: 95.6 ml 49.08 ml/m  AORTIC VALVE                   PULMONIC VALVE AV Area (Vmax):    1.73 cm    PV Vmax:        0.74 m/s AV Area (Vmean):   1.77 cm    PV Peak grad:   2.2  mmHg AV Area (VTI):     2.70 cm    RVOT Peak grad: 3 mmHg AV  Vmax:           115.50 cm/s AV Vmean:          80.700 cm/s AV VTI:            0.226 m AV Peak Grad:      5.3 mmHg AV Mean Grad:      3.0 mmHg LVOT Vmax:         52.50 cm/s LVOT Vmean:        37.600 cm/s LVOT VTI:          0.161 m LVOT/AV VTI ratio: 0.71  AORTA Ao Root diam: 3.00 cm MITRAL VALVE                TRICUSPID VALVE MV Area (PHT): 3.20 cm     TR Peak grad:   19.4 mmHg MV Decel Time: 237 msec     TR Vmax:        220.00 cm/s MV E velocity: 85.40 cm/s MV A velocity: 101.00 cm/s  SHUNTS MV E/A ratio:  0.85         Systemic VTI:  0.16 m                             Systemic Diam: 2.20 cm Serafina Royals MD Electronically signed by Serafina Royals MD Signature Date/Time: 04/13/2020/3:43:14 PM    Final     Scheduled Meds: . aspirin EC  81 mg Oral QODAY  . azithromycin  250 mg Oral q1800  . carvedilol  12.5 mg Oral BID  . clopidogrel  75 mg Oral Daily  . docusate sodium  100 mg Oral BID  . enoxaparin (LOVENOX) injection  30 mg Subcutaneous Q24H  . insulin aspart  0-15 Units Subcutaneous TID WC  . insulin aspart  0-5 Units Subcutaneous QHS  . insulin glargine  44 Units Subcutaneous BID  . isosorbide mononitrate  120 mg Oral Daily  . loratadine  10 mg Oral Daily  . mouth rinse  15 mL Mouth Rinse BID  . pantoprazole  40 mg Oral Daily  . pregabalin  50 mg Oral BID  . sodium chloride flush  3 mL Intravenous Q12H   Continuous Infusions: . sodium chloride 250 mL (04/13/20 1529)  . cefTRIAXone (ROCEPHIN)  IV      Assessment/Plan:  1. Acute on chronic diastolic congestive heart failure.  Hold Lasix with acute kidney injury. 2. Pneumonia seen on chest x-ray started Rocephin and Zithromax. 3. Hypotension this afternoon.  Holding parameters on Coreg.  Transfuse a unit of packed red blood cells.  Was receiving IV iron. 4. Anemia iron deficiency.  On aspirin and Plavix.  Will guaiac stool.  Transfuse 1 unit of packed red blood cells.   Benefits and risks explained to patient earlier.  Will also give IV iron. 5. 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  Check kidney function again tomorrow. 6. Acute hypoxic respiratory failure.  Patient was on 4 L when I saw her this morning.  Asked nurse to try to taper down. 7. 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  Continue pregabalin 8. Vulvar cancer history 9. Weakness physical therapy evaluation    Code Status:     Code Status Orders  (From admission, onward)         Start     Ordered   04/12/20 1704  Do not attempt resuscitation (DNR)  Continuous  Question Answer Comment  In the event of cardiac or respiratory ARREST Do not call a "code blue"   In the event of cardiac or respiratory ARREST Do not perform Intubation, CPR, defibrillation or ACLS   In the event of cardiac or respiratory ARREST Use medication by any route, position, wound care, and other measures to relive pain and suffering. May use oxygen, suction and manual treatment of airway obstruction as needed for comfort.      04/12/20 1704        Code Status History    Date Active Date Inactive Code Status Order ID Comments User Context   10/28/2019 2027 10/30/2019 1955 Full Code [id number]  Athena Masse, MD ED   12/17/2016 1533 12/18/2016 1837 Full Code [id number]  Bettey Costa, MD Inpatient   Advance Care Planning Activity    Advance Directive Documentation     Most Recent Value  Type of Advance Directive Healthcare Power of [place], Living will  Pre-existing out of facility DNR order (yellow form or pink MOST form) --  "MOST" Form in Place? --     Family Communication: Spoke with daughter on the phone Disposition Plan: Status is: Inpatient  Dispo: The patient is from: Home              Anticipated d/c is to: Home              Anticipated d/c date is: Likely will need a few days balancing out acute kidney injury and congestive  heart failure and blood loss anemia.              Patient currently receiving a transfusion of packed red blood cells today and IV iron.  Patient also hypotensive this morning and worsening kidney function.  Consultants:  Cardiology  Time spent: 27 minutes  [place]

## 2020-04-13 NOTE — Progress Notes (Signed)
[facility] Cardiology    SUBJECTIVE: Patient feels much improved less shortness of breath sitting up in bed eating breakfast no chest pain questioning whether she can go home.  Patient is hoping to go home as early as tomorrow.  Case discussed with daughter   Vitals:   04/13/20 0353 04/13/20 0408 04/13/20 0421 04/13/20 0750  BP:  108/64  (!) 141/62  Pulse: 69 66  66  Resp: 19 19  17   Temp:  98.6 F (37 C)  97.8 F (36.6 C)  TempSrc:  Oral    SpO2: 96% 97%  98%  Weight:   85.3 kg   Height:   5\' 6"  (1.676 m)      Intake/Output Summary (Last 24 hours) at 04/13/2020 3382 Last data filed at 04/13/2020 5053 Gross per 24 hour  Intake --  Output 0 ml  Net 0 ml      PHYSICAL EXAM  General: Well developed, well nourished, in no acute distress HEENT:  Normocephalic and atramatic Neck:  No JVD.  Lungs: Clear bilaterally to auscultation and percussion. Heart: HRRR . Normal S1 and S2 without gallops or murmurs.  Abdomen: Bowel sounds are positive, abdomen soft and non-tender  Msk:  Back normal, normal gait. Normal strength and tone for age. Extremities: No clubbing, cyanosis or edema.   Neuro: Alert and oriented X 3. Psych:  Good affect, responds appropriately   LABS: Basic Metabolic Panel: Recent Labs    04/12/20 1420 04/13/20 0635  NA 140 141  K 3.8 3.6  CL 105 105  CO2 24 26  GLUCOSE 109* 98  BUN 14 20  CREATININE 1.27* 1.71*  CALCIUM 8.6* 8.6*  MG 2.2  --    Liver Function Tests: Recent Labs    04/12/20 1237 04/12/20 1420  AST 12* 14*  ALT 6 6  ALKPHOS 79 77  BILITOT 0.9 1.0  PROT 7.2 7.1  ALBUMIN 3.7 3.6   Recent Labs    04/12/20 1420  LIPASE 22   CBC: Recent Labs    04/12/20 1420 04/13/20 0635  WBC 5.9 4.8  NEUTROABS 4.2 3.3  HGB 7.3* 7.2*  HCT 27.4* 26.7*  MCV 64.8* 64.6*  PLT 350 319   Cardiac Enzymes: No results for input(s): CKTOTAL, CKMB, CKMBINDEX, TROPONINI in the last 72 hours. BNP: Invalid input(s): POCBNP D-Dimer: No results for  input(s): DDIMER in the last 72 hours. Hemoglobin A1C: No results for input(s): HGBA1C in the last 72 hours. Fasting Lipid Panel: No results for input(s): CHOL, HDL, LDLCALC, TRIG, CHOLHDL, LDLDIRECT in the last 72 hours. Thyroid Function Tests: No results for input(s): TSH, T4TOTAL, T3FREE, THYROIDAB in the last 72 hours.  Invalid input(s): FREET3 Anemia Panel: Recent Labs    04/12/20 1858  VITAMINB12 121*  FOLATE 9.4  FERRITIN 4*  TIBC 489*  IRON 17*  RETICCTPCT 2.5    CT ABDOMEN PELVIS WO CONTRAST  Result Date: 04/12/2020 CLINICAL DATA:  Cough. Sinus congestion and drainage for 2 weeks refractory to antibiotic therapy. Left chest discomfort. Anemia. Hypoxia. Acute abdominal pain. EXAM: CT CHEST, ABDOMEN AND PELVIS WITHOUT CONTRAST TECHNIQUE: Multidetector CT imaging of the chest, abdomen and pelvis was performed following the standard protocol without IV contrast. COMPARISON:  Chest radiograph from earlier today. 11/04/2019 CT abdomen/pelvis. FINDINGS: CT CHEST FINDINGS Cardiovascular: Borderline mild cardiomegaly. Trace pericardial effusion/thickening. Three-vessel coronary atherosclerosis. Atherosclerotic nonaneurysmal thoracic aorta. Top-normal caliber main pulmonary artery (3.1 cm diameter). Mediastinum/Nodes: No discrete thyroid nodules. Unremarkable esophagus. No pathologically enlarged axillary, mediastinal or hilar lymph  nodes, noting limited sensitivity for the detection of hilar adenopathy on this noncontrast study. Lungs/Pleura: No pneumothorax. Small dependent bilateral pleural effusions. Mild dependent passive atelectasis in the lower lobes bilaterally. Compressive atelectasis in the medial left lower lobe from the large hiatal hernia. Mild diffuse interlobular septal thickening. Basilar right lower lobe 7 mm solid pulmonary nodule (series 4/image 90) is stable since 12/10/2018 CT and considered benign. No acute consolidative airspace disease, lung masses or new significant  pulmonary nodules. Musculoskeletal: No aggressive appearing focal osseous lesions. Moderate lower thoracic spondylosis. CT ABDOMEN PELVIS FINDINGS Hepatobiliary: Normal liver with no liver mass. Cholecystectomy. No biliary ductal dilatation. Pancreas: Normal, with no mass or duct dilation. Spleen: Normal size. No mass. Adrenals/Urinary Tract: Normal adrenals. No hydronephrosis. No renal stones. No contour deforming renal masses. Normal bladder. Stomach/Bowel: Large hiatal hernia. Stomach is nondistended and otherwise normal. Postsurgical changes from subtotal right hemicolectomy with ileocolic anastomosis in the right abdomen. No dilated or thick-walled small bowel loops. Moderate sigmoid diverticulosis. Chronic segmental mild mid sigmoid wall thickening is not substantially changed. No significant acute pericolonic fat stranding. No new large bowel wall thickening. Vascular/Lymphatic: Atherosclerotic nonaneurysmal abdominal aorta. No pathologically enlarged lymph nodes in the abdomen or pelvis. Reproductive: Status post hysterectomy, with no abnormal findings at the vaginal cuff. No adnexal mass. Other: No pneumoperitoneum, ascites or focal fluid collection. Musculoskeletal: No aggressive appearing focal osseous lesions. Mild lumbar spondylosis. IMPRESSION: 1. Borderline mild cardiomegaly. Trace pericardial effusion/thickening. Small dependent bilateral pleural effusions. Mild diffuse interlobular septal thickening, suggesting mild pulmonary edema. These findings suggest mild congestive heart failure. 2. Three-vessel coronary atherosclerosis. 3. Large hiatal hernia. 4. Evidence of chronic moderate sigmoid diverticulosis. No evidence of acute diverticulitis. No evidence of bowel obstruction or acute bowel inflammation. 5. Aortic Atherosclerosis (ICD10-I70.0). Electronically Signed   By: Ilona Sorrel M.D.   On: 04/12/2020 15:19   DG Chest 2 View  Result Date: 04/12/2020 CLINICAL DATA:  Chest pain. Sinus congestion  and drainage started 2 weeks ago. Shortness of breath and chest pain. EXAM: CHEST - 2 VIEW COMPARISON:  11/04/2019 FINDINGS: Heart is enlarged and stable in configuration. Moderate hiatal hernia again noted. The mildly prominent interstitial markings are chronic. There is more focal opacity at the lung bases, raising the question of early infiltrates. Bilateral basilar pleural thickening or pleural fluid. The appearance favors infectious/inflammatory process over pulmonary edema. Exaggerated thoracic kyphosis and associated degenerative changes for stable. IMPRESSION: 1. Cardiomegaly and hiatal hernia. 2. Early infiltrates and pleural changes at the bases. Electronically Signed   By: Nolon Nations M.D.   On: 04/12/2020 13:10   CT Chest Wo Contrast  Result Date: 04/12/2020 CLINICAL DATA:  Cough. Sinus congestion and drainage for 2 weeks refractory to antibiotic therapy. Left chest discomfort. Anemia. Hypoxia. Acute abdominal pain. EXAM: CT CHEST, ABDOMEN AND PELVIS WITHOUT CONTRAST TECHNIQUE: Multidetector CT imaging of the chest, abdomen and pelvis was performed following the standard protocol without IV contrast. COMPARISON:  Chest radiograph from earlier today. 11/04/2019 CT abdomen/pelvis. FINDINGS: CT CHEST FINDINGS Cardiovascular: Borderline mild cardiomegaly. Trace pericardial effusion/thickening. Three-vessel coronary atherosclerosis. Atherosclerotic nonaneurysmal thoracic aorta. Top-normal caliber main pulmonary artery (3.1 cm diameter). Mediastinum/Nodes: No discrete thyroid nodules. Unremarkable esophagus. No pathologically enlarged axillary, mediastinal or hilar lymph nodes, noting limited sensitivity for the detection of hilar adenopathy on this noncontrast study. Lungs/Pleura: No pneumothorax. Small dependent bilateral pleural effusions. Mild dependent passive atelectasis in the lower lobes bilaterally. Compressive atelectasis in the medial left lower lobe from the large hiatal hernia. Mild  diffuse interlobular septal thickening. Basilar right lower lobe 7 mm solid pulmonary nodule (series 4/image 90) is stable since 12/10/2018 CT and considered benign. No acute consolidative airspace disease, lung masses or new significant pulmonary nodules. Musculoskeletal: No aggressive appearing focal osseous lesions. Moderate lower thoracic spondylosis. CT ABDOMEN PELVIS FINDINGS Hepatobiliary: Normal liver with no liver mass. Cholecystectomy. No biliary ductal dilatation. Pancreas: Normal, with no mass or duct dilation. Spleen: Normal size. No mass. Adrenals/Urinary Tract: Normal adrenals. No hydronephrosis. No renal stones. No contour deforming renal masses. Normal bladder. Stomach/Bowel: Large hiatal hernia. Stomach is nondistended and otherwise normal. Postsurgical changes from subtotal right hemicolectomy with ileocolic anastomosis in the right abdomen. No dilated or thick-walled small bowel loops. Moderate sigmoid diverticulosis. Chronic segmental mild mid sigmoid wall thickening is not substantially changed. No significant acute pericolonic fat stranding. No new large bowel wall thickening. Vascular/Lymphatic: Atherosclerotic nonaneurysmal abdominal aorta. No pathologically enlarged lymph nodes in the abdomen or pelvis. Reproductive: Status post hysterectomy, with no abnormal findings at the vaginal cuff. No adnexal mass. Other: No pneumoperitoneum, ascites or focal fluid collection. Musculoskeletal: No aggressive appearing focal osseous lesions. Mild lumbar spondylosis. IMPRESSION: 1. Borderline mild cardiomegaly. Trace pericardial effusion/thickening. Small dependent bilateral pleural effusions. Mild diffuse interlobular septal thickening, suggesting mild pulmonary edema. These findings suggest mild congestive heart failure. 2. Three-vessel coronary atherosclerosis. 3. Large hiatal hernia. 4. Evidence of chronic moderate sigmoid diverticulosis. No evidence of acute diverticulitis. No evidence of bowel  obstruction or acute bowel inflammation. 5. Aortic Atherosclerosis (ICD10-I70.0). Electronically Signed   By: Ilona Sorrel M.D.   On: 04/12/2020 15:19     Echo pending  TELEMETRY: Normal sinus rhythm left bundle branch block  ASSESSMENT AND PLAN:  Principal Problem:   Acute on chronic combined systolic and diastolic CHF (congestive heart failure) (HCC) Active Problems:   Controlled type 2 diabetes with neuropathy ([place])   History of cancer of vulva   Essential hypertension   Hyperlipidemia   CAD (coronary artery disease)   Chronic renal failure, stage 3a   Anemia   Class 1 obesity due to excess calories with body mass index (BMI) of 31.0 to 31.9 in adult Chronic anemia  Plan Agree with telemetry follow-up EKGs troponins Continue diuresis and supplemental oxygen Maintain diabetes management and control Echocardiogram may be helpful for assessment of left ventricular function wall motion Chronic anemia consider transfusion to H&H of 8 and 32 Follow-up renal function consider nephrology input Continue therapy for hyperlipidemia Agree with Protonix therapy for reflux symptoms Hypertension control with amlodipine Coreg consider adding ACE or ARB   Yolonda Kida, MD 04/13/2020 9:28 AM

## 2020-04-13 NOTE — Consult Note (Signed)
CARDIOLOGY CONSULT NOTE               Patient ID: Carly Cortez MRN: [id number] DOB/AGE: 01/13/1941 79 y.o.  Admit date: 04/12/2020 Referring Physician Dr. Bradd Canary Primary Physician Dr. Lovie Macadamia Primary Cardiologist Dr. 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   04/12/2020 at 0900   aspirin 81 MG tablet Take 81 mg by mouth every other day.    Past Week at Unknown time   carvedilol (COREG) 25 MG tablet Take 25 mg by mouth 2 (two) times daily.    04/12/2020 at 0900   clopidogrel (PLAVIX) 75 MG tablet Take 75 mg by mouth daily.   04/12/2020 at 0900   estradiol (ESTRACE) 0.1 MG/GM vaginal cream Place 2 g vaginally daily.   Past Week at Unknown time   fexofenadine (ALLEGRA) 180 MG tablet Take 180 mg by mouth daily.   04/12/2020 at 0900   isosorbide mononitrate (IMDUR) 120 MG 24 hr tablet Take 120 mg by mouth daily.   04/12/2020 at 0900   LANTUS SOLOSTAR 100 UNIT/ML Solostar Pen Inject 44 Units into the skin 2 (two) times daily.   04/12/2020 at 0900   pantoprazole (PROTONIX) 40 MG tablet Take 40 mg by mouth daily.   04/12/2020 at 7591   REPATHA SURECLICK 638 MG/ML  SOAJ Inject 140 mg into the skin every 14 (fourteen) days.    Past Month at Unknown time   furosemide (LASIX) 20 MG tablet Take 20 mg by mouth See admin instructions. Take 1 tablet (20mg ) by mouth daily and take 1 additional tablet (20mg ) by mouth daily as needed for fluid   prn at prn   meloxicam (MOBIC) 15 MG tablet Take 15 mg by mouth daily.   prn at prn   mometasone (ELOCON) 0.1 % cream Apply 1 application topically 2 (two) times daily.   prn at prn   nitroGLYCERIN (NITROSTAT) 0.4 MG SL tablet Place 0.4 mg under the tongue every 5 (five) minutes as needed for chest pain.   prn at prn   ondansetron (ZOFRAN) 8 MG tablet Take 8 mg by mouth every 8 (eight) hours as needed.   prn at prn   pregabalin (LYRICA) 50 MG capsule Take 50 mg by mouth 2 (two) times daily.       Social History   Socioeconomic History   Marital status: Married    Spouse name: Not on file   Number of children: Not on file   Years of education: Not on file   Highest education  level: Not on file  Occupational History   Not on file  Tobacco Use   Smoking status: Never Smoker   Smokeless tobacco: Never Used  Vaping Use   Vaping Use: Never used  Substance and Sexual Activity   Alcohol use: No   Drug use: No   Sexual activity: Not on file  Other Topics Concern   Not on file  Social History Narrative   Not on file   Social Determinants of Health   Financial Resource Strain:    Difficulty of Paying Living Expenses:   Food Insecurity:    Worried About Charity fundraiser in the Last Year:    Arboriculturist in the Last Year:   Transportation Needs:    Film/video editor (Medical):    Lack of Transportation (Non-Medical):   Physical Activity:    Days of Exercise per Week:    Minutes of Exercise per Session:   Stress:    Feeling of Stress :   Social Connections:    Frequency of Communication with Friends and Family:    Frequency of Social Gatherings with Friends and Family:    Attends Religious Services:     Active Member of Clubs or Organizations:    Attends Music therapist:    Marital Status:   Intimate Partner Violence:    Fear of Current or Ex-Partner:    Emotionally Abused:    Physically Abused:    Sexually Abused:     Family History  Problem Relation Age of Onset   Heart attack Mother    Diabetes Mother    Other Father        unknown medical history      Review of systems complete and found to be negative unless listed above      PHYSICAL EXAM  General: Well developed, well nourished, in no acute distress HEENT:  Normocephalic and atramatic Neck:  No JVD.  Lungs: Clear bilaterally to auscultation and percussion. Heart: HRRR . Normal S1 and S2 without gallops or murmurs.  Abdomen: Bowel sounds are positive, abdomen soft and non-tender  Msk:  Back normal, normal gait. Normal strength and tone for age. Extremities: No clubbing, cyanosis or edema.   Neuro: Alert and oriented X 3. Psych:  Good affect, responds appropriately  Labs:   Lab Results  Component Value Date   WBC 4.8 04/13/2020   HGB 7.2 (L) 04/13/2020   HCT 26.7 (L) 04/13/2020   MCV 64.6 (L) 04/13/2020   PLT 319 04/13/2020    Recent Labs  Lab 04/12/20 1420 04/12/20 1420 04/13/20 0635  NA 140   < > 141  K 3.8   < > 3.6  CL 105   < > 105  CO2 24   < > 26  BUN 14   < > 20  CREATININE 1.27*   < > 1.71*  CALCIUM 8.6*   < > 8.6*  PROT 7.1  --   --   BILITOT 1.0  --   --   ALKPHOS 77  --   --   ALT 6  --   --   AST 14*  --   --   GLUCOSE 109*   < > 98   < > = values in this interval not displayed.   Lab Results  Component Value Date   CKTOTAL 91 08/13/2014   CKMB 1.5 08/13/2014   TROPONINI <0.03 12/10/2018   No results found for: CHOL No results found for:  HDL No results found for: LDLCALC No results found for: TRIG No results found for: CHOLHDL No results found for: LDLDIRECT    Radiology: CT ABDOMEN PELVIS WO CONTRAST  Result Date: 04/12/2020 CLINICAL DATA:  Cough.  Sinus congestion and drainage for 2 weeks refractory to antibiotic therapy. Left chest discomfort. Anemia. Hypoxia. Acute abdominal pain. EXAM: CT CHEST, ABDOMEN AND PELVIS WITHOUT CONTRAST TECHNIQUE: Multidetector CT imaging of the chest, abdomen and pelvis was performed following the standard protocol without IV contrast. COMPARISON:  Chest radiograph from earlier today. 11/04/2019 CT abdomen/pelvis. FINDINGS: CT CHEST FINDINGS Cardiovascular: Borderline mild cardiomegaly. Trace pericardial effusion/thickening. Three-vessel coronary atherosclerosis. Atherosclerotic nonaneurysmal thoracic aorta. Top-normal caliber main pulmonary artery (3.1 cm diameter). Mediastinum/Nodes: No discrete thyroid nodules. Unremarkable esophagus. No pathologically enlarged axillary, mediastinal or hilar lymph nodes, noting limited sensitivity for the detection of hilar adenopathy on this noncontrast study. Lungs/Pleura: No pneumothorax. Small dependent bilateral pleural effusions. Mild dependent passive atelectasis in the lower lobes bilaterally. Compressive atelectasis in the medial left lower lobe from the large hiatal hernia. Mild diffuse interlobular septal thickening. Basilar right lower lobe 7 mm solid pulmonary nodule (series 4/image 90) is stable since 12/10/2018 CT and considered benign. No acute consolidative airspace disease, lung masses or new significant pulmonary nodules. Musculoskeletal: No aggressive appearing focal osseous lesions. Moderate lower thoracic spondylosis. CT ABDOMEN PELVIS FINDINGS Hepatobiliary: Normal liver with no liver mass. Cholecystectomy. No biliary ductal dilatation. Pancreas: Normal, with no mass or duct dilation. Spleen: Normal size. No mass. Adrenals/Urinary Tract: Normal adrenals. No hydronephrosis. No renal stones. No contour deforming renal masses. Normal bladder. Stomach/Bowel: Large hiatal hernia. Stomach is nondistended and otherwise normal. Postsurgical changes from subtotal right  hemicolectomy with ileocolic anastomosis in the right abdomen. No dilated or thick-walled small bowel loops. Moderate sigmoid diverticulosis. Chronic segmental mild mid sigmoid wall thickening is not substantially changed. No significant acute pericolonic fat stranding. No new large bowel wall thickening. Vascular/Lymphatic: Atherosclerotic nonaneurysmal abdominal aorta. No pathologically enlarged lymph nodes in the abdomen or pelvis. Reproductive: Status post hysterectomy, with no abnormal findings at the vaginal cuff. No adnexal mass. Other: No pneumoperitoneum, ascites or focal fluid collection. Musculoskeletal: No aggressive appearing focal osseous lesions. Mild lumbar spondylosis. IMPRESSION: 1. Borderline mild cardiomegaly. Trace pericardial effusion/thickening. Small dependent bilateral pleural effusions. Mild diffuse interlobular septal thickening, suggesting mild pulmonary edema. These findings suggest mild congestive heart failure. 2. Three-vessel coronary atherosclerosis. 3. Large hiatal hernia. 4. Evidence of chronic moderate sigmoid diverticulosis. No evidence of acute diverticulitis. No evidence of bowel obstruction or acute bowel inflammation. 5. Aortic Atherosclerosis (ICD10-I70.0). Electronically Signed   By: Ilona Sorrel M.D.   On: 04/12/2020 15:19   DG Chest 2 View  Result Date: 04/12/2020 CLINICAL DATA:  Chest pain. Sinus congestion and drainage started 2 weeks ago. Shortness of breath and chest pain. EXAM: CHEST - 2 VIEW COMPARISON:  11/04/2019 FINDINGS: Heart is enlarged and stable in configuration. Moderate hiatal hernia again noted. The mildly prominent interstitial markings are chronic. There is more focal opacity at the lung bases, raising the question of early infiltrates. Bilateral basilar pleural thickening or pleural fluid. The appearance favors infectious/inflammatory process over pulmonary edema. Exaggerated thoracic kyphosis and associated degenerative changes for stable.  IMPRESSION: 1. Cardiomegaly and hiatal hernia. 2. Early infiltrates and pleural changes at the bases. Electronically Signed   By: Nolon Nations M.D.   On: 04/12/2020 13:10   CT Chest Wo Contrast  Result Date: 04/12/2020 CLINICAL DATA:  Cough. Sinus congestion and drainage for 2  weeks refractory to antibiotic therapy. Left chest discomfort. Anemia. Hypoxia. Acute abdominal pain. EXAM: CT CHEST, ABDOMEN AND PELVIS WITHOUT CONTRAST TECHNIQUE: Multidetector CT imaging of the chest, abdomen and pelvis was performed following the standard protocol without IV contrast. COMPARISON:  Chest radiograph from earlier today. 11/04/2019 CT abdomen/pelvis. FINDINGS: CT CHEST FINDINGS Cardiovascular: Borderline mild cardiomegaly. Trace pericardial effusion/thickening. Three-vessel coronary atherosclerosis. Atherosclerotic nonaneurysmal thoracic aorta. Top-normal caliber main pulmonary artery (3.1 cm diameter). Mediastinum/Nodes: No discrete thyroid nodules. Unremarkable esophagus. No pathologically enlarged axillary, mediastinal or hilar lymph nodes, noting limited sensitivity for the detection of hilar adenopathy on this noncontrast study. Lungs/Pleura: No pneumothorax. Small dependent bilateral pleural effusions. Mild dependent passive atelectasis in the lower lobes bilaterally. Compressive atelectasis in the medial left lower lobe from the large hiatal hernia. Mild diffuse interlobular septal thickening. Basilar right lower lobe 7 mm solid pulmonary nodule (series 4/image 90) is stable since 12/10/2018 CT and considered benign. No acute consolidative airspace disease, lung masses or new significant pulmonary nodules. Musculoskeletal: No aggressive appearing focal osseous lesions. Moderate lower thoracic spondylosis. CT ABDOMEN PELVIS FINDINGS Hepatobiliary: Normal liver with no liver mass. Cholecystectomy. No biliary ductal dilatation. Pancreas: Normal, with no mass or duct dilation. Spleen: Normal size. No mass.  Adrenals/Urinary Tract: Normal adrenals. No hydronephrosis. No renal stones. No contour deforming renal masses. Normal bladder. Stomach/Bowel: Large hiatal hernia. Stomach is nondistended and otherwise normal. Postsurgical changes from subtotal right hemicolectomy with ileocolic anastomosis in the right abdomen. No dilated or thick-walled small bowel loops. Moderate sigmoid diverticulosis. Chronic segmental mild mid sigmoid wall thickening is not substantially changed. No significant acute pericolonic fat stranding. No new large bowel wall thickening. Vascular/Lymphatic: Atherosclerotic nonaneurysmal abdominal aorta. No pathologically enlarged lymph nodes in the abdomen or pelvis. Reproductive: Status post hysterectomy, with no abnormal findings at the vaginal cuff. No adnexal mass. Other: No pneumoperitoneum, ascites or focal fluid collection. Musculoskeletal: No aggressive appearing focal osseous lesions. Mild lumbar spondylosis. IMPRESSION: 1. Borderline mild cardiomegaly. Trace pericardial effusion/thickening. Small dependent bilateral pleural effusions. Mild diffuse interlobular septal thickening, suggesting mild pulmonary edema. These findings suggest mild congestive heart failure. 2. Three-vessel coronary atherosclerosis. 3. Large hiatal hernia. 4. Evidence of chronic moderate sigmoid diverticulosis. No evidence of acute diverticulitis. No evidence of bowel obstruction or acute bowel inflammation. 5. Aortic Atherosclerosis (ICD10-I70.0). Electronically Signed   By: Ilona Sorrel M.D.   On: 04/12/2020 15:19    EKG: Normal sinus rhythm nonspecific T2 changes  ASSESSMENT AND PLAN:  Respiratory failure Cardiac arrest Altered mental status COPD Smoking Obesity Acute on chronic renal insufficiency  . Plan Agree with supportive respiratory care Agree with broad-spectrum antibiotics for possible sepsis pneumonia Continue inhalers respiratory support Follow-up EKGs troponins Recommend anticoagulation  DVT doubt an acute cardiac event Recommend physical therapy and increase activity service status  Signed: Yolonda Kida MD 04/13/2020, 9:27 AM

## 2020-04-13 NOTE — Progress Notes (Signed)
Patient resting in bed at this time. Did not tolerate PT session well at all. SHOB and dizzy in chair. Moved her back to bed. Elevated legs, reclined HOB. Rechecked vitals. BP and 02 improved to 90/52 from 70/41. O2 now at 2L. Increased to 4 while moving her to bed. Daughter at bedside. Spoke with MD Wieting while at patient's bedside. He has ordered unit of blood to transfuse after Iron transfusion is complete. Will continue to monitor.

## 2020-04-13 NOTE — Progress Notes (Signed)
Nutrition Brief Note  RD consulted for nutritional assessment.   Spoke with patient who reports she ate a good breakfast and reports no issues with appetite or eating at this time.  States PTA she had a "fair" appetite. States it was worse following radiation treatments a few months ago.  Pt with no nutritional concerns at this time. Will place CHF diet information in discharge instructions.   Wt Readings from Last 15 Encounters:  04/13/20 85.3 kg  04/12/20 88.5 kg  11/04/19 81.6 kg  10/28/19 79.2 kg  08/23/19 85.7 kg  05/25/19 90.7 kg  12/25/18 95.3 kg  12/09/18 95.3 kg  12/18/16 92.6 kg  07/18/16 93 kg  06/18/16 93 kg  05/14/15 90.7 kg  02/11/15 92.5 kg    Body mass index is 30.34 kg/m. Patient meets criteria for obesity based on current BMI.   Current diet order is Heart healthy/CHO modified. Labs and medications reviewed.   No nutrition interventions warranted at this time. If nutrition issues arise, please consult RD.   Carly Bibles, MS, RD, LDN Inpatient Clinical Dietitian Contact information available via Amion

## 2020-04-13 NOTE — Progress Notes (Signed)
   04/13/20 0421  ReDS Vest / Clip  BMI (Calculated) 30.36  Station Marker D  Ruler Value 39  ReDS Value Range < 36  ReDS Actual Value 35

## 2020-04-14 DIAGNOSIS — Z794 Long term (current) use of insulin: Secondary | ICD-10-CM

## 2020-04-14 DIAGNOSIS — E11649 Type 2 diabetes mellitus with hypoglycemia without coma: Secondary | ICD-10-CM

## 2020-04-14 LAB — TYPE AND SCREEN
ABO/RH(D): A POS
Antibody Screen: NEGATIVE
Unit division: 0

## 2020-04-14 LAB — BASIC METABOLIC PANEL
Anion gap: 12 (ref 5–15)
BUN: 29 mg/dL — ABNORMAL HIGH (ref 8–23)
CO2: 25 mmol/L (ref 22–32)
Calcium: 8.4 mg/dL — ABNORMAL LOW (ref 8.9–10.3)
Chloride: 103 mmol/L (ref 98–111)
Creatinine, Ser: 1.68 mg/dL — ABNORMAL HIGH (ref 0.44–1.00)
GFR calc Af Amer: 33 mL/min — ABNORMAL LOW (ref >60)
GFR calc non Af Amer: 29 mL/min — ABNORMAL LOW (ref >60)
Glucose, Bld: 74 mg/dL (ref 70–99)
Potassium: 3.4 mmol/L — ABNORMAL LOW (ref 3.5–5.1)
Sodium: 140 mmol/L (ref 135–145)

## 2020-04-14 LAB — GLUCOSE, CAPILLARY
Glucose-Capillary: 49 mg/dL — ABNORMAL LOW (ref 70–99)
Glucose-Capillary: 98 mg/dL (ref 70–99)
Glucose-Capillary: 89 mg/dL (ref 70–99)
Glucose-Capillary: 127 mg/dL — ABNORMAL HIGH (ref 70–99)
Glucose-Capillary: 159 mg/dL — ABNORMAL HIGH (ref 70–99)

## 2020-04-14 LAB — BPAM RBC
Blood Product Expiration Date: 202108212359
ISSUE DATE / TIME: 202108091539
Unit Type and Rh: 600

## 2020-04-14 LAB — HEMOGLOBIN: Hemoglobin: 8 g/dL — ABNORMAL LOW (ref 12.0–15.0)

## 2020-04-14 LAB — OCCULT BLOOD X 1 CARD TO LAB, STOOL: Fecal Occult Bld: NEGATIVE

## 2020-04-14 MED ORDER — INSULIN GLARGINE 100 UNIT/ML ~~LOC~~ SOLN
15.0000 [IU] | Freq: Two times a day (BID) | SUBCUTANEOUS | Status: DC
  Administered 2020-04-14: 15 [IU] via SUBCUTANEOUS
  Filled 2020-04-14 (×2): qty 0.15

## 2020-04-14 MED ORDER — CARVEDILOL 6.25 MG PO TABS
6.2500 mg | ORAL_TABLET | Freq: Two times a day (BID) | ORAL | Status: DC
  Administered 2020-04-14 – 2020-04-15 (×3): 6.25 mg via ORAL
  Filled 2020-04-14 (×3): qty 1

## 2020-04-14 MED ORDER — INSULIN GLARGINE 100 UNIT/ML ~~LOC~~ SOLN
15.0000 [IU] | Freq: Every day | SUBCUTANEOUS | Status: DC
  Administered 2020-04-15: 15 [IU] via SUBCUTANEOUS
  Filled 2020-04-14 (×2): qty 0.15

## 2020-04-14 MED ORDER — GLUCOSE 40 % PO GEL: 1.0000 | Freq: Once | ORAL | Status: DC | PRN

## 2020-04-14 MED ORDER — POTASSIUM CHLORIDE CRYS ER 20 MEQ PO TBCR
40.0000 meq | EXTENDED_RELEASE_TABLET | Freq: Once | ORAL | Status: AC
  Administered 2020-04-14: 40 meq via ORAL
  Filled 2020-04-14: qty 2

## 2020-04-14 MED ORDER — IPRATROPIUM-ALBUTEROL 0.5-2.5 (3) MG/3ML IN SOLN
3.0000 mL | Freq: Four times a day (QID) | RESPIRATORY_TRACT | Status: DC
  Administered 2020-04-14 – 2020-04-15 (×4): 3 mL via RESPIRATORY_TRACT
  Filled 2020-04-14 (×5): qty 3

## 2020-04-14 MED ORDER — GLUCOSE 40 % PO GEL
ORAL | Status: AC
  Filled 2020-04-14: qty 1

## 2020-04-14 NOTE — Progress Notes (Signed)
Patient ID: Carly Cortez, female   DOB: January 01, 1941, 79 y.o.   MRN: [id number] [facility] PROGRESS NOTE  AERALYN BARNA ZJQ:[id number] DOB: 04-12-1941 DOA: 04/12/2020 PCP: Verita Lamb, NP  HPI/Subjective: Patient feeling better today.  Came in with shortness of breath.  She received a unit of blood yesterday and IV iron.  Had low blood pressure yesterday.  Patient had a low sugar this morning.  Objective: Vitals:   04/14/20 0802 04/14/20 1136  BP: 124/60 (!) 145/72  Pulse: 68 72  Resp: 18 18  Temp: 98.4 F (36.9 C) 98.2 F (36.8 C)  SpO2: 91% 97%    Intake/Output Summary (Last 24 hours) at 04/14/2020 1401 Last data filed at 04/14/2020 1055 Gross per 24 hour  Intake 1070.19 ml  Output 750 ml  Net 320.19 ml   Filed Weights   04/12/20 1416 04/13/20 0421 04/14/20 0523  Weight: 88.5 kg 85.3 kg 86.2 kg    ROS: Review of Systems  Respiratory: Positive for shortness of breath.   Cardiovascular: Negative for chest pain.  Gastrointestinal: Negative for abdominal pain, nausea and vomiting.  Musculoskeletal: Negative for joint pain.   Exam: Physical Exam HENT:     Nose: No mucosal edema.     Mouth/Throat:     Pharynx: No oropharyngeal exudate.  Eyes:     General: Lids are normal.     Conjunctiva/sclera: Conjunctivae normal.     Pupils: Pupils are equal, round, and reactive to light.  Cardiovascular:     Rate and Rhythm: Normal rate and regular rhythm.     Heart sounds: Normal heart sounds, S1 normal and S2 normal.  Pulmonary:     Breath sounds: Examination of the right-lower field reveals decreased breath sounds. Examination of the left-lower field reveals decreased breath sounds. Decreased breath sounds present. No wheezing, rhonchi or rales.     Comments: Breath sounds coarse. Abdominal:     Palpations: Abdomen is soft.     Tenderness: There is no abdominal tenderness.  Musculoskeletal:     Right lower leg: No swelling.     Left lower leg: No swelling.  Skin:     General: Skin is warm.     Findings: No rash.  Neurological:     Mental Status: She is alert and oriented to person, place, and time.       Data Reviewed: Basic Metabolic Panel: Recent Labs  Lab 04/12/20 1237 04/12/20 1420 04/13/20 0635 04/14/20 0534  NA 139 140 141 140  K 4.1 3.8 3.6 3.4*  CL 108 105 105 103  CO2 23 24 26 25   GLUCOSE 100* 109* 98 74  BUN 14 14 20  29*  CREATININE 1.31* 1.27* 1.71* 1.68*  CALCIUM 8.6* 8.6* 8.6* 8.4*  MG  --  2.2  --   --    Liver Function Tests: Recent Labs  Lab 04/12/20 1237 04/12/20 1420  AST 12* 14*  ALT 6 6  ALKPHOS 79 77  BILITOT 0.9 1.0  PROT 7.2 7.1  ALBUMIN 3.7 3.6   Recent Labs  Lab 04/12/20 1420  LIPASE 22   CBC: Recent Labs  Lab 04/12/20 1237 04/12/20 1420 04/13/20 0635 04/14/20 0534  WBC 5.6 5.9 4.8  --   NEUTROABS 4.2 4.2 3.3  --   HGB 7.7* 7.3* 7.2* 8.0*  HCT 28.7* 27.4* 26.7*  --   MCV 64.6* 64.8* 64.6*  --   PLT 350 350 319  --    BNP (last 3 results) Recent Labs  04/12/20 1420  BNP 447.6*    CBG: Recent Labs  Lab 04/13/20 1641 04/13/20 2105 04/14/20 0803 04/14/20 0832 04/14/20 1135  GLUCAP 128* 140* 49* 98 89    Recent Results (from the past 240 hour(s))  Blood culture (routine x 2)     Status: None (Preliminary result)   Collection Time: 04/12/20  4:18 PM   Specimen: BLOOD  Result Value Ref Range Status   Specimen Description BLOOD LEFT ANTECUBITAL  Final   Special Requests   Final    BOTTLES DRAWN AEROBIC AND ANAEROBIC Blood Culture adequate volume   Culture   Final    NO GROWTH 2 DAYS Performed at [facility], [address] [place], [place] [address]    Report Status PENDING  Incomplete  Blood culture (routine x 2)     Status: None (Preliminary result)   Collection Time: 04/12/20  4:18 PM   Specimen: BLOOD  Result Value Ref Range Status   Specimen Description BLOOD RIGHT ANTECUBITAL  Final   Special Requests   Final    BOTTLES DRAWN AEROBIC AND ANAEROBIC  Blood Culture adequate volume   Culture   Final    NO GROWTH 2 DAYS Performed at [facility], [address] [place], [place] [address]    Report Status PENDING  Incomplete  SARS Coronavirus 2 by RT PCR (hospital order, performed in [facility] hospital lab) Nasopharyngeal Nasopharyngeal Swab     Status: None   Collection Time: 04/12/20  4:18 PM   Specimen: Nasopharyngeal Swab  Result Value Ref Range Status   SARS Coronavirus 2 NEGATIVE NEGATIVE Final    Comment: (NOTE) SARS-CoV-2 target nucleic acids are NOT DETECTED.  The SARS-CoV-2 RNA is generally detectable in upper and lower respiratory specimens during the acute phase of infection. The lowest concentration of SARS-CoV-2 viral copies this assay can detect is 250 copies / mL. A negative result does not preclude SARS-CoV-2 infection and should not be used as the sole basis for treatment or other patient management decisions.  A negative result may occur with improper specimen collection / handling, submission of specimen other than nasopharyngeal swab, presence of viral mutation(s) within the areas targeted by this assay, and inadequate number of viral copies (<250 copies / mL). A negative result must be combined with clinical observations, patient history, and epidemiological information.  Fact Sheet for Patients:   StrictlyIdeas.no  Fact Sheet for Healthcare Providers: BankingDealers.co.za  This test is not yet approved or  cleared by the [place] FDA and has been authorized for detection and/or diagnosis of SARS-CoV-2 by FDA under an Emergency Use Authorization (EUA).  This EUA will remain in effect (meaning this test can be used) for the duration of the COVID-19 declaration under Section 564(b)(1) of the Act, 21 U.S.C. section 360bbb-3(b)(1), unless the authorization is terminated or revoked sooner.  Performed at [facility], [place], [place] [address]   C Difficile Quick Screen w PCR reflex     Status: None   Collection Time: 04/13/20  9:31 AM   Specimen: STOOL  Result Value Ref Range Status   C Diff antigen NEGATIVE NEGATIVE Final   C Diff toxin NEGATIVE NEGATIVE Final   C Diff interpretation No C. difficile detected.  Final    Comment: Performed at [facility], [address] [place],  [address]     Studies: CT ABDOMEN PELVIS WO CONTRAST  Result Date: 04/12/2020 CLINICAL DATA:  Cough. Sinus congestion and  drainage for 2 weeks refractory to antibiotic therapy. Left chest discomfort. Anemia. Hypoxia. Acute abdominal pain. EXAM: CT CHEST, ABDOMEN AND PELVIS WITHOUT CONTRAST TECHNIQUE: Multidetector CT imaging of the chest, abdomen and pelvis was performed following the standard protocol without IV contrast. COMPARISON:  Chest radiograph from earlier today. 11/04/2019 CT abdomen/pelvis. FINDINGS: CT CHEST FINDINGS Cardiovascular: Borderline mild cardiomegaly. Trace pericardial effusion/thickening. Three-vessel coronary atherosclerosis. Atherosclerotic nonaneurysmal thoracic aorta. Top-normal caliber main pulmonary artery (3.1 cm diameter). Mediastinum/Nodes: No discrete thyroid nodules. Unremarkable esophagus. No pathologically enlarged axillary, mediastinal or hilar lymph nodes, noting limited sensitivity for the detection of hilar adenopathy on this noncontrast study. Lungs/Pleura: No pneumothorax. Small dependent bilateral pleural effusions. Mild dependent passive atelectasis in the lower lobes bilaterally. Compressive atelectasis in the medial left lower lobe from the large hiatal hernia. Mild diffuse interlobular septal thickening. Basilar right lower lobe 7 mm solid pulmonary nodule (series 4/image 90) is stable since 12/10/2018 CT and considered benign. No acute consolidative airspace disease, lung masses or new significant pulmonary nodules. Musculoskeletal: No aggressive appearing focal osseous  lesions. Moderate lower thoracic spondylosis. CT ABDOMEN PELVIS FINDINGS Hepatobiliary: Normal liver with no liver mass. Cholecystectomy. No biliary ductal dilatation. Pancreas: Normal, with no mass or duct dilation. Spleen: Normal size. No mass. Adrenals/Urinary Tract: Normal adrenals. No hydronephrosis. No renal stones. No contour deforming renal masses. Normal bladder. Stomach/Bowel: Large hiatal hernia. Stomach is nondistended and otherwise normal. Postsurgical changes from subtotal right hemicolectomy with ileocolic anastomosis in the right abdomen. No dilated or thick-walled small bowel loops. Moderate sigmoid diverticulosis. Chronic segmental mild mid sigmoid wall thickening is not substantially changed. No significant acute pericolonic fat stranding. No new large bowel wall thickening. Vascular/Lymphatic: Atherosclerotic nonaneurysmal abdominal aorta. No pathologically enlarged lymph nodes in the abdomen or pelvis. Reproductive: Status post hysterectomy, with no abnormal findings at the vaginal cuff. No adnexal mass. Other: No pneumoperitoneum, ascites or focal fluid collection. Musculoskeletal: No aggressive appearing focal osseous lesions. Mild lumbar spondylosis. IMPRESSION: 1. Borderline mild cardiomegaly. Trace pericardial effusion/thickening. Small dependent bilateral pleural effusions. Mild diffuse interlobular septal thickening, suggesting mild pulmonary edema. These findings suggest mild congestive heart failure. 2. Three-vessel coronary atherosclerosis. 3. Large hiatal hernia. 4. Evidence of chronic moderate sigmoid diverticulosis. No evidence of acute diverticulitis. No evidence of bowel obstruction or acute bowel inflammation. 5. Aortic Atherosclerosis (ICD10-I70.0). Electronically Signed   By: Ilona Sorrel M.D.   On: 04/12/2020 15:19   CT Chest Wo Contrast  Result Date: 04/12/2020 CLINICAL DATA:  Cough. Sinus congestion and drainage for 2 weeks refractory to antibiotic therapy. Left chest  discomfort. Anemia. Hypoxia. Acute abdominal pain. EXAM: CT CHEST, ABDOMEN AND PELVIS WITHOUT CONTRAST TECHNIQUE: Multidetector CT imaging of the chest, abdomen and pelvis was performed following the standard protocol without IV contrast. COMPARISON:  Chest radiograph from earlier today. 11/04/2019 CT abdomen/pelvis. FINDINGS: CT CHEST FINDINGS Cardiovascular: Borderline mild cardiomegaly. Trace pericardial effusion/thickening. Three-vessel coronary atherosclerosis. Atherosclerotic nonaneurysmal thoracic aorta. Top-normal caliber main pulmonary artery (3.1 cm diameter). Mediastinum/Nodes: No discrete thyroid nodules. Unremarkable esophagus. No pathologically enlarged axillary, mediastinal or hilar lymph nodes, noting limited sensitivity for the detection of hilar adenopathy on this noncontrast study. Lungs/Pleura: No pneumothorax. Small dependent bilateral pleural effusions. Mild dependent passive atelectasis in the lower lobes bilaterally. Compressive atelectasis in the medial left lower lobe from the large hiatal hernia. Mild diffuse interlobular septal thickening. Basilar right lower lobe 7 mm solid pulmonary nodule (series 4/image 90) is stable since 12/10/2018 CT and considered benign. No acute consolidative airspace disease, lung masses  or new significant pulmonary nodules. Musculoskeletal: No aggressive appearing focal osseous lesions. Moderate lower thoracic spondylosis. CT ABDOMEN PELVIS FINDINGS Hepatobiliary: Normal liver with no liver mass. Cholecystectomy. No biliary ductal dilatation. Pancreas: Normal, with no mass or duct dilation. Spleen: Normal size. No mass. Adrenals/Urinary Tract: Normal adrenals. No hydronephrosis. No renal stones. No contour deforming renal masses. Normal bladder. Stomach/Bowel: Large hiatal hernia. Stomach is nondistended and otherwise normal. Postsurgical changes from subtotal right hemicolectomy with ileocolic anastomosis in the right abdomen. No dilated or thick-walled small  bowel loops. Moderate sigmoid diverticulosis. Chronic segmental mild mid sigmoid wall thickening is not substantially changed. No significant acute pericolonic fat stranding. No new large bowel wall thickening. Vascular/Lymphatic: Atherosclerotic nonaneurysmal abdominal aorta. No pathologically enlarged lymph nodes in the abdomen or pelvis. Reproductive: Status post hysterectomy, with no abnormal findings at the vaginal cuff. No adnexal mass. Other: No pneumoperitoneum, ascites or focal fluid collection. Musculoskeletal: No aggressive appearing focal osseous lesions. Mild lumbar spondylosis. IMPRESSION: 1. Borderline mild cardiomegaly. Trace pericardial effusion/thickening. Small dependent bilateral pleural effusions. Mild diffuse interlobular septal thickening, suggesting mild pulmonary edema. These findings suggest mild congestive heart failure. 2. Three-vessel coronary atherosclerosis. 3. Large hiatal hernia. 4. Evidence of chronic moderate sigmoid diverticulosis. No evidence of acute diverticulitis. No evidence of bowel obstruction or acute bowel inflammation. 5. Aortic Atherosclerosis (ICD10-I70.0). Electronically Signed   By: Ilona Sorrel M.D.   On: 04/12/2020 15:19   ECHOCARDIOGRAM COMPLETE  Result Date: 04/13/2020    ECHOCARDIOGRAM REPORT   Patient Name:   TALAYLA DOYEL Date of Exam: 04/13/2020 Medical Rec #:  [id number]     Height:       66.0 in Accession #:    [id number]    Weight:       188.0 lb Date of Birth:  November 01, 1940     BSA:          1.948 m Patient Age:    58 years      BP:           141/62 mmHg Patient Gender: F             HR:           66 bpm. Exam Location:  [facility] Procedure: 2D Echo, Cardiac Doppler and Color Doppler Indications:     CHF- acute systolic 093.81  History:         Patient has prior history of Echocardiogram examinations, most                  recent 12/18/2016. Risk Factors:Hypertension and Diabetes.  Sonographer:     Sherrie Sport RDCS (AE) Referring Phys:  [id number] Karmen Bongo  Diagnosing Phys: Serafina Royals MD  Sonographer Comments: Technically challenging study due to limited acoustic windows. IMPRESSIONS  1. Left ventricular ejection fraction, by estimation, is 45 to 50%. The left ventricle has mildly decreased function. The left ventricle demonstrates regional wall motion abnormalities (see scoring diagram/findings for description). Left ventricular diastolic parameters were normal.  2. Right ventricular systolic function is normal. The right ventricular size is normal. There is normal pulmonary artery systolic pressure.  3. The mitral valve is normal in structure. Mild mitral valve regurgitation.  4. The aortic valve is normal in structure. Aortic valve regurgitation is not visualized. FINDINGS  Left Ventricle: Left ventricular ejection fraction, by estimation, is 45 to 50%. The left ventricle has mildly decreased function. The left ventricle demonstrates regional wall motion abnormalities. Moderate hypokinesis of the left ventricular, entire septal wall. The  left ventricular internal cavity size was normal in size. There is no left ventricular hypertrophy. Left ventricular diastolic parameters were normal. Right Ventricle: The right ventricular size is normal. No increase in right ventricular wall thickness. Right ventricular systolic function is normal. There is normal pulmonary artery systolic pressure. The tricuspid regurgitant velocity is 2.20 m/s, and  with an assumed right atrial pressure of 10 mmHg, the estimated right ventricular systolic pressure is 30.8 mmHg. Left Atrium: Left atrial size was normal in size. Right Atrium: Right atrial size was normal in size. Pericardium: There is no evidence of pericardial effusion. Mitral Valve: The mitral valve is normal in structure. Mild mitral valve regurgitation. Tricuspid Valve: The tricuspid valve is normal in structure. Tricuspid valve regurgitation is mild. Aortic Valve: The aortic valve is normal in structure. Aortic valve  regurgitation is not visualized. Aortic valve mean gradient measures 3.0 mmHg. Aortic valve peak gradient measures 5.3 mmHg. Aortic valve area, by VTI measures 2.70 cm. Pulmonic Valve: The pulmonic valve was normal in structure. Pulmonic valve regurgitation is not visualized. Aorta: The aortic root and ascending aorta are structurally normal, with no evidence of dilitation. IAS/Shunts: No atrial level shunt detected by color flow Doppler.  LEFT VENTRICLE PLAX 2D LVIDd:         4.40 cm  Diastology LVIDs:         2.99 cm  LV e' lateral:   5.44 cm/s LV PW:         1.56 cm  LV E/e' lateral: 15.7 LV IVS:        1.38 cm  LV e' medial:    3.81 cm/s LVOT diam:     2.20 cm  LV E/e' medial:  22.4 LV SV:         61 LV SV Index:   31 LVOT Area:     3.80 cm  RIGHT VENTRICLE RV Basal diam:  3.02 cm RV S prime:     13.70 cm/s TAPSE (M-mode): 2.8 cm LEFT ATRIUM             Index       RIGHT ATRIUM           Index LA diam:        4.90 cm 2.52 cm/m  RA Area:     14.40 cm LA Vol (A2C):   95.8 ml 49.18 ml/m RA Volume:   32.70 ml  16.79 ml/m LA Vol (A4C):   92.6 ml 47.54 ml/m LA Biplane Vol: 95.6 ml 49.08 ml/m  AORTIC VALVE                   PULMONIC VALVE AV Area (Vmax):    1.73 cm    PV Vmax:        0.74 m/s AV Area (Vmean):   1.77 cm    PV Peak grad:   2.2 mmHg AV Area (VTI):     2.70 cm    RVOT Peak grad: 3 mmHg AV Vmax:           115.50 cm/s AV Vmean:          80.700 cm/s AV VTI:            0.226 m AV Peak Grad:      5.3 mmHg AV Mean Grad:      3.0 mmHg LVOT Vmax:         52.50 cm/s LVOT Vmean:        37.600 cm/s LVOT VTI:  0.161 m LVOT/AV VTI ratio: 0.71  AORTA Ao Root diam: 3.00 cm MITRAL VALVE                TRICUSPID VALVE MV Area (PHT): 3.20 cm     TR Peak grad:   19.4 mmHg MV Decel Time: 237 msec     TR Vmax:        220.00 cm/s MV E velocity: 85.40 cm/s MV A velocity: 101.00 cm/s  SHUNTS MV E/A ratio:  0.85         Systemic VTI:  0.16 m                             Systemic Diam: 2.20 cm Serafina Royals MD  Electronically signed by Serafina Royals MD Signature Date/Time: 04/13/2020/3:43:14 PM    Final     Scheduled Meds: . aspirin EC  81 mg Oral QODAY  . azithromycin  250 mg Oral q1800  . carvedilol  6.25 mg Oral BID  . docusate sodium  100 mg Oral BID  . enoxaparin (LOVENOX) injection  30 mg Subcutaneous Q24H  . insulin aspart  0-15 Units Subcutaneous TID WC  . insulin aspart  0-5 Units Subcutaneous QHS  . [START ON 04/15/2020] insulin glargine  15 Units Subcutaneous Daily  . ipratropium-albuterol  3 mL Nebulization Q6H  . isosorbide mononitrate  120 mg Oral Daily  . loratadine  10 mg Oral Daily  . mouth rinse  15 mL Mouth Rinse BID  . pantoprazole  40 mg Oral Daily  . pregabalin  50 mg Oral BID  . sodium chloride flush  3 mL Intravenous Q12H   Continuous Infusions: . sodium chloride Stopped (04/13/20 1812)  . cefTRIAXone (ROCEPHIN)  IV Stopped (04/13/20 1806)    Assessment/Plan:  1. Acute on chronic diastolic congestive heart failure.  Hold Lasix today with acute kidney injury from yesterday. 2. Iron deficiency anemia with low hemoglobin yesterday.  Responded to packed red blood cells and IV iron.  Hemoglobin up to 8.0.  Patient states that she does not take Plavix so this medication was discontinued. 3. Hypotension yesterday.  Improved after packed red blood cells. 4. Pneumonia seen on chest x-ray.  Rocephin and Zithromax. 5. Acute kidney injury on chronic kidney disease stage IIIb secondary to overdiuresis.  Creatinine improved a little bit today but still impaired. 6. Acute hypoxic respiratory failure.  Improved from 4 L down to 2 L.  Will see if we can taper further to get her off the oxygen. 7. Type 2 diabetes mellitus with hypoglycemia this morning.  Patient also has neuropathy.  Cut back glargine insulin to once daily dosing and decrease the dose quite a bit.  Continue pregabalin 8. History of vulvar cancer. 9. Continued physical therapy evaluation   Code Status:     Code  Status Orders  (From admission, onward)         Start     Ordered   04/12/20 1704  Do not attempt resuscitation (DNR)  Continuous       Question Answer Comment  In the event of cardiac or respiratory ARREST Do not call a "code blue"   In the event of cardiac or respiratory ARREST Do not perform Intubation, CPR, defibrillation or ACLS   In the event of cardiac or respiratory ARREST Use medication by any route, position, wound care, and other measures to relive pain and suffering. May use oxygen, suction and manual treatment of  airway obstruction as needed for comfort.      04/12/20 1704        Code Status History    Date Active Date Inactive Code Status Order ID Comments User Context   10/28/2019 2027 10/30/2019 1955 Full Code [id number]  Athena Masse, MD ED   12/17/2016 1533 12/18/2016 1837 Full Code [id number]  Bettey Costa, MD Inpatient   Advance Care Planning Activity    Advance Directive Documentation     Most Recent Value  Type of Advance Directive Healthcare Power of [place], Living will  Pre-existing out of facility DNR order (yellow form or pink MOST form) --  "MOST" Form in Place? --     Family Communication: Spoke with daughter on the phone Disposition Plan: Status is: Inpatient  Dispo: The patient is from: Home              Anticipated d/c is to: Home              Anticipated d/c date is: Likely will need a few more days here secondary to balancing heart failure and acute kidney injury and hypotension yesterday.  Would also like to get the patient off oxygen              Patient currently being treated for multiple issues heart failure, pneumonia hypotension anemia and acute kidney injury  Consultants:  Cardiology  Antibiotics:  Rocephin  Zithromax  Time spent: 27 minutes  [place]

## 2020-04-14 NOTE — TOC Initial Note (Signed)
Transition of Care [facility]) - Initial/Assessment Note    Patient Details  Name: Carly Cortez MRN: [id number] Date of Birth: 1940-12-28  Transition of Care [facility]) CM/SW Contact:    Eileen Stanford, LCSW Phone Number: 04/14/2020, 3:40 PM  Clinical Narrative:    Pt is alert and oriented. Pt is agreeable to therapies through [facility]. Pt lives with spouse and states he will be with her 24 hrs every day. Pt states she has a walker at home. Pt is agreeable to use [facility] as she has used them in the past.   Pt has a scale at home and is aware to weigh herself daily and notified MD of any weight gain. Pt states her cardiologist is Priscella Mann in [place].   Pt states her spouse transports her to all her appointments. Pt still sees Dr Guy Sandifer. Pt states she gets all of her meds at [facility] in [place]. Pt will d/c with HH. No additional needs at this time.            Expected Discharge Plan: [place] Barriers to Discharge: Continued Medical Work up   Patient Goals and CMS Choice Patient states their goals for this hospitalization and ongoing recovery are:: to get stronger   Choice offered to / list presented to : Patient  Expected Discharge Plan and Services Expected Discharge Plan: [place] In-house Referral: Clinical Social Work   Post Acute Care Choice: [place] arrangements for the past 2 months: [place]: PT [place]: [place] ([place]) Date [place]: 04/14/20 Time [place]: 1539 Representative spoke with at [place]  Prior Living Arrangements/Services Living arrangements for the past 2 months: [place] with:: Spouse Patient language and need for interpreter reviewed:: Yes Do you feel safe going back to the place where you live?: Yes      Need for Family Participation in Patient Care: Yes (Comment) Care giver  support system in place?: Yes (comment)   Criminal Activity/Legal Involvement Pertinent to Current Situation/Hospitalization: No - Comment as needed  Activities of Daily Living Home Assistive Devices/Equipment: CBG Meter, Eyeglasses, Blood pressure cuff, Scales ADL Screening (condition at time of admission) Patient's cognitive ability adequate to safely complete daily activities?: Yes Is the patient deaf or have difficulty hearing?: Yes Does the patient have difficulty seeing, even when wearing glasses/contacts?: No Does the patient have difficulty concentrating, remembering, or making decisions?: No Patient able to express need for assistance with ADLs?: Yes Does the patient have difficulty dressing or bathing?: No Independently performs ADLs?: Yes (appropriate for developmental age) Does the patient have difficulty walking or climbing stairs?: Yes Weakness of Legs: Both Weakness of Arms/Hands: Right  Permission Sought/Granted Permission sought to share information with : Family Supports    Share Information with NAME: Herbie Baltimore  Permission granted to share info w AGENCY: [place] granted to share info w Relationship: spouse     Emotional Assessment Appearance:: Appears stated age Attitude/Demeanor/Rapport: Engaged Affect (typically observed): Accepting, Appropriate Orientation: : Oriented to Situation, Oriented to  Time, Oriented to Place, Oriented to Self Alcohol / Substance Use: Not Applicable Psych Involvement: No (comment)  Admission diagnosis:  Acute respiratory failure with hypoxia (HCC) [J96.01] Low hemoglobin [D64.9] Acute on chronic  combined systolic and diastolic CHF (congestive heart failure) (HCC) [I50.43] Pneumonia due to infectious organism, unspecified laterality, unspecified part of lung [J18.9] Acute on chronic congestive heart failure, unspecified heart failure type [facility]) [I50.9] Patient Active Problem List   Diagnosis Date Noted  .  Lobar pneumonia ([place])   . Hypotension   . Acute kidney injury superimposed on CKD ([place])   . Acute respiratory failure with hypoxia ([place])   . Acute on chronic diastolic CHF (congestive heart failure) ([place]) 04/12/2020  . Anemia 04/12/2020  . Class 1 obesity due to excess calories with body mass index (BMI) of 31.0 to 31.9 in adult 04/12/2020  . Diarrhea   . Chronic renal failure, stage 3a   . UTI (urinary tract infection) 10/28/2019  . Acute gastroenteritis 10/28/2019  . 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 History of partial colectomy 10/28/2019  . History of colon cancer 10/28/2019  . History of cancer of vulva 10/28/2019  . Essential hypertension 10/28/2019  . Hyperlipidemia 10/28/2019  . Sepsis ([place]) 10/28/2019  . Acute metabolic encephalopathy 17/35/6701  . CAD (coronary artery disease) 10/28/2019  . Hyperglycemia due to type 2 diabetes mellitus ([place]) 10/28/2019  . Syncope 12/17/2016   PCP:  Verita Lamb, NP Pharmacy:   [facility] [address] [place] [address] Phone: [phone] Fax: [phone]     Social Determinants of Health (SDOH) Interventions    Readmission Risk Interventions No flowsheet data found.

## 2020-04-14 NOTE — Plan of Care (Signed)
  Problem: Clinical Measurements: Goal: Diagnostic test results will improve Outcome: Progressing Goal: Respiratory complications will improve Outcome: Progressing Note: Pt. Weaned down to [facility]   Problem: Safety: Goal: Ability to remain free from injury will improve Outcome: Progressing   Problem: Skin Integrity: Goal: Risk for impaired skin integrity will decrease Outcome: Progressing

## 2020-04-14 NOTE — Progress Notes (Signed)
[facility] Cardiology    SUBJECTIVE: Patient states to be doing much better reduced shortness of breath no weakness do not think she needs a supplemental oxygen feels well enough to go home   Vitals:   04/13/20 2115 04/14/20 0523 04/14/20 0802 04/14/20 1136  BP: (!) 102/55 (!) 120/57 124/60 (!) 145/72  Pulse: 72 68 68 72  Resp:   18 18  Temp:  98.7 F (37.1 C) 98.4 F (36.9 C) 98.2 F (36.8 C)  TempSrc:  Oral Oral   SpO2:  95% 91% 97%  Weight:  86.2 kg    Height:         Intake/Output Summary (Last 24 hours) at 04/14/2020 1330 Last data filed at 04/14/2020 1055 Gross per 24 hour  Intake 1070.19 ml  Output 750 ml  Net 320.19 ml      PHYSICAL EXAM  General: Well developed, well nourished, in no acute distress HEENT:  Normocephalic and atramatic Neck:  No JVD.  Lungs: Clear bilaterally to auscultation and percussion. Heart: HRRR . Normal S1 and S2 without gallops or murmurs.  Abdomen: Bowel sounds are positive, abdomen soft and non-tender  Msk:  Back normal, normal gait. Normal strength and tone for age. Extremities: No clubbing, cyanosis or edema.   Neuro: Alert and oriented X 3. Psych:  Good affect, responds appropriately   LABS: Basic Metabolic Panel: Recent Labs    04/12/20 1420 04/12/20 1420 04/13/20 0635 04/14/20 0534  NA 140   < > 141 140  K 3.8   < > 3.6 3.4*  CL 105   < > 105 103  CO2 24   < > 26 25  GLUCOSE 109*   < > 98 74  BUN 14   < > 20 29*  CREATININE 1.27*   < > 1.71* 1.68*  CALCIUM 8.6*   < > 8.6* 8.4*  MG 2.2  --   --   --    < > = values in this interval not displayed.   Liver Function Tests: Recent Labs    04/12/20 1237 04/12/20 1420  AST 12* 14*  ALT 6 6  ALKPHOS 79 77  BILITOT 0.9 1.0  PROT 7.2 7.1  ALBUMIN 3.7 3.6   Recent Labs    04/12/20 1420  LIPASE 22   CBC: Recent Labs    04/12/20 1420 04/12/20 1420 04/13/20 0635 04/14/20 0534  WBC 5.9  --  4.8  --   NEUTROABS 4.2  --  3.3  --   HGB 7.3*   < > 7.2* 8.0*  HCT  27.4*  --  26.7*  --   MCV 64.8*  --  64.6*  --   PLT 350  --  319  --    < > = values in this interval not displayed.   Cardiac Enzymes: No results for input(s): CKTOTAL, CKMB, CKMBINDEX, TROPONINI in the last 72 hours. BNP: Invalid input(s): POCBNP D-Dimer: No results for input(s): DDIMER in the last 72 hours. Hemoglobin A1C: No results for input(s): HGBA1C in the last 72 hours. Fasting Lipid Panel: No results for input(s): CHOL, HDL, LDLCALC, TRIG, CHOLHDL, LDLDIRECT in the last 72 hours. Thyroid Function Tests: No results for input(s): TSH, T4TOTAL, T3FREE, THYROIDAB in the last 72 hours.  Invalid input(s): FREET3 Anemia Panel: Recent Labs    04/12/20 1858  VITAMINB12 121*  FOLATE 9.4  FERRITIN 4*  TIBC 489*  IRON 17*  RETICCTPCT 2.5    CT ABDOMEN PELVIS WO CONTRAST  Result Date:  04/12/2020 CLINICAL DATA:  Cough. Sinus congestion and drainage for 2 weeks refractory to antibiotic therapy. Left chest discomfort. Anemia. Hypoxia. Acute abdominal pain. EXAM: CT CHEST, ABDOMEN AND PELVIS WITHOUT CONTRAST TECHNIQUE: Multidetector CT imaging of the chest, abdomen and pelvis was performed following the standard protocol without IV contrast. COMPARISON:  Chest radiograph from earlier today. 11/04/2019 CT abdomen/pelvis. FINDINGS: CT CHEST FINDINGS Cardiovascular: Borderline mild cardiomegaly. Trace pericardial effusion/thickening. Three-vessel coronary atherosclerosis. Atherosclerotic nonaneurysmal thoracic aorta. Top-normal caliber main pulmonary artery (3.1 cm diameter). Mediastinum/Nodes: No discrete thyroid nodules. Unremarkable esophagus. No pathologically enlarged axillary, mediastinal or hilar lymph nodes, noting limited sensitivity for the detection of hilar adenopathy on this noncontrast study. Lungs/Pleura: No pneumothorax. Small dependent bilateral pleural effusions. Mild dependent passive atelectasis in the lower lobes bilaterally. Compressive atelectasis in the medial left  lower lobe from the large hiatal hernia. Mild diffuse interlobular septal thickening. Basilar right lower lobe 7 mm solid pulmonary nodule (series 4/image 90) is stable since 12/10/2018 CT and considered benign. No acute consolidative airspace disease, lung masses or new significant pulmonary nodules. Musculoskeletal: No aggressive appearing focal osseous lesions. Moderate lower thoracic spondylosis. CT ABDOMEN PELVIS FINDINGS Hepatobiliary: Normal liver with no liver mass. Cholecystectomy. No biliary ductal dilatation. Pancreas: Normal, with no mass or duct dilation. Spleen: Normal size. No mass. Adrenals/Urinary Tract: Normal adrenals. No hydronephrosis. No renal stones. No contour deforming renal masses. Normal bladder. Stomach/Bowel: Large hiatal hernia. Stomach is nondistended and otherwise normal. Postsurgical changes from subtotal right hemicolectomy with ileocolic anastomosis in the right abdomen. No dilated or thick-walled small bowel loops. Moderate sigmoid diverticulosis. Chronic segmental mild mid sigmoid wall thickening is not substantially changed. No significant acute pericolonic fat stranding. No new large bowel wall thickening. Vascular/Lymphatic: Atherosclerotic nonaneurysmal abdominal aorta. No pathologically enlarged lymph nodes in the abdomen or pelvis. Reproductive: Status post hysterectomy, with no abnormal findings at the vaginal cuff. No adnexal mass. Other: No pneumoperitoneum, ascites or focal fluid collection. Musculoskeletal: No aggressive appearing focal osseous lesions. Mild lumbar spondylosis. IMPRESSION: 1. Borderline mild cardiomegaly. Trace pericardial effusion/thickening. Small dependent bilateral pleural effusions. Mild diffuse interlobular septal thickening, suggesting mild pulmonary edema. These findings suggest mild congestive heart failure. 2. Three-vessel coronary atherosclerosis. 3. Large hiatal hernia. 4. Evidence of chronic moderate sigmoid diverticulosis. No evidence of  acute diverticulitis. No evidence of bowel obstruction or acute bowel inflammation. 5. Aortic Atherosclerosis (ICD10-I70.0). Electronically Signed   By: Ilona Sorrel M.D.   On: 04/12/2020 15:19   CT Chest Wo Contrast  Result Date: 04/12/2020 CLINICAL DATA:  Cough. Sinus congestion and drainage for 2 weeks refractory to antibiotic therapy. Left chest discomfort. Anemia. Hypoxia. Acute abdominal pain. EXAM: CT CHEST, ABDOMEN AND PELVIS WITHOUT CONTRAST TECHNIQUE: Multidetector CT imaging of the chest, abdomen and pelvis was performed following the standard protocol without IV contrast. COMPARISON:  Chest radiograph from earlier today. 11/04/2019 CT abdomen/pelvis. FINDINGS: CT CHEST FINDINGS Cardiovascular: Borderline mild cardiomegaly. Trace pericardial effusion/thickening. Three-vessel coronary atherosclerosis. Atherosclerotic nonaneurysmal thoracic aorta. Top-normal caliber main pulmonary artery (3.1 cm diameter). Mediastinum/Nodes: No discrete thyroid nodules. Unremarkable esophagus. No pathologically enlarged axillary, mediastinal or hilar lymph nodes, noting limited sensitivity for the detection of hilar adenopathy on this noncontrast study. Lungs/Pleura: No pneumothorax. Small dependent bilateral pleural effusions. Mild dependent passive atelectasis in the lower lobes bilaterally. Compressive atelectasis in the medial left lower lobe from the large hiatal hernia. Mild diffuse interlobular septal thickening. Basilar right lower lobe 7 mm solid pulmonary nodule (series 4/image 90) is stable since 12/10/2018 CT and considered  benign. No acute consolidative airspace disease, lung masses or new significant pulmonary nodules. Musculoskeletal: No aggressive appearing focal osseous lesions. Moderate lower thoracic spondylosis. CT ABDOMEN PELVIS FINDINGS Hepatobiliary: Normal liver with no liver mass. Cholecystectomy. No biliary ductal dilatation. Pancreas: Normal, with no mass or duct dilation. Spleen: Normal size.  No mass. Adrenals/Urinary Tract: Normal adrenals. No hydronephrosis. No renal stones. No contour deforming renal masses. Normal bladder. Stomach/Bowel: Large hiatal hernia. Stomach is nondistended and otherwise normal. Postsurgical changes from subtotal right hemicolectomy with ileocolic anastomosis in the right abdomen. No dilated or thick-walled small bowel loops. Moderate sigmoid diverticulosis. Chronic segmental mild mid sigmoid wall thickening is not substantially changed. No significant acute pericolonic fat stranding. No new large bowel wall thickening. Vascular/Lymphatic: Atherosclerotic nonaneurysmal abdominal aorta. No pathologically enlarged lymph nodes in the abdomen or pelvis. Reproductive: Status post hysterectomy, with no abnormal findings at the vaginal cuff. No adnexal mass. Other: No pneumoperitoneum, ascites or focal fluid collection. Musculoskeletal: No aggressive appearing focal osseous lesions. Mild lumbar spondylosis. IMPRESSION: 1. Borderline mild cardiomegaly. Trace pericardial effusion/thickening. Small dependent bilateral pleural effusions. Mild diffuse interlobular septal thickening, suggesting mild pulmonary edema. These findings suggest mild congestive heart failure. 2. Three-vessel coronary atherosclerosis. 3. Large hiatal hernia. 4. Evidence of chronic moderate sigmoid diverticulosis. No evidence of acute diverticulitis. No evidence of bowel obstruction or acute bowel inflammation. 5. Aortic Atherosclerosis (ICD10-I70.0). Electronically Signed   By: Ilona Sorrel M.D.   On: 04/12/2020 15:19   ECHOCARDIOGRAM COMPLETE  Result Date: 04/13/2020    ECHOCARDIOGRAM REPORT   Patient Name:   Carly Cortez Date of Exam: 04/13/2020 Medical Rec #:  [id number]     Height:       66.0 in Accession #:    [id number]    Weight:       188.0 lb Date of Birth:  [birth year]     BSA:          1.948 m Patient Age:    79 years      BP:           141/62 mmHg Patient Gender: F             HR:           66 bpm.  Exam Location:  [facility] Procedure: 2D Echo, Cardiac Doppler and Color Doppler Indications:     CHF- acute systolic 703.50  History:         Patient has prior history of Echocardiogram examinations, most                  recent 12/18/2016. Risk Factors:Hypertension and Diabetes.  Sonographer:     Sherrie Sport RDCS (AE) Referring Phys:  [id number] Karmen Bongo Diagnosing Phys: Serafina Royals MD  Sonographer Comments: Technically challenging study due to limited acoustic windows. IMPRESSIONS  1. Left ventricular ejection fraction, by estimation, is 45 to 50%. The left ventricle has mildly decreased function. The left ventricle demonstrates regional wall motion abnormalities (see scoring diagram/findings for description). Left ventricular diastolic parameters were normal.  2. Right ventricular systolic function is normal. The right ventricular size is normal. There is normal pulmonary artery systolic pressure.  3. The mitral valve is normal in structure. Mild mitral valve regurgitation.  4. The aortic valve is normal in structure. Aortic valve regurgitation is not visualized. FINDINGS  Left Ventricle: Left ventricular ejection fraction, by estimation, is 45 to 50%. The left ventricle has mildly decreased function. The left ventricle demonstrates regional wall motion abnormalities. Moderate hypokinesis  of the left ventricular, entire septal wall. The left ventricular internal cavity size was normal in size. There is no left ventricular hypertrophy. Left ventricular diastolic parameters were normal. Right Ventricle: The right ventricular size is normal. No increase in right ventricular wall thickness. Right ventricular systolic function is normal. There is normal pulmonary artery systolic pressure. The tricuspid regurgitant velocity is 2.20 m/s, and  with an assumed right atrial pressure of 10 mmHg, the estimated right ventricular systolic pressure is 63.8 mmHg. Left Atrium: Left atrial size was normal in size. Right Atrium: Right  atrial size was normal in size. Pericardium: There is no evidence of pericardial effusion. Mitral Valve: The mitral valve is normal in structure. Mild mitral valve regurgitation. Tricuspid Valve: The tricuspid valve is normal in structure. Tricuspid valve regurgitation is mild. Aortic Valve: The aortic valve is normal in structure. Aortic valve regurgitation is not visualized. Aortic valve mean gradient measures 3.0 mmHg. Aortic valve peak gradient measures 5.3 mmHg. Aortic valve area, by VTI measures 2.70 cm. Pulmonic Valve: The pulmonic valve was normal in structure. Pulmonic valve regurgitation is not visualized. Aorta: The aortic root and ascending aorta are structurally normal, with no evidence of dilitation. IAS/Shunts: No atrial level shunt detected by color flow Doppler.  LEFT VENTRICLE PLAX 2D LVIDd:         4.40 cm  Diastology LVIDs:         2.99 cm  LV e' lateral:   5.44 cm/s LV PW:         1.56 cm  LV E/e' lateral: 15.7 LV IVS:        1.38 cm  LV e' medial:    3.81 cm/s LVOT diam:     2.20 cm  LV E/e' medial:  22.4 LV SV:         61 LV SV Index:   31 LVOT Area:     3.80 cm  RIGHT VENTRICLE RV Basal diam:  3.02 cm RV S prime:     13.70 cm/s TAPSE (M-mode): 2.8 cm LEFT ATRIUM             Index       RIGHT ATRIUM           Index LA diam:        4.90 cm 2.52 cm/m  RA Area:     14.40 cm LA Vol (A2C):   95.8 ml 49.18 ml/m RA Volume:   32.70 ml  16.79 ml/m LA Vol (A4C):   92.6 ml 47.54 ml/m LA Biplane Vol: 95.6 ml 49.08 ml/m  AORTIC VALVE                   PULMONIC VALVE AV Area (Vmax):    1.73 cm    PV Vmax:        0.74 m/s AV Area (Vmean):   1.77 cm    PV Peak grad:   2.2 mmHg AV Area (VTI):     2.70 cm    RVOT Peak grad: 3 mmHg AV Vmax:           115.50 cm/s AV Vmean:          80.700 cm/s AV VTI:            0.226 m AV Peak Grad:      5.3 mmHg AV Mean Grad:      3.0 mmHg LVOT Vmax:         52.50 cm/s LVOT Vmean:  37.600 cm/s LVOT VTI:          0.161 m LVOT/AV VTI ratio: 0.71  AORTA Ao Root  diam: 3.00 cm MITRAL VALVE                TRICUSPID VALVE MV Area (PHT): 3.20 cm     TR Peak grad:   19.4 mmHg MV Decel Time: 237 msec     TR Vmax:        220.00 cm/s MV E velocity: 85.40 cm/s MV A velocity: 101.00 cm/s  SHUNTS MV E/A ratio:  0.85         Systemic VTI:  0.16 m                             Systemic Diam: 2.20 cm Serafina Royals MD Electronically signed by Serafina Royals MD Signature Date/Time: 04/13/2020/3:43:14 PM    Final      Echo preserved left ventricular function EF around 45 to 50%  TELEMETRY: Normal sinus rhythm rate of 80:  ASSESSMENT AND PLAN:  Principal Problem:   Acute on chronic diastolic CHF (congestive heart failure) (HCC) Active Problems:   Controlled type 2 diabetes with neuropathy (HCC)   History of cancer of vulva   Essential hypertension   Hyperlipidemia   CAD (coronary artery disease)   Chronic renal failure, stage 3a   Anemia   Class 1 obesity due to excess calories with body mass index (BMI) of 31.0 to 31.9 in adult   Lobar pneumonia (HCC)   Hypotension   Acute kidney injury superimposed on CKD ([place])   Acute respiratory failure with hypoxia ([place])    Plan Agree with current therapy for blood pressure control Continue conservative management for diastolic heart failure low-dose diuretics as necessary Continue medication for hyperlipidemia Recommend modest weight loss exercise portion control Chronic renal insufficiency continue hydration have the patient follow-up with nephrology Anemia continue work-up continue iron follow-up with hematology Patient stable enough to be discharged home have the patient follow-up with cardiology 1 to 2 weeks   Yolonda Kida, MD 04/14/2020 1:30 PM

## 2020-04-14 NOTE — Progress Notes (Signed)
Mobility Specialist - Progress Note   04/14/20 1443  Mobility  Activity Transferred:  Bed to chair  Level of Assistance Minimal assist, patient does 75% or more  Assistive Device Front wheel walker  Distance Ambulated (ft) 5 ft  Mobility Response Tolerated well  Mobility performed by Mobility specialist  $Mobility charge 1 Mobility    Post-mobility: 73 HR, 136/69 BP, 96% SpO2   Pt in bed upon arrival. Pt agreed to session. Pt able to get to EOB SBA. Pt able to S2S min. Assist using RW. Urinary incontinence limited pt's ability to initially stand. Pt had an incontinent episode both times standing up. Mobility specialist helped clean pt up and wiped the floors to prevent slipping. Pt able to transfer to recliner w/ min. Assist. Overall, pt tolerated session well. Orthostatic VS were measured during session, they were as followed: Laying in bed:  75 HR, 119/54 BP, 98% SpO2, Sitting EOB: 75 HR, 128/62 BP, 95% SpO2, Standing: 75 HR, 135/68 BP, 98% SpO2. Pt left in recliner with phone and call bell in reach. Nurse was notified.     Aoi Kouns Mobility Specialist  04/14/20, 2:50 PM

## 2020-04-15 DIAGNOSIS — I10 Essential (primary) hypertension: Secondary | ICD-10-CM

## 2020-04-15 DIAGNOSIS — I5033 Acute on chronic diastolic (congestive) heart failure: Secondary | ICD-10-CM

## 2020-04-15 DIAGNOSIS — N179 Acute kidney failure, unspecified: Secondary | ICD-10-CM

## 2020-04-15 DIAGNOSIS — N189 Chronic kidney disease, unspecified: Secondary | ICD-10-CM

## 2020-04-15 LAB — CBC
HCT: 28.4 % — ABNORMAL LOW (ref 36.0–46.0)
Hemoglobin: 8.1 g/dL — ABNORMAL LOW (ref 12.0–15.0)
MCH: 18.5 pg — ABNORMAL LOW (ref 26.0–34.0)
MCHC: 28.5 g/dL — ABNORMAL LOW (ref 30.0–36.0)
MCV: 64.8 fL — ABNORMAL LOW (ref 80.0–100.0)
Platelets: 294 10*3/uL (ref 150–400)
RBC: 4.38 MIL/uL (ref 3.87–5.11)
RDW: 21.8 % — ABNORMAL HIGH (ref 11.5–15.5)
WBC: 4.8 10*3/uL (ref 4.0–10.5)
nRBC: 0.4 % — ABNORMAL HIGH (ref 0.0–0.2)

## 2020-04-15 LAB — BASIC METABOLIC PANEL
Anion gap: 9 (ref 5–15)
BUN: 27 mg/dL — ABNORMAL HIGH (ref 8–23)
CO2: 25 mmol/L (ref 22–32)
Calcium: 8.5 mg/dL — ABNORMAL LOW (ref 8.9–10.3)
Chloride: 108 mmol/L (ref 98–111)
Creatinine, Ser: 1.38 mg/dL — ABNORMAL HIGH (ref 0.44–1.00)
GFR calc Af Amer: 42 mL/min — ABNORMAL LOW (ref >60)
GFR calc non Af Amer: 37 mL/min — ABNORMAL LOW (ref >60)
Glucose, Bld: 79 mg/dL (ref 70–99)
Potassium: 4.2 mmol/L (ref 3.5–5.1)
Sodium: 142 mmol/L (ref 135–145)

## 2020-04-15 LAB — GLUCOSE, CAPILLARY
Glucose-Capillary: 71 mg/dL (ref 70–99)
Glucose-Capillary: 172 mg/dL — ABNORMAL HIGH (ref 70–99)

## 2020-04-15 MED ORDER — ENOXAPARIN SODIUM 40 MG/0.4ML ~~LOC~~ SOLN: 40.0000 mg | SUBCUTANEOUS | Status: DC

## 2020-04-15 MED ORDER — AZITHROMYCIN 250 MG PO TABS: 250.0000 mg | ORAL_TABLET | Freq: Every day | ORAL | 0 refills | Status: AC

## 2020-04-15 MED ORDER — POLYETHYLENE GLYCOL 3350 17 G PO PACK: 17.0000 g | PACK | Freq: Every day | ORAL | Status: DC | PRN

## 2020-04-15 NOTE — TOC Transition Note (Signed)
Transition of Care [facility]) - CM/SW Discharge Note   Patient Details  Name: Carly Cortez MRN: [id number] Date of Birth: 1940-09-13  [facility]) CM/SW Contact:  Victorino Dike, RN Phone Number: 04/15/2020, 10:16 AM   Clinical Narrative:    Met with patient this day.  She agrees to [facility] PT and OT services provided by [facility].  She reports having a walker at home.  Her family is able to pick her up at discharge.   Final next level of care: [place] Barriers to Discharge: Barriers Resolved   Patient Goals and CMS Choice Patient states their goals for this hospitalization and ongoing recovery are:: to get stronger   Choice offered to / list presented to : Patient  Discharge Placement                       Discharge Plan and Services In-house Referral: Clinical Social Work   Post Acute Care Choice: Home Health                    HH Arranged: PT, OT [facility] Agency: [place] ([facility]) Date HH Agency Contacted: 04/15/20 Time [place]: [place] Representative spoke with at [place]: [place] ([place]) Interventions     Readmission Risk Interventions Readmission Risk Prevention Plan 04/14/2020  Transportation Screening Complete  PCP or Specialist Appt within 3-5 Days Complete  HRI or [place] Complete  Social Work Consult for [place] Planning/Counseling Complete  Palliative Care Screening Not Applicable  Medication Review Press photographer) Complete  Some recent data might be hidden

## 2020-04-15 NOTE — Progress Notes (Signed)
Mobility Specialist - Progress Note   04/15/20 1324  Mobility  Activity Ambulated in hall  Level of Assistance Minimal assist, patient does 75% or more  Assistive Device Front wheel walker  Distance Ambulated (ft) 185 ft  Mobility Response Tolerated well  Mobility performed by Mobility specialist  $Mobility charge 1 Mobility    Pre-mobility: 74 HR, 151/72 BP, 98% SpO2 During mobility: 81 HR, 98% SpO2 Post-mobility: 71 HR, 122/61 BP, 96% SpO2   Pt was lying in bed, finishing lunch upon arrival. Pt agreed to session. Pt was minA in both sit-to-stand and while ambulating with RW, CGA was used for safety. Pt ambulated 185' total with [place] sitting at 1L. Pt would take a standing break after every 40'. Heavy breathing was noted during ambulation, O2 remained >98% throughout session. Mobility tech went over pursed-lip breathing exercises with pt. Overall, pt tolerated session well. Once getting pt to recliner, pt c/o chest tightness. Session was concluded and RN was notified immediately. Phone and call bell are within reach.    Kathee Delton Mobility Specialist 04/15/20, 1:39 PM

## 2020-04-15 NOTE — Progress Notes (Signed)
[facility]    SUBJECTIVE: Patient states to be done reasonably well does not feel short of breath right now she is trying to ambulate to be sure that she has additional strength denies any back as per the syncope no chest pain no worsening shortness of breath Case was also discussed with daughter Carly Cortez the patient feels well enough to be discharged home   Vitals:   04/15/20 0511 04/15/20 0728 04/15/20 0731 04/15/20 1146  BP: 136/68 (!) 127/57  121/69  Pulse: 75 71  69  Resp:  19  19  Temp: 98.6 F (37 C) 98.1 F (36.7 C)  98 F (36.7 C)  TempSrc: Oral Oral  Oral  SpO2: 94% 95% 96% 95%  Weight: 87.4 kg     Height:         Intake/Output Summary (Last 24 hours) at 04/15/2020 1218 Last data filed at 04/15/2020 0559 Gross per 24 hour  Intake 720 ml  Output 800 ml  Net -80 ml      PHYSICAL EXAM  General: Well developed, well nourished, in no acute distress HEENT:  Normocephalic and atramatic Neck:  No JVD.  Lungs: Clear bilaterally to auscultation and percussion. Heart: HRRR . Normal S1 and S2 without gallops or murmurs.  Abdomen: Bowel sounds are positive, abdomen soft and non-tender  Msk:  Back normal, normal gait. Normal strength and tone for age. Extremities: No clubbing, cyanosis or edema.   Neuro: Alert and oriented X 3. Psych:  Good affect, responds appropriately   LABS: Basic Metabolic Panel: Recent Labs    04/12/20 1420 04/13/20 0635 04/14/20 0534 04/15/20 0632  NA 140   < > 140 142  K 3.8   < > 3.4* 4.2  CL 105   < > 103 108  CO2 24   < > 25 25  GLUCOSE 109*   < > 74 79  BUN 14   < > 29* 27*  CREATININE 1.27*   < > 1.68* 1.38*  CALCIUM 8.6*   < > 8.4* 8.5*  MG 2.2  --   --   --    < > = values in this interval not displayed.   Liver Function Tests: Recent Labs    04/12/20 1237 04/12/20 1420  AST 12* 14*  ALT 6 6  ALKPHOS 79 77  BILITOT 0.9 1.0  PROT 7.2 7.1  ALBUMIN 3.7 3.6   Recent Labs    04/12/20 1420  LIPASE 22    CBC: Recent Labs    04/12/20 1420 04/12/20 1420 04/13/20 0635 04/13/20 0635 04/14/20 0534 04/15/20 0632  WBC 5.9   < > 4.8  --   --  4.8  NEUTROABS 4.2  --  3.3  --   --   --   HGB 7.3*   < > 7.2*   < > 8.0* 8.1*  HCT 27.4*   < > 26.7*  --   --  28.4*  MCV 64.8*   < > 64.6*  --   --  64.8*  PLT 350   < > 319  --   --  294   < > = values in this interval not displayed.   Cardiac Enzymes: No results for input(s): CKTOTAL, CKMB, CKMBINDEX, TROPONINI in the last 72 hours. BNP: Invalid input(s): POCBNP D-Dimer: No results for input(s): DDIMER in the last 72 hours. Hemoglobin A1C: No results for input(s): HGBA1C in the last 72 hours. Fasting Lipid Panel: No results for input(s): CHOL, HDL, LDLCALC,  TRIG, CHOLHDL, LDLDIRECT in the last 72 hours. Thyroid Function Tests: No results for input(s): TSH, T4TOTAL, T3FREE, THYROIDAB in the last 72 hours.  Invalid input(s): FREET3 Anemia Panel: Recent Labs    04/12/20 1858  VITAMINB12 121*  FOLATE 9.4  FERRITIN 4*  TIBC 489*  IRON 17*  RETICCTPCT 2.5    No results found.   Echo echocardiogram ejection fraction between 45 and 50%  TELEMETRY: Normal sinus rhythm rate of 75 nonspecific changes:  ASSESSMENT AND PLAN:  Principal Problem:   Acute on chronic diastolic CHF (congestive heart failure) (HCC) Active Problems:   Type 2 diabetes mellitus with hypoglycemia without coma, with long-term current use of insulin (HCC)   History of cancer of vulva   Essential hypertension   Hyperlipidemia   CAD (coronary artery disease)   Chronic renal failure, stage 3a   Anemia   Class 1 obesity due to excess calories with body mass index (BMI) of 31.0 to 31.9 in adult   Lobar pneumonia (HCC)   Hypotension   Acute kidney injury superimposed on CKD ([place])   Acute respiratory failure with hypoxia ([place])    Plan Patient had an episode of slight lightheadedness yesterday but feels much better today Continue diabetes management and  control Maintain hypertension management Continue diuretics as necessary for combined diastolic systolic heart failure Anemia continue to follow-up treatment with hematology input continue iron Recommend modest weight loss exercise portion control Consider follow-up with nephrology for renal sufficiency Okay to discharge home today have the patient follow-up with cardiology 1 to 2 weeks   Yolonda Kida, MD 04/15/2020 12:18 PM

## 2020-04-15 NOTE — Plan of Care (Signed)
  Problem: Clinical Measurements: Goal: Respiratory complications will improve Outcome: Progressing  Pt weaned down and on continuous pulse ox monitoring

## 2020-04-15 NOTE — Care Management Important Message (Signed)
Important Message  Patient Details  Name: Carly Cortez MRN: [id number] Date of Birth: [birth year]   Medicare Important Message Given:  Yes     Loann Quill 04/15/2020, 11:19 AM

## 2020-04-15 NOTE — Discharge Summary (Signed)
Physician Discharge Summary  Carly Cortez:[id number] DOB: 04-15-1941 DOA: 04/12/2020  PCP: Verita Lamb, NP  Admit date: 04/12/2020 Discharge date: 04/15/2020  Admitted From: home Disposition:  Home w/ home health   Recommendations for Outpatient Follow-up:  1. Follow up with PCP in 1-2 weeks  Home Health: yes Equipment/Devices:  Discharge Condition: stable  CODE STATUS: full  Diet recommendation: Heart Healthy / Carb Modified  Brief/Interim Summary: HPI was taken from Dr. Lorin Mercy: ARAIYA Cortez is a 79 y.o. female with medical history significant of HTN; HLD; DM; CKD; CAD; chronic combined CHF; and vulvar cancer presenting with SOB.  She has had some sinus issues.  She was taking an antibiotic for UTI prior to Tuesday.  Saw her PCP and was placed on another antibiotic for sinusitis - inadvertently took both Augmentin and Cefdinir simultaneously for a few doses.  She has been coughing up some mucus, +orthopnea, SOB, CP.  She took a NTG today.  She takes NTG on average 4-5 times a week.  She has not seen blood in her stools (has h/o hemorrhoids, no flare).  She has a h/o anemia, required transfusion in the past with Hgb 4.  She feels mildly SOB now.  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  She took antibiotic for UTI recently and it appears to have cleared up - no current UTI symptoms.      ED Course:  Symptomatic anemia - ?source, heme negative, denies vaginal bleeding. Hgb dropped 2 points.  UA unremarkable, recent UTI.  Sent by EMS from [facility] for anemia, ?early PNA, sinusitis not improving, L CP today better with NTG.  Work-up reassuring, on 2L (new).  Given Rocephin, Azithro.  Mild volume overload?  BMP 400.  Hospital Course from Dr. Lenise Herald 04/15/20: Pt was  found to have acute on chronic diastolic CHF which was treated w/ lasix. Pt diuresed well. Pt also required supplemental oxygen while inpatient but pt was able to be weaned from supplemental oxygen. Of note, pt was also found to have pneumonia treated w/ IV rocephin, azithromycin, bronchodilators, & incentive spirometry. PT/OT saw the pt and recommends home health. Home health was set up by CM prior to d/c.   Discharge Diagnoses:  Principal Problem:   Acute on chronic diastolic CHF (congestive heart failure) (HCC) Active Problems:   Type 2 diabetes mellitus with hypoglycemia without coma, with long-term current use of insulin (HCC)   History of cancer of vulva   Essential hypertension   Hyperlipidemia   CAD (coronary artery disease)   Chronic renal failure, stage 3a   Anemia   Class 1 obesity due to excess calories with body mass index (BMI) of 31.0 to 31.9 in adult   Lobar pneumonia (HCC)   Hypotension   Acute kidney injury superimposed on CKD ([place])   Acute respiratory failure with hypoxia (HCC) Acute on chronic diastolic CHF:  continue on lasix. Monitor I/Os ACE-I or ARB as per cardio as an outpatient.   Acute hypoxic respiratory failure: weaned off of supplemental oxygen. Resolved   Iron deficiency anemia: s/p PRBCs and IV iron. H&H are stable. Will continue to monitor   Hypotension: resolved  Pneumonia: continue on rocephin, azithromycin & bronchodilators. Encourage incentive spirometry   AKI on CKDIIIb:  Cr is trending down from  day prior. Hold nephrotoxic meds.   DM2: continue on SSI w/ accuchecks. Carb modified diet   History of vulvar cancer: management per oncology as an outpatient   Discharge Instructions  Discharge Instructions    Diet - low sodium heart healthy   Complete by: As directed    Discharge instructions   Complete by: As directed    F/u PCP in 1-2 weeks. F/u cardio, Dr. Clayborn Bigness, in 1-2 weeks   Increase activity slowly   Complete by: As directed       Allergies as of 04/15/2020      Reactions   Contrast Media [iodinated Diagnostic Agents] Swelling   Colchicine Other (See Comments)   Gallbladder Problems   Etodolac Swelling   Indomethacin Swelling   Oxaprozin Swelling      Medication List    TAKE these medications   amLODipine 2.5 MG tablet Commonly known as: NORVASC Take 2.5 mg by mouth daily.   aspirin 81 MG tablet Take 81 mg by mouth every other day.   azithromycin 250 MG tablet Commonly known as: ZITHROMAX Take 1 tablet (250 mg total) by mouth daily for 2 days.   carvedilol 25 MG tablet Commonly known as: COREG Take 25 mg by mouth 2 (two) times daily.   clopidogrel 75 MG tablet Commonly known as: PLAVIX Take 75 mg by mouth daily.   estradiol 0.1 MG/GM vaginal cream Commonly known as: ESTRACE Place 2 g vaginally daily.   fexofenadine 180 MG tablet Commonly known as: ALLEGRA Take 180 mg by mouth daily.   furosemide 20 MG tablet Commonly known as: LASIX Take 20 mg by mouth See admin instructions. Take 1 tablet (20mg ) by mouth daily and take 1 additional tablet (20mg ) by mouth daily as needed for fluid   isosorbide mononitrate 120 MG 24 hr tablet Commonly known as: IMDUR Take 120 mg by mouth daily.   Lantus SoloStar 100 UNIT/ML Solostar Pen Generic drug: insulin glargine Inject 44 Units into the skin 2 (two) times daily.   meloxicam 15 MG tablet Commonly known as: MOBIC Take 15 mg by mouth daily.   mometasone 0.1 % cream Commonly known as: ELOCON Apply 1 application topically 2 (two) times daily.   nitroGLYCERIN 0.4 MG SL tablet Commonly known as: NITROSTAT Place 0.4 mg under the tongue every 5 (five) minutes as needed for chest pain.   ondansetron 8 MG tablet Commonly known as: ZOFRAN Take 8 mg by mouth every 8 (eight) hours as needed.   pantoprazole 40 MG tablet Commonly known as: PROTONIX Take 40 mg by mouth daily.   pregabalin 50 MG capsule Commonly known as: LYRICA Take 50 mg by  mouth 2 (two) times daily.   Repatha SureClick 267 MG/ML Soaj Generic drug: Evolocumab Inject 140 mg into the skin every 14 (fourteen) days.       Follow-up Information    [facility] Follow up on 04/23/2020.   Specialty: Cardiology Why: at 3:30pm. Enter through the [facility] entrance Contact information: [address] [place]             Allergies  Allergen Reactions  . Contrast Media [Iodinated Diagnostic Agents] Swelling  . Colchicine Other (See Comments)    Gallbladder Problems  . Etodolac Swelling  . Indomethacin Swelling  . Oxaprozin Swelling    Consultations: Cardio   Procedures/Studies: CT ABDOMEN PELVIS WO CONTRAST  Result Date: 04/12/2020 CLINICAL DATA:  Cough. Sinus congestion and  drainage for 2 weeks refractory to antibiotic therapy. Left chest discomfort. Anemia. Hypoxia. Acute abdominal pain. EXAM: CT CHEST, ABDOMEN AND PELVIS WITHOUT CONTRAST TECHNIQUE: Multidetector CT imaging of the chest, abdomen and pelvis was performed following the standard protocol without IV contrast. COMPARISON:  Chest radiograph from earlier today. 11/04/2019 CT abdomen/pelvis. FINDINGS: CT CHEST FINDINGS Cardiovascular: Borderline mild cardiomegaly. Trace pericardial effusion/thickening. Three-vessel coronary atherosclerosis. Atherosclerotic nonaneurysmal thoracic aorta. Top-normal caliber main pulmonary artery (3.1 cm diameter). Mediastinum/Nodes: No discrete thyroid nodules. Unremarkable esophagus. No pathologically enlarged axillary, mediastinal or hilar lymph nodes, noting limited sensitivity for the detection of hilar adenopathy on this noncontrast study. Lungs/Pleura: No pneumothorax. Small dependent bilateral pleural effusions. Mild dependent passive atelectasis in the lower lobes bilaterally. Compressive atelectasis in the medial left lower lobe from the large hiatal hernia. Mild  diffuse interlobular septal thickening. Basilar right lower lobe 7 mm solid pulmonary nodule (series 4/image 90) is stable since 12/10/2018 CT and considered benign. No acute consolidative airspace disease, lung masses or new significant pulmonary nodules. Musculoskeletal: No aggressive appearing focal osseous lesions. Moderate lower thoracic spondylosis. CT ABDOMEN PELVIS FINDINGS Hepatobiliary: Normal liver with no liver mass. Cholecystectomy. No biliary ductal dilatation. Pancreas: Normal, with no mass or duct dilation. Spleen: Normal size. No mass. Adrenals/Urinary Tract: Normal adrenals. No hydronephrosis. No renal stones. No contour deforming renal masses. Normal bladder. Stomach/Bowel: Large hiatal hernia. Stomach is nondistended and otherwise normal. Postsurgical changes from subtotal right hemicolectomy with ileocolic anastomosis in the right abdomen. No dilated or thick-walled small bowel loops. Moderate sigmoid diverticulosis. Chronic segmental mild mid sigmoid wall thickening is not substantially changed. No significant acute pericolonic fat stranding. No new large bowel wall thickening. Vascular/Lymphatic: Atherosclerotic nonaneurysmal abdominal aorta. No pathologically enlarged lymph nodes in the abdomen or pelvis. Reproductive: Status post hysterectomy, with no abnormal findings at the vaginal cuff. No adnexal mass. Other: No pneumoperitoneum, ascites or focal fluid collection. Musculoskeletal: No aggressive appearing focal osseous lesions. Mild lumbar spondylosis. IMPRESSION: 1. Borderline mild cardiomegaly. Trace pericardial effusion/thickening. Small dependent bilateral pleural effusions. Mild diffuse interlobular septal thickening, suggesting mild pulmonary edema. These findings suggest mild congestive heart failure. 2. Three-vessel coronary atherosclerosis. 3. Large hiatal hernia. 4. Evidence of chronic moderate sigmoid diverticulosis. No evidence of acute diverticulitis. No evidence of bowel  obstruction or acute bowel inflammation. 5. Aortic Atherosclerosis (ICD10-I70.0). Electronically Signed   By: Ilona Sorrel M.D.   On: 04/12/2020 15:19   DG Chest 2 View  Result Date: 04/12/2020 CLINICAL DATA:  Chest pain. Sinus congestion and drainage started 2 weeks ago. Shortness of breath and chest pain. EXAM: CHEST - 2 VIEW COMPARISON:  11/04/2019 FINDINGS: Heart is enlarged and stable in configuration. Moderate hiatal hernia again noted. The mildly prominent interstitial markings are chronic. There is more focal opacity at the lung bases, raising the question of early infiltrates. Bilateral basilar pleural thickening or pleural fluid. The appearance favors infectious/inflammatory process over pulmonary edema. Exaggerated thoracic kyphosis and associated degenerative changes for stable. IMPRESSION: 1. Cardiomegaly and hiatal hernia. 2. Early infiltrates and pleural changes at the bases. Electronically Signed   By: Nolon Nations M.D.   On: 04/12/2020 13:10   CT Chest Wo Contrast  Result Date: 04/12/2020 CLINICAL DATA:  Cough. Sinus congestion and drainage for 2 weeks refractory to antibiotic therapy. Left chest discomfort. Anemia. Hypoxia. Acute abdominal pain. EXAM: CT CHEST, ABDOMEN AND PELVIS WITHOUT CONTRAST TECHNIQUE: Multidetector CT imaging of the chest, abdomen and pelvis was performed following the standard protocol without IV contrast. COMPARISON:  Chest radiograph from earlier today. 11/04/2019 CT abdomen/pelvis. FINDINGS: CT CHEST FINDINGS Cardiovascular: Borderline mild cardiomegaly. Trace pericardial effusion/thickening. Three-vessel coronary atherosclerosis. Atherosclerotic nonaneurysmal thoracic aorta. Top-normal caliber main pulmonary artery (3.1 cm diameter). Mediastinum/Nodes: No discrete thyroid nodules. Unremarkable esophagus. No pathologically enlarged axillary, mediastinal or hilar lymph nodes, noting limited sensitivity for the detection of hilar adenopathy on this noncontrast  study. Lungs/Pleura: No pneumothorax. Small dependent bilateral pleural effusions. Mild dependent passive atelectasis in the lower lobes bilaterally. Compressive atelectasis in the medial left lower lobe from the large hiatal hernia. Mild diffuse interlobular septal thickening. Basilar right lower lobe 7 mm solid pulmonary nodule (series 4/image 90) is stable since 12/10/2018 CT and considered benign. No acute consolidative airspace disease, lung masses or new significant pulmonary nodules. Musculoskeletal: No aggressive appearing focal osseous lesions. Moderate lower thoracic spondylosis. CT ABDOMEN PELVIS FINDINGS Hepatobiliary: Normal liver with no liver mass. Cholecystectomy. No biliary ductal dilatation. Pancreas: Normal, with no mass or duct dilation. Spleen: Normal size. No mass. Adrenals/Urinary Tract: Normal adrenals. No hydronephrosis. No renal stones. No contour deforming renal masses. Normal bladder. Stomach/Bowel: Large hiatal hernia. Stomach is nondistended and otherwise normal. Postsurgical changes from subtotal right hemicolectomy with ileocolic anastomosis in the right abdomen. No dilated or thick-walled small bowel loops. Moderate sigmoid diverticulosis. Chronic segmental mild mid sigmoid wall thickening is not substantially changed. No significant acute pericolonic fat stranding. No new large bowel wall thickening. Vascular/Lymphatic: Atherosclerotic nonaneurysmal abdominal aorta. No pathologically enlarged lymph nodes in the abdomen or pelvis. Reproductive: Status post hysterectomy, with no abnormal findings at the vaginal cuff. No adnexal mass. Other: No pneumoperitoneum, ascites or focal fluid collection. Musculoskeletal: No aggressive appearing focal osseous lesions. Mild lumbar spondylosis. IMPRESSION: 1. Borderline mild cardiomegaly. Trace pericardial effusion/thickening. Small dependent bilateral pleural effusions. Mild diffuse interlobular septal thickening, suggesting mild pulmonary  edema. These findings suggest mild congestive heart failure. 2. Three-vessel coronary atherosclerosis. 3. Large hiatal hernia. 4. Evidence of chronic moderate sigmoid diverticulosis. No evidence of acute diverticulitis. No evidence of bowel obstruction or acute bowel inflammation. 5. Aortic Atherosclerosis (ICD10-I70.0). Electronically Signed   By: Ilona Sorrel M.D.   On: 04/12/2020 15:19   ECHOCARDIOGRAM COMPLETE  Result Date: 04/13/2020    ECHOCARDIOGRAM REPORT   Patient Name:   LILLIAH PRIEGO Date of Exam: 04/13/2020 Medical Rec #:  [id number]     Height:       66.0 in Accession #:    [id number]    Weight:       188.0 lb Date of Birth:  1941/08/28     BSA:          1.948 m Patient Age:    79 years      BP:           141/62 mmHg Patient Gender: F             HR:           66 bpm. Exam Location:  [facility] Procedure: 2D Echo, Cardiac Doppler and Color Doppler Indications:     CHF- acute systolic 664.40  History:         Patient has prior history of Echocardiogram examinations, most                  recent 12/18/2016. Risk Factors:Hypertension and Diabetes.  Sonographer:     Sherrie Sport RDCS (AE) Referring Phys:  [id number] Karmen Bongo Diagnosing Phys: Serafina Royals MD  Sonographer Comments: Technically challenging study due to limited acoustic windows. IMPRESSIONS  1. Left ventricular ejection fraction, by estimation, is 45 to 50%. The left ventricle has mildly decreased function. The left ventricle demonstrates regional wall motion abnormalities (see scoring diagram/findings for description). Left ventricular diastolic parameters were normal.  2. Right ventricular systolic function is normal. The right ventricular size is normal. There is normal pulmonary artery systolic pressure.  3. The mitral valve is normal in structure. Mild mitral valve regurgitation.  4. The aortic valve is normal in structure. Aortic valve regurgitation is not visualized. FINDINGS  Left Ventricle: Left ventricular ejection fraction, by estimation,  is 45 to 50%. The left ventricle has mildly decreased function. The left ventricle demonstrates regional wall motion abnormalities. Moderate hypokinesis of the left ventricular, entire septal wall. The left ventricular internal cavity size was normal in size. There is no left ventricular hypertrophy. Left ventricular diastolic parameters were normal. Right Ventricle: The right ventricular size is normal. No increase in right ventricular wall thickness. Right ventricular systolic function is normal. There is normal pulmonary artery systolic pressure. The tricuspid regurgitant velocity is 2.20 m/s, and  with an assumed right atrial pressure of 10 mmHg, the estimated right ventricular systolic pressure is 35.3 mmHg. Left Atrium: Left atrial size was normal in size. Right Atrium: Right atrial size was normal in size. Pericardium: There is no evidence of pericardial effusion. Mitral Valve: The mitral valve is normal in structure. Mild mitral valve regurgitation. Tricuspid Valve: The tricuspid valve is normal in structure. Tricuspid valve regurgitation is mild. Aortic Valve: The aortic valve is normal in structure. Aortic valve regurgitation is not visualized. Aortic valve mean gradient measures 3.0 mmHg. Aortic valve peak gradient measures 5.3 mmHg. Aortic valve area, by VTI measures 2.70 cm. Pulmonic Valve: The pulmonic valve was normal in structure. Pulmonic valve regurgitation is not visualized. Aorta: The aortic root and ascending aorta are structurally normal, with no evidence of dilitation. IAS/Shunts: No atrial level shunt detected by color flow Doppler.  LEFT VENTRICLE PLAX 2D LVIDd:         4.40 cm  Diastology LVIDs:         2.99 cm  LV e' lateral:   5.44 cm/s LV PW:         1.56 cm  LV E/e' lateral: 15.7 LV IVS:        1.38 cm  LV e' medial:    3.81 cm/s LVOT diam:     2.20 cm  LV E/e' medial:  22.4 LV SV:         61 LV SV Index:   31 LVOT Area:     3.80 cm  RIGHT VENTRICLE RV Basal diam:  3.02 cm RV S prime:      13.70 cm/s TAPSE (M-mode): 2.8 cm LEFT ATRIUM             Index       RIGHT ATRIUM           Index LA diam:        4.90 cm 2.52 cm/m  RA Area:     14.40 cm LA Vol (A2C):   95.8 ml 49.18 ml/m RA Volume:   32.70 ml  16.79 ml/m LA Vol (A4C):   92.6 ml 47.54 ml/m LA Biplane Vol: 95.6 ml 49.08 ml/m  AORTIC VALVE                   PULMONIC VALVE AV Area (Vmax):    1.73 cm    PV Vmax:  0.74 m/s AV Area (Vmean):   1.77 cm    PV Peak grad:   2.2 mmHg AV Area (VTI):     2.70 cm    RVOT Peak grad: 3 mmHg AV Vmax:           115.50 cm/s AV Vmean:          80.700 cm/s AV VTI:            0.226 m AV Peak Grad:      5.3 mmHg AV Mean Grad:      3.0 mmHg LVOT Vmax:         52.50 cm/s LVOT Vmean:        37.600 cm/s LVOT VTI:          0.161 m LVOT/AV VTI ratio: 0.71  AORTA Ao Root diam: 3.00 cm MITRAL VALVE                TRICUSPID VALVE MV Area (PHT): 3.20 cm     TR Peak grad:   19.4 mmHg MV Decel Time: 237 msec     TR Vmax:        220.00 cm/s MV E velocity: 85.40 cm/s MV A velocity: 101.00 cm/s  SHUNTS MV E/A ratio:  0.85         Systemic VTI:  0.16 m                             Systemic Diam: 2.20 cm Serafina Royals MD Electronically signed by Serafina Royals MD Signature Date/Time: 04/13/2020/3:43:14 PM    Final     Subjective: Pt c/o malaise   Discharge Exam: Vitals:   04/15/20 0731 04/15/20 1146  BP:  121/69  Pulse:  69  Resp:  19  Temp:  98 F (36.7 C)  SpO2: 96% 95%   Vitals:   04/15/20 0511 04/15/20 0728 04/15/20 0731 04/15/20 1146  BP: 136/68 (!) 127/57  121/69  Pulse: 75 71  69  Resp:  19  19  Temp: 98.6 F (37 C) 98.1 F (36.7 C)  98 F (36.7 C)  TempSrc: Oral Oral  Oral  SpO2: 94% 95% 96% 95%  Weight: 87.4 kg     Height:        General: Pt is alert, awake, not in acute distress Cardiovascular: S1/S2 +, no rubs, no gallops Respiratory: decreased breath sounds b/l. No rales  Abdominal: Soft, NT, obese, bowel sounds + Extremities: no cyanosis    The results of significant  diagnostics from this hospitalization (including imaging, microbiology, ancillary and laboratory) are listed below for reference.     Microbiology: Recent Results (from the past 240 hour(s))  Blood culture (routine x 2)     Status: None (Preliminary result)   Collection Time: 04/12/20  4:18 PM   Specimen: BLOOD  Result Value Ref Range Status   Specimen Description BLOOD LEFT ANTECUBITAL  Final   Special Requests   Final    BOTTLES DRAWN AEROBIC AND ANAEROBIC Blood Culture adequate volume   Culture   Final    NO GROWTH 3 DAYS Performed at [facility], [address] [place], [place] [address]    Report Status PENDING  Incomplete  Blood culture (routine x 2)     Status: None (Preliminary result)   Collection Time: 04/12/20  4:18 PM   Specimen: BLOOD  Result Value Ref Range Status   Specimen Description BLOOD RIGHT ANTECUBITAL  Final  Special Requests   Final    BOTTLES DRAWN AEROBIC AND ANAEROBIC Blood Culture adequate volume   Culture   Final    NO GROWTH 3 DAYS Performed at [facility], [address] [place], [place] [address]    Report Status PENDING  Incomplete  SARS Coronavirus 2 by RT PCR (hospital order, performed in [facility] hospital lab) Nasopharyngeal Nasopharyngeal Swab     Status: None   Collection Time: 04/12/20  4:18 PM   Specimen: Nasopharyngeal Swab  Result Value Ref Range Status   SARS Coronavirus 2 NEGATIVE NEGATIVE Final    Comment: (NOTE) SARS-CoV-2 target nucleic acids are NOT DETECTED.  The SARS-CoV-2 RNA is generally detectable in upper and lower respiratory specimens during the acute phase of infection. The lowest concentration of SARS-CoV-2 viral copies this assay can detect is 250 copies / mL. A negative result does not preclude SARS-CoV-2 infection and should not be used as the sole basis for treatment or other patient management decisions.  A negative result may occur with improper specimen collection / handling,  submission of specimen other than nasopharyngeal swab, presence of viral mutation(s) within the areas targeted by this assay, and inadequate number of viral copies (<250 copies / mL). A negative result must be combined with clinical observations, patient history, and epidemiological information.  Fact Sheet for Patients:   StrictlyIdeas.no  Fact Sheet for Healthcare Providers: BankingDealers.co.za  This test is not yet approved or  cleared by the [place] FDA and has been authorized for detection and/or diagnosis of SARS-CoV-2 by FDA under an Emergency Use Authorization (EUA).  This EUA will remain in effect (meaning this test can be used) for the duration of the COVID-19 declaration under Section 564(b)(1) of the Act, 21 U.S.C. section 360bbb-3(b)(1), unless the authorization is terminated or revoked sooner.  Performed at [facility], [place], [place] [address]   C Difficile Quick Screen w PCR reflex     Status: None   Collection Time: 04/13/20  9:31 AM   Specimen: STOOL  Result Value Ref Range Status   C Diff antigen NEGATIVE NEGATIVE Final   C Diff toxin NEGATIVE NEGATIVE Final   C Diff interpretation No C. difficile detected.  Final    Comment: Performed at [facility], [address] [place], [place] [address]     Labs: BNP (last 3 results) Recent Labs    04/12/20 1420  BNP 505.3*   Basic Metabolic Panel: Recent Labs  Lab 04/12/20 1237 04/12/20 1420 04/13/20 0635 04/14/20 0534 04/15/20 0632  NA 139 140 141 140 142  K 4.1 3.8 3.6 3.4* 4.2  CL 108 105 105 103 108  CO2 23 24 26 25 25   GLUCOSE 100* 109* 98 74 79  BUN 14 14 20  29* 27*  CREATININE 1.31* 1.27* 1.71* 1.68* 1.38*  CALCIUM 8.6* 8.6* 8.6* 8.4* 8.5*  MG  --  2.2  --   --   --    Liver Function Tests: Recent Labs  Lab 04/12/20 1237 04/12/20 1420  AST 12* 14*  ALT 6 6  ALKPHOS 79 77  BILITOT 0.9 1.0   PROT 7.2 7.1  ALBUMIN 3.7 3.6   Recent Labs  Lab 04/12/20 1420  LIPASE 22   No results for input(s): AMMONIA in the last 168 hours. CBC: Recent Labs  Lab 04/12/20 1237 04/12/20 1420 04/13/20 0635 04/14/20 0534 04/15/20 0632  WBC 5.6 5.9 4.8  --  4.8  NEUTROABS 4.2 4.2  3.3  --   --   HGB 7.7* 7.3* 7.2* 8.0* 8.1*  HCT 28.7* 27.4* 26.7*  --  28.4*  MCV 64.6* 64.8* 64.6*  --  64.8*  PLT 350 350 319  --  294   Cardiac Enzymes: No results for input(s): CKTOTAL, CKMB, CKMBINDEX, TROPONINI in the last 168 hours. BNP: Invalid input(s): POCBNP CBG: Recent Labs  Lab 04/14/20 1135 04/14/20 1643 04/14/20 2107 04/15/20 0729 04/15/20 1144  GLUCAP 89 127* 159* 71 172*   D-Dimer No results for input(s): DDIMER in the last 72 hours. Hgb A1c No results for input(s): HGBA1C in the last 72 hours. Lipid Profile No results for input(s): CHOL, HDL, LDLCALC, TRIG, CHOLHDL, LDLDIRECT in the last 72 hours. Thyroid function studies No results for input(s): TSH, T4TOTAL, T3FREE, THYROIDAB in the last 72 hours.  Invalid input(s): FREET3 Anemia work up Recent Labs    04/12/20 1858  VITAMINB12 121*  FOLATE 9.4  FERRITIN 4*  TIBC 489*  IRON 17*  RETICCTPCT 2.5   Urinalysis    Component Value Date/Time   COLORURINE YELLOW (A) 04/12/2020 1419   APPEARANCEUR HAZY (A) 04/12/2020 1419   LABSPEC 1.016 04/12/2020 1419   PHURINE 7.0 04/12/2020 1419   GLUCOSEU NEGATIVE 04/12/2020 1419   HGBUR NEGATIVE 04/12/2020 1419   BILIRUBINUR NEGATIVE 04/12/2020 1419   KETONESUR NEGATIVE 04/12/2020 1419   PROTEINUR NEGATIVE 04/12/2020 1419   NITRITE NEGATIVE 04/12/2020 1419   LEUKOCYTESUR SMALL (A) 04/12/2020 1419   Sepsis Labs Invalid input(s): PROCALCITONIN,  WBC,  LACTICIDVEN Microbiology Recent Results (from the past 240 hour(s))  Blood culture (routine x 2)     Status: None (Preliminary result)   Collection Time: 04/12/20  4:18 PM   Specimen: BLOOD  Result Value Ref Range Status    Specimen Description BLOOD LEFT ANTECUBITAL  Final   Special Requests   Final    BOTTLES DRAWN AEROBIC AND ANAEROBIC Blood Culture adequate volume   Culture   Final    NO GROWTH 3 DAYS Performed at [facility], [address] [place], [place] [address]    Report Status PENDING  Incomplete  Blood culture (routine x 2)     Status: None (Preliminary result)   Collection Time: 04/12/20  4:18 PM   Specimen: BLOOD  Result Value Ref Range Status   Specimen Description BLOOD RIGHT ANTECUBITAL  Final   Special Requests   Final    BOTTLES DRAWN AEROBIC AND ANAEROBIC Blood Culture adequate volume   Culture   Final    NO GROWTH 3 DAYS Performed at [facility], [address], [place], [place] [address]    Report Status PENDING  Incomplete  SARS Coronavirus 2 by RT PCR (hospital order, performed in [facility] hospital lab) Nasopharyngeal Nasopharyngeal Swab     Status: None   Collection Time: 04/12/20  4:18 PM   Specimen: Nasopharyngeal Swab  Result Value Ref Range Status   SARS Coronavirus 2 NEGATIVE NEGATIVE Final    Comment: (NOTE) SARS-CoV-2 target nucleic acids are NOT DETECTED.  The SARS-CoV-2 RNA is generally detectable in upper and lower respiratory specimens during the acute phase of infection. The lowest concentration of SARS-CoV-2 viral copies this assay can detect is 250 copies / mL. A negative result does not preclude SARS-CoV-2 infection and should not be used as the sole basis for treatment or other patient management decisions.  A negative result may occur with improper specimen collection / handling, submission of specimen other than nasopharyngeal swab, presence  of viral mutation(s) within the areas targeted by this assay, and inadequate number of viral copies (<250 copies / mL). A negative result must be combined with clinical observations, patient history, and epidemiological information.  Fact Sheet for Patients:    StrictlyIdeas.no  Fact Sheet for Healthcare Providers: BankingDealers.co.za  This test is not yet approved or  cleared by the [place] FDA and has been authorized for detection and/or diagnosis of SARS-CoV-2 by FDA under an Emergency Use Authorization (EUA).  This EUA will remain in effect (meaning this test can be used) for the duration of the COVID-19 declaration under Section 564(b)(1) of the Act, 21 U.S.C. section 360bbb-3(b)(1), unless the authorization is terminated or revoked sooner.  Performed at [facility], [place], [place] [address]   C Difficile Quick Screen w PCR reflex     Status: None   Collection Time: 04/13/20  9:31 AM   Specimen: STOOL  Result Value Ref Range Status   C Diff antigen NEGATIVE NEGATIVE Final   C Diff toxin NEGATIVE NEGATIVE Final   C Diff interpretation No C. difficile detected.  Final    Comment: Performed at [facility], [address], [place], [place] [address]     Time coordinating discharge: Over 30 minutes  SIGNED:   Wyvonnia Dusky, MD  [facility] 04/15/2020, 2:27 PM Pager   If 7PM-7AM, please contact night-coverage [email or web address]

## 2020-04-15 NOTE — Evaluation (Signed)
Occupational Therapy Evaluation Patient Details Name: Carly Cortez MRN: [id number] DOB: 12/23/1940 Today's Date: 04/15/2020    History of Present Illness presented to ER secondary to SOB, chest pain; admitted for management of acute/chronic CHF.   Clinical Impression   Carly Cortez was seen for OT evaluation this date. Pt reports she was independent in all ADL and functional mobility PTA. She lives with her spouse in a 1 story mobile home and does not use supplemental O2 at baseline. Pt reports becoming easily fatigued or out of breath with minimal exertion recently. Pt currently requires MIN A for exertional ADL management including LB bathing and dressing due to current functional impairments (See OT Problem List below). Pt educated in energy conservation strategies including pursed lip breathing, activity pacing, home/routines modifications, work simplification, AE/DME, prioritizing of meaningful occupations, and falls prevention. Pt verbalized understanding and would benefit from additional skilled OT services to maximize recall and carryover of learned techniques and facilitate implementation of learned techniques into daily routines. Upon discharge, recommend [place] services.       Follow Up Recommendations  Home health OT    Equipment Recommendations  3 in 1 bedside commode    Recommendations for Other Services       Precautions / Restrictions Precautions Precautions: Fall Restrictions Weight Bearing Restrictions: No      Mobility Bed Mobility               General bed mobility comments: Deferred. Pt declines OOB/EOB activity 2/2 recent episodes of urinary incontinence. Agreeable to bed level only this date.  Transfers                      Balance Overall balance assessment: Needs assistance Sitting-balance support: No upper extremity supported;Feet supported Sitting balance-Leahy Scale: Good Sitting balance - Comments: Pt able to re-position in bed w/o  assist. Comes to long sitting w/o back support.                                   ADL either performed or assessed with clinical judgement   ADL Overall ADL's : Needs assistance/impaired                                       General ADL Comments: SUP to MIN A for LB ADL management/functional mobility 2/2 increased SOB/fatigue with exertion. Pt on 1L [place] during evaluation, no O2 use in the home. Pt educated on ECS, and would benefit from opportunity to implement during ADL management.     Vision Baseline Vision/History: Wears glasses Wears Glasses: At all times Patient Visual Report: No change from baseline       Perception     Praxis      Pertinent Vitals/Pain Pain Assessment: No/denies pain     Hand Dominance     Extremity/Trunk Assessment Upper Extremity Assessment Upper Extremity Assessment: Overall WFL for tasks assessed (Grossly symmetrical, 4/5 WFL for AROM, grip, FMC.)   Lower Extremity Assessment Lower Extremity Assessment: Overall WFL for tasks assessed   Cervical / Trunk Assessment Cervical / Trunk Assessment: Normal   Communication Communication Communication: No difficulties   Cognition Arousal/Alertness: Awake/alert Behavior During Therapy: WFL for tasks assessed/performed Overall Cognitive Status: Within Functional Limits for tasks assessed  General Comments  Pt on 1 L [place] during session, vitals monitored t/o session, pt notably SOB with minimal exertion but spO2 remains WFL >94% t/o session.    Exercises Other Exercises Other Exercises: Pt educated on safe use of AE/ DME for ADL management, falls prevention strategies, and energy conservation including pursed lip breathing and activity pacing.   Shoulder Instructions      Home Living Family/patient expects to be discharged to:: Private residence Living Arrangements: Spouse/significant other Available Help at  Discharge: Family Type of Home: Mobile home Home Access: Stairs to enter Entrance Stairs-Number of Steps: 4 Entrance Stairs-Rails: Right;Left;Can reach both Home Layout: One level     Bathroom Shower/Tub: Walk-in shower;Other (comment) (Garden Tub)   Bathroom Toilet: Handicapped height     Home Equipment: Walker - 2 wheels          Prior Functioning/Environment Level of Independence: Independent        Comments: Indep for ADLs, household and community mobilization without assist device; no home O2.  Does endorse at least 1-2 falls in previous six months (one resulting in R wrist fracture, now repaired/clear)        OT Problem List: Decreased strength;Decreased coordination;Cardiopulmonary status limiting activity;Decreased activity tolerance;Decreased safety awareness;Impaired balance (sitting and/or standing);Decreased knowledge of use of DME or AE      OT Treatment/Interventions: Self-care/ADL training;Therapeutic exercise;Therapeutic activities;DME and/or AE instruction;Patient/family education;Balance training;Energy conservation    OT Goals(Current goals can be found in the care plan section) Acute Rehab OT Goals Patient Stated Goal: to get back home when I'm able OT Goal Formulation: With patient Time For Goal Achievement: 04/29/20 Potential to Achieve Goals: Good ADL Goals Pt Will Perform Grooming: with modified independence;with set-up;sitting Pt Will Perform Lower Body Dressing: sit to/from stand;with set-up;with supervision;with caregiver independent in assisting;with adaptive equipment (c LRAD PRN for improved safety and functional independence.) Pt Will Transfer to Toilet: ambulating;bedside commode;with set-up;with supervision (c LRAD PRN for improved safety and functional independence.) Pt Will Perform Toileting - Clothing Manipulation and hygiene: sit to/from stand;with supervision;with set-up;with caregiver independent in assisting;with adaptive equipment (c  LRAD PRN for improved safety and functional independence.)  OT Frequency: Min 1X/week   Barriers to D/C:            Co-evaluation              AM-PAC OT "6 Clicks" Daily Activity     Outcome Measure Help from another person eating meals?: A Little Help from another person taking care of personal grooming?: A Little Help from another person toileting, which includes using toliet, bedpan, or urinal?: A Little Help from another person bathing (including washing, rinsing, drying)?: A Little Help from another person to put on and taking off regular upper body clothing?: A Little Help from another person to put on and taking off regular lower body clothing?: A Little 6 Click Score: 18   End of Session    Activity Tolerance: Patient tolerated treatment well Patient left: in bed;with call bell/phone within reach;with nursing/sitter in room  OT Visit Diagnosis: Other abnormalities of gait and mobility (R26.89);Muscle weakness (generalized) (M62.81)                Time: 3734-2876 OT Time Calculation (min): 23 min Charges:  OT General Charges $OT Visit: 1 Visit OT Evaluation $OT Eval Moderate Complexity: 1 Mod OT Treatments $Self Care/Home Management : 8-22 mins  Shara Blazing, M.S., OTR/L Ascom: [phone] 04/15/20, 1:34 PM

## 2020-04-17 LAB — CULTURE, BLOOD (ROUTINE X 2)
Culture: NO GROWTH
Culture: NO GROWTH
Special Requests: ADEQUATE
Special Requests: ADEQUATE

## 2020-04-21 ENCOUNTER — Telehealth: Payer: Self-pay | Admitting: Family

## 2020-04-23 ENCOUNTER — Ambulatory Visit: Payer: Medicare Other | Admitting: Family

## 2021-04-23 IMAGING — DX DG CHEST 1V PORT
1 series · 1 of 1 positions shown · non-contrast
Comparison: 07/18/2016

CLINICAL DATA: Fever and shortness of breath.

EXAM:
PORTABLE CHEST 1 VIEW

[chest ap]
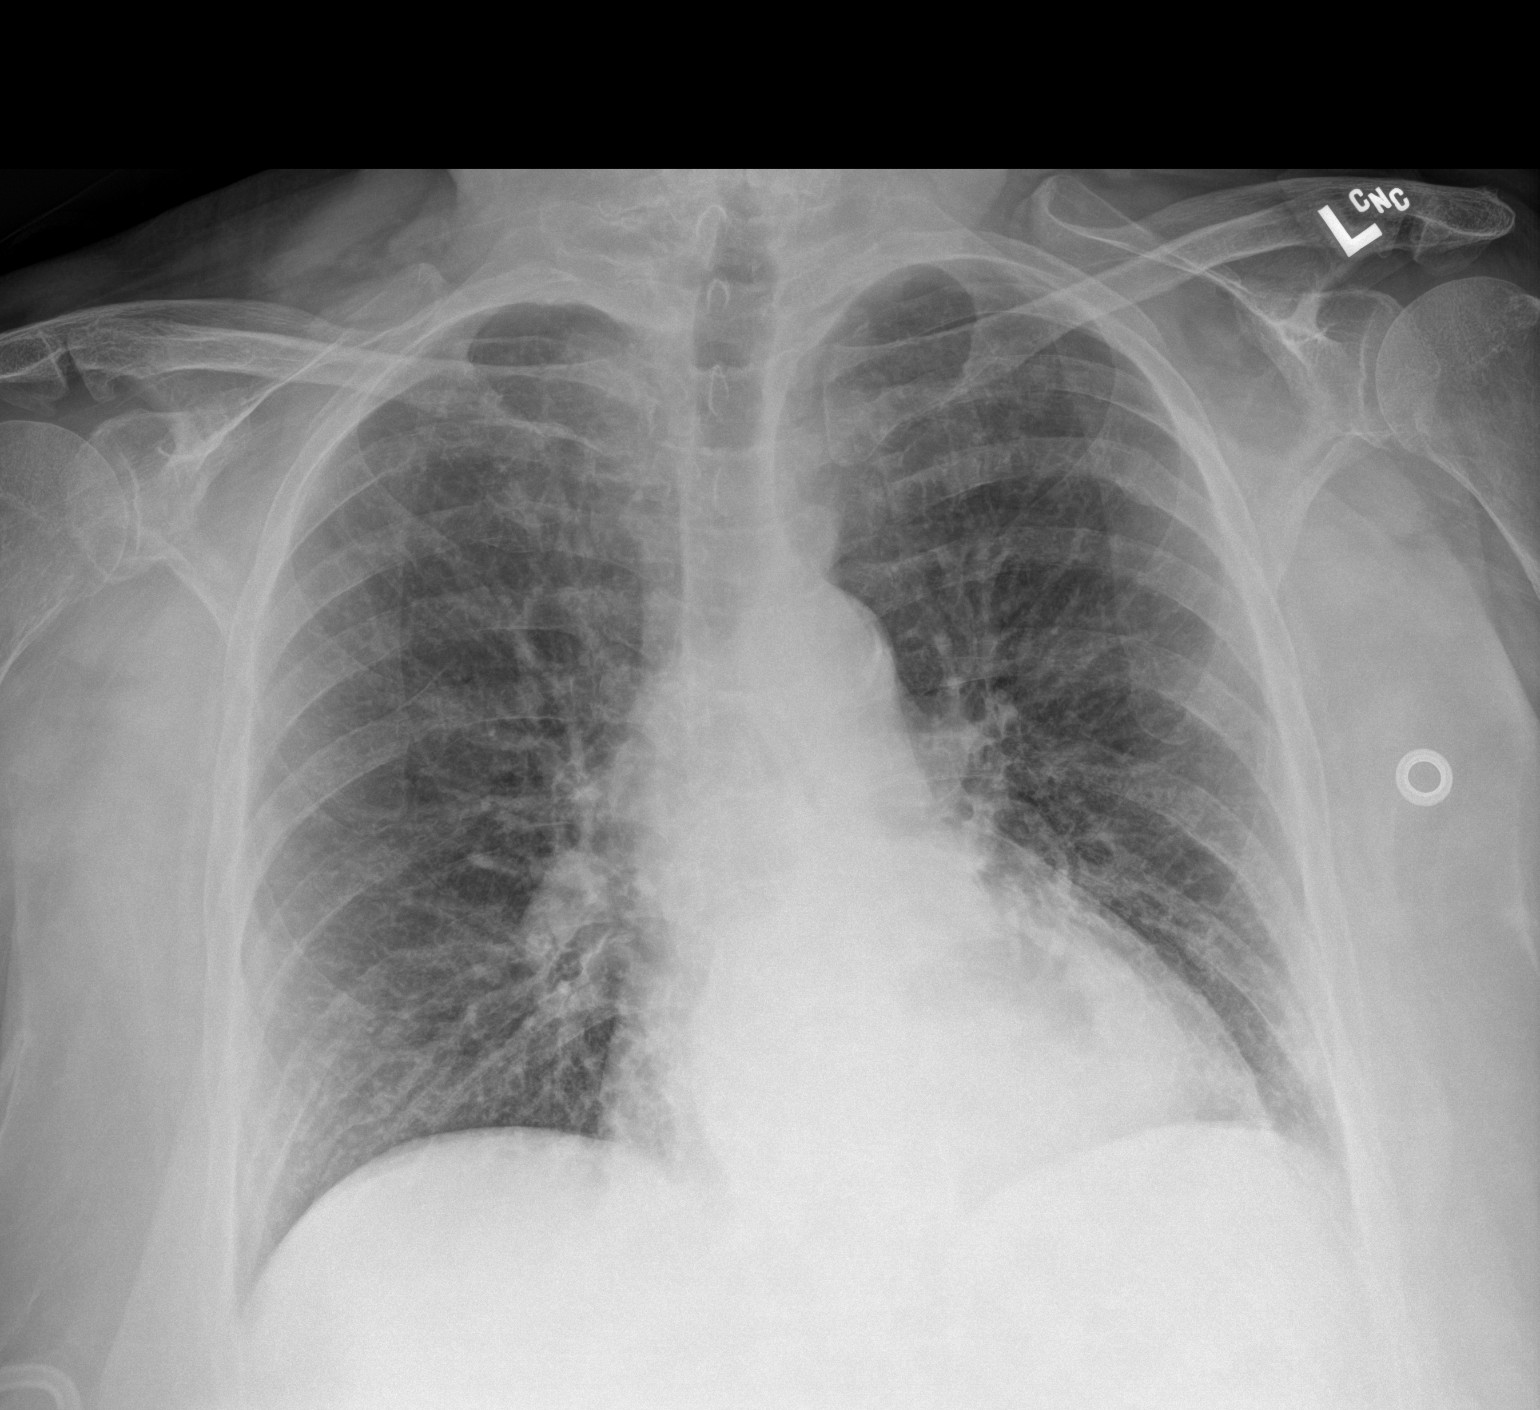

[1 of 1 positions shown; findings below may reference images not displayed]

FINDINGS: The heart size is normal. Aortic atherosclerosis. There is no
pericardial effusion identified. Large hiatal hernia. No
superimposed airspace consolidation.
IMPRESSION: 1. Lungs are clear.
2. Hiatal hernia
3.  Aortic Atherosclerosis ([id number]-[id number]).

## 2021-10-07 IMAGING — CT CT ABD-PELV W/O CM
2 of 5 series · 12 of 36 positions shown, 15 images · non-contrast
Comparison: Chest radiograph from earlier today. 11/04/2019 CT
abdomen/pelvis.

CLINICAL DATA: Cough. Sinus congestion and drainage for 2 weeks
refractory to antibiotic therapy. Left chest discomfort. Anemia.
Hypoxia. Acute abdominal pain.

EXAM:
CT CHEST, ABDOMEN AND PELVIS WITHOUT CONTRAST
TECHNIQUE: Multidetector CT imaging of the chest, abdomen and pelvis was
performed following the standard protocol without IV contrast.

[Series 3: thins · axial · 0.91mm/px · z∈[-548,+12]mm · 9 of 1359 slices shown, 12 images]
[im 119/1359  mediastinal]
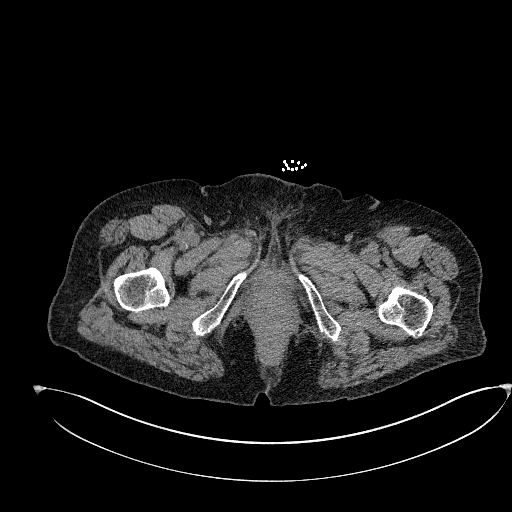
[im 119/1359  lung]
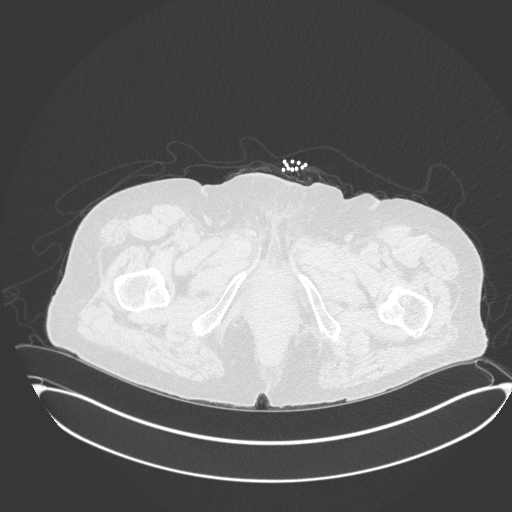
[im 237/1359  lung]
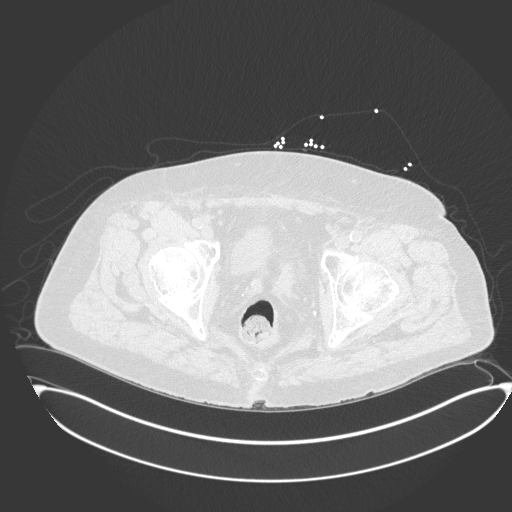
[im 414/1359  lung]
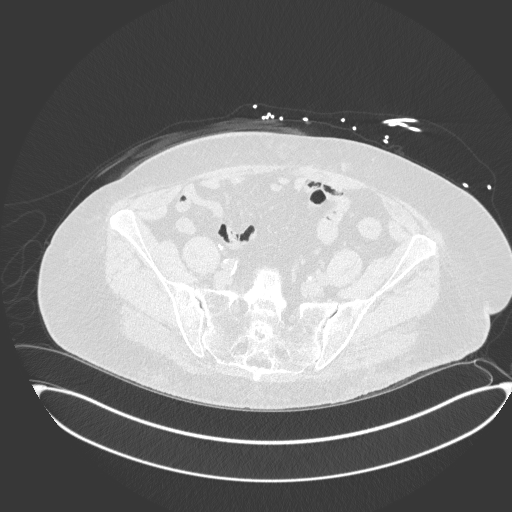
[im 532/1359  lung]
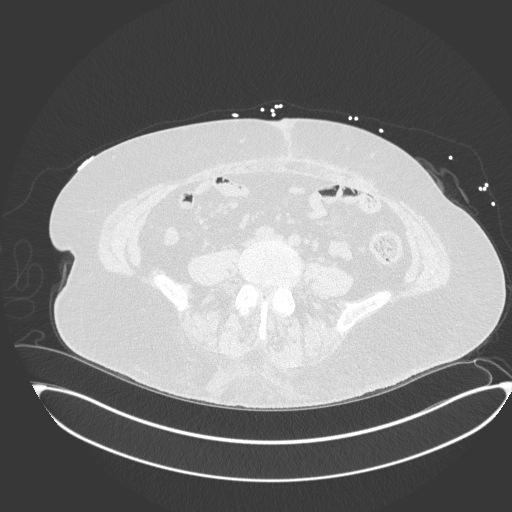
[im 709/1359  mediastinal]
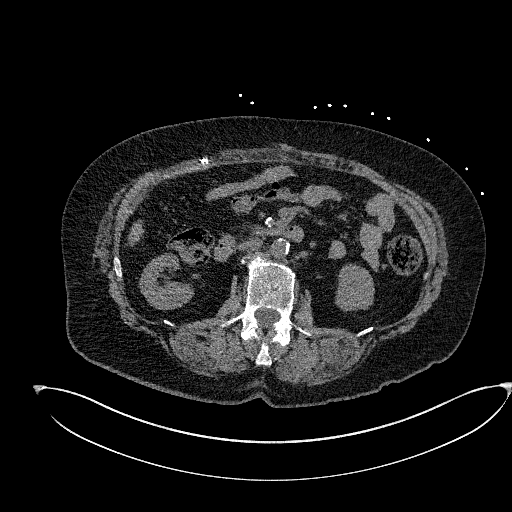
[im 709/1359  lung]
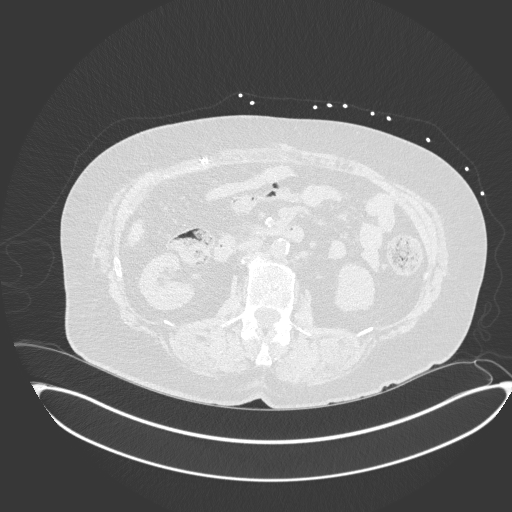
[im 827/1359  lung]
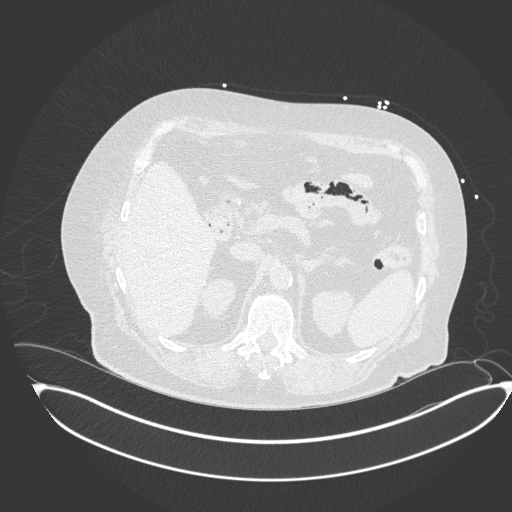
[im 945/1359  lung]
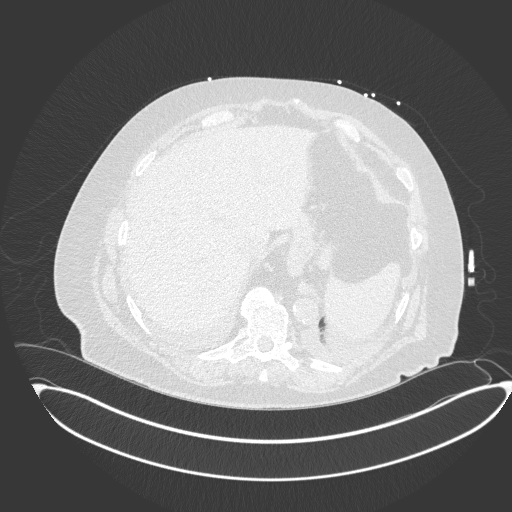
[im 1122/1359  lung]
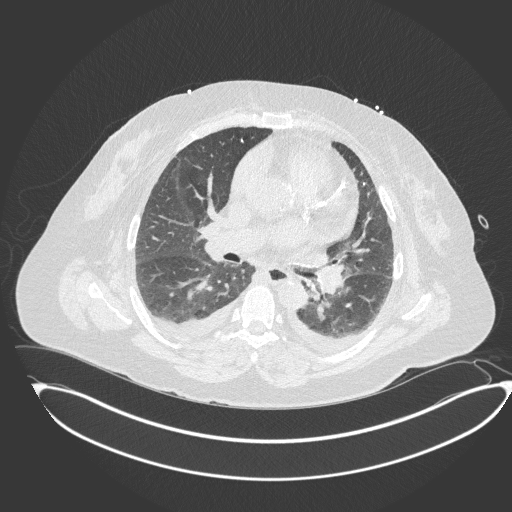
[im 1240/1359  mediastinal]
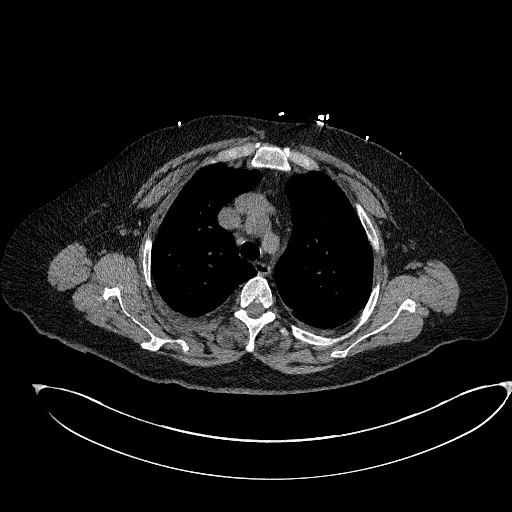
[im 1240/1359  lung]
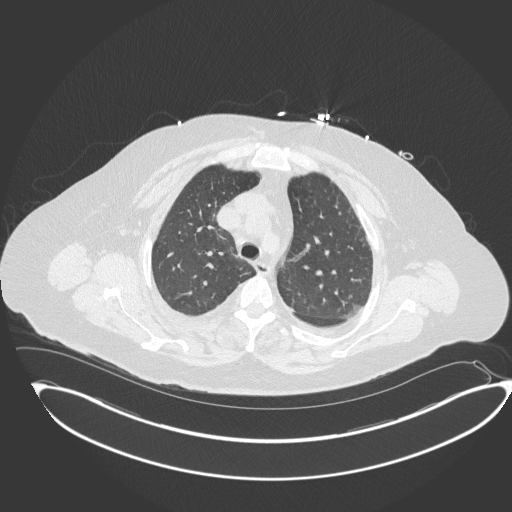

[Series 5: coronal · coronal · 0.78mm/px · 3 of 142 slices shown]
[im 29/142  lung]
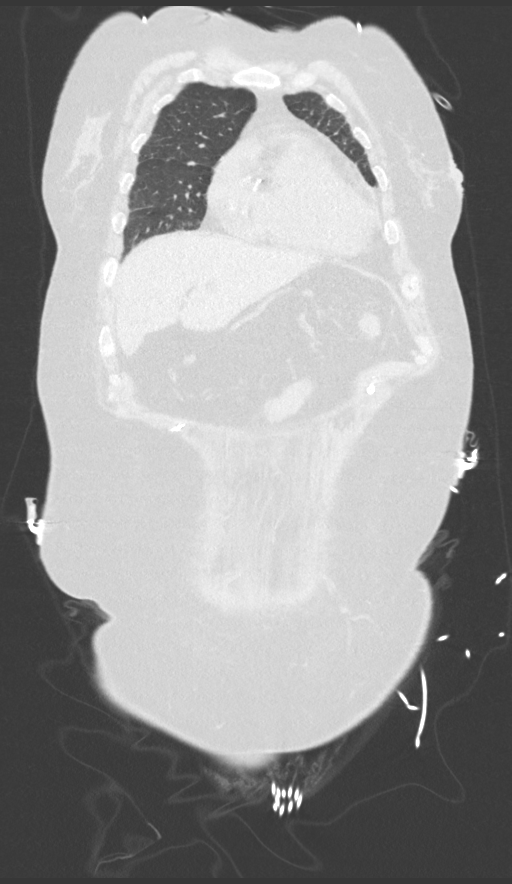
[im 57/142  lung]
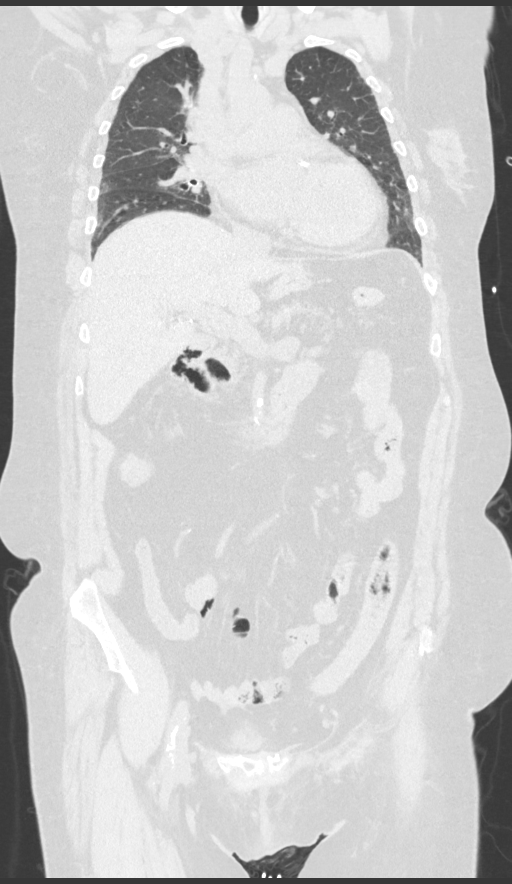
[im 85/142  lung]
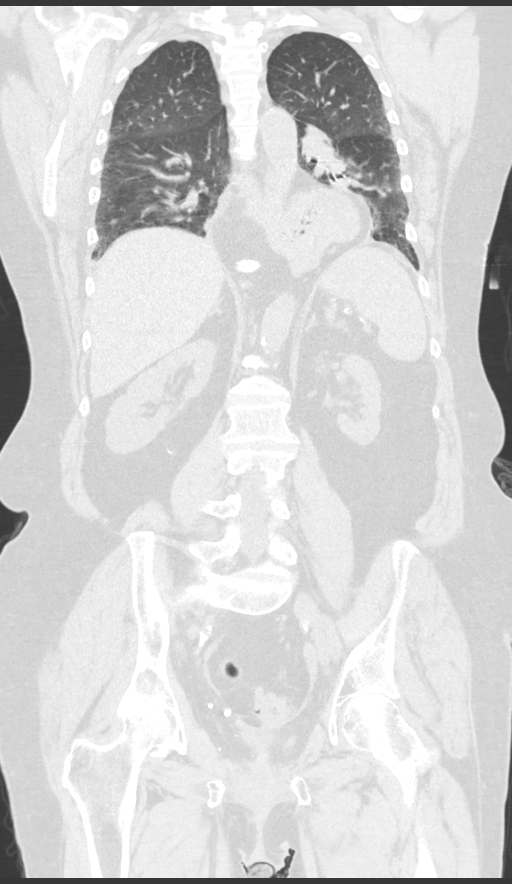

[12 of 36 positions shown; findings below may reference images not displayed]

FINDINGS: CT CHEST FINDINGS

Cardiovascular: Borderline mild cardiomegaly. Trace pericardial
effusion/thickening. Three-vessel coronary atherosclerosis.
Atherosclerotic nonaneurysmal thoracic aorta. Top-normal caliber
main pulmonary artery (3.1 cm diameter).

Mediastinum/Nodes: No discrete thyroid nodules. Unremarkable
esophagus. No pathologically enlarged axillary, mediastinal or hilar
lymph nodes, noting limited sensitivity for the detection of hilar
adenopathy on this noncontrast study.

Lungs/Pleura: No pneumothorax. Small dependent bilateral pleural
effusions. Mild dependent passive atelectasis in the lower lobes
bilaterally. Compressive atelectasis in the medial left lower lobe
from the large hiatal hernia. Mild diffuse interlobular septal
thickening. Basilar right lower lobe 7 mm solid pulmonary nodule
(series 4/image 90) is stable since 12/10/2018 CT and considered
benign. No acute consolidative airspace disease, lung masses or new
significant pulmonary nodules.

Musculoskeletal: No aggressive appearing focal osseous lesions.
Moderate lower thoracic spondylosis.

CT ABDOMEN PELVIS FINDINGS

Hepatobiliary: Normal liver with no liver mass. Cholecystectomy. No
biliary ductal dilatation.

Pancreas: Normal, with no mass or duct dilation.

Spleen: Normal size. No mass.

Adrenals/Urinary Tract: Normal adrenals. No hydronephrosis. No renal
stones. No contour deforming renal masses. Normal bladder.

Stomach/Bowel: Large hiatal hernia. Stomach is nondistended and
otherwise normal. Postsurgical changes from subtotal right
hemicolectomy with ileocolic anastomosis in the right abdomen. No
dilated or thick-walled small bowel loops. Moderate sigmoid
diverticulosis. Chronic segmental mild mid sigmoid wall thickening
is not substantially changed. No significant acute pericolonic fat
stranding. No new large bowel wall thickening.

Vascular/Lymphatic: Atherosclerotic nonaneurysmal abdominal aorta.
No pathologically enlarged lymph nodes in the abdomen or pelvis.

Reproductive: Status post hysterectomy, with no abnormal findings at
the vaginal cuff. No adnexal mass.

Other: No pneumoperitoneum, ascites or focal fluid collection.

Musculoskeletal: No aggressive appearing focal osseous lesions. Mild
lumbar spondylosis.
IMPRESSION: 1. Borderline mild cardiomegaly. Trace pericardial
effusion/thickening. Small dependent bilateral pleural effusions.
Mild diffuse interlobular septal thickening, suggesting mild
pulmonary edema. These findings suggest mild congestive heart
failure.
2. Three-vessel coronary atherosclerosis.
3. Large hiatal hernia.
4. Evidence of chronic moderate sigmoid diverticulosis. No evidence
of acute diverticulitis. No evidence of bowel obstruction or acute
bowel inflammation.
5. Aortic Atherosclerosis ([id number]).

## 2021-10-07 IMAGING — CT CT CHEST W/O CM
2 of 4 series · 12 of 36 positions shown, 15 images · non-contrast
Comparison: Chest radiograph from earlier today. 11/04/2019 CT
abdomen/pelvis.

CLINICAL DATA: Cough. Sinus congestion and drainage for 2 weeks
refractory to antibiotic therapy. Left chest discomfort. Anemia.
Hypoxia. Acute abdominal pain.

EXAM:
CT CHEST, ABDOMEN AND PELVIS WITHOUT CONTRAST
TECHNIQUE: Multidetector CT imaging of the chest, abdomen and pelvis was
performed following the standard protocol without IV contrast.

[Series 2: axial st · axial · 0.91mm/px · z∈[-558,+22]mm · 9 of 136 slices shown, 12 images]
[im 10/136  mediastinal]
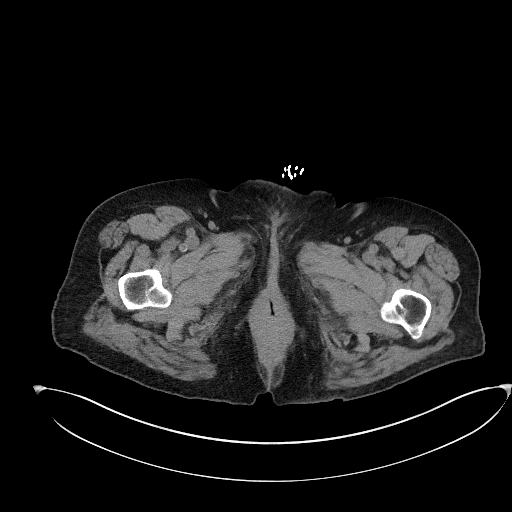
[im 10/136  lung]
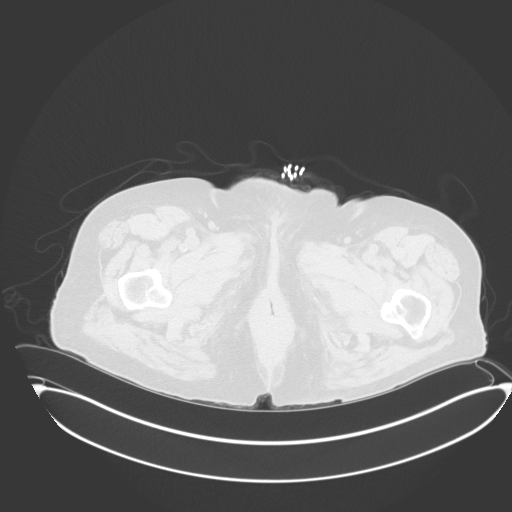
[im 29/136  lung]
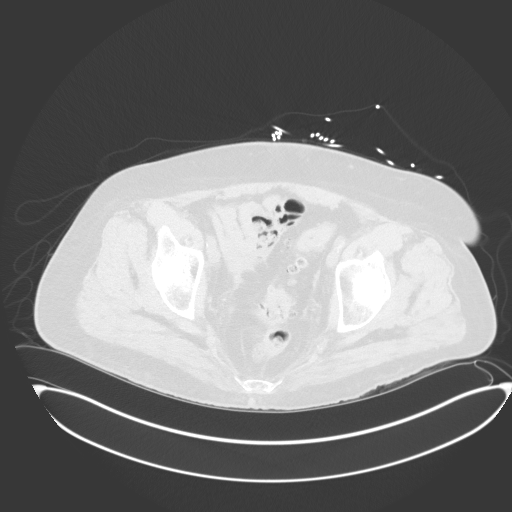
[im 39/136  lung]
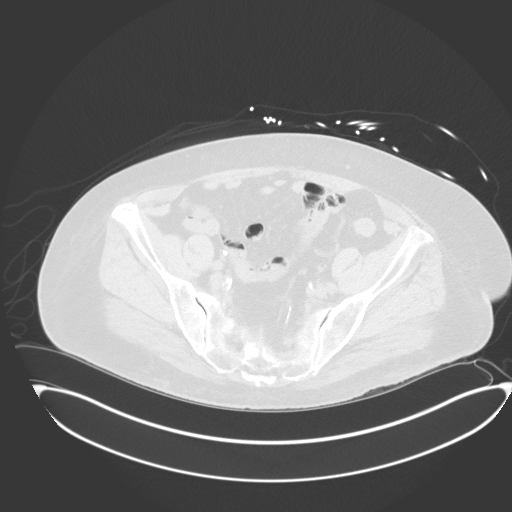
[im 58/136  lung]
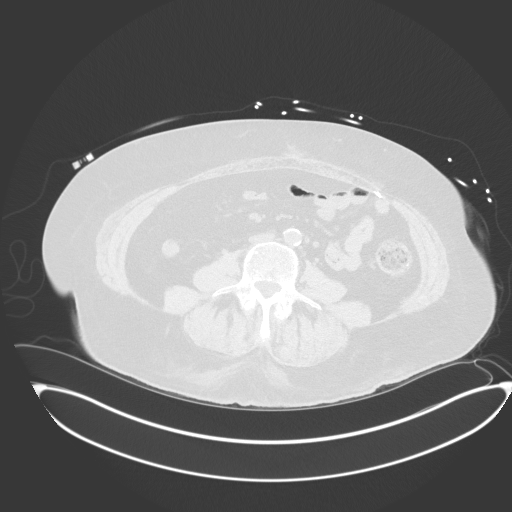
[im 68/136  mediastinal]
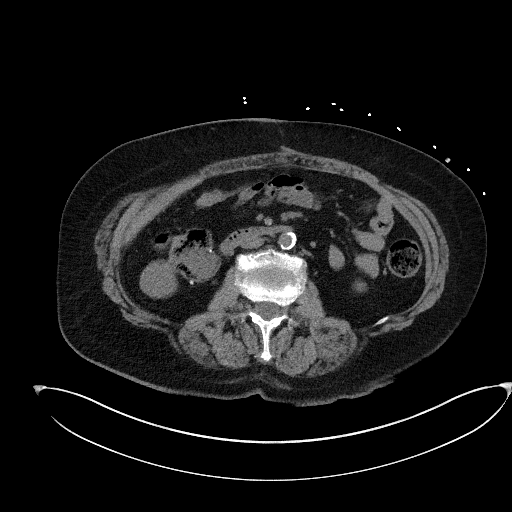
[im 68/136  lung]
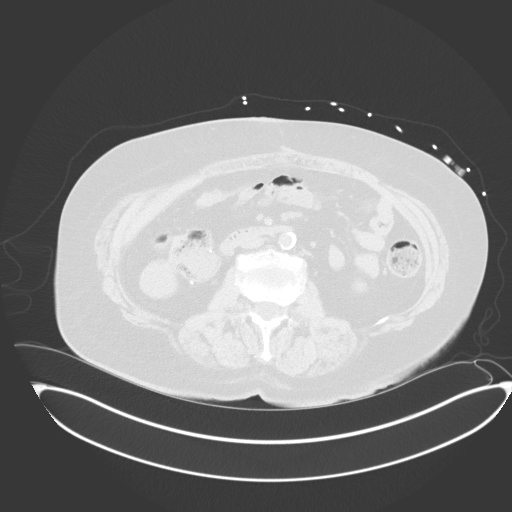
[im 78/136  lung]
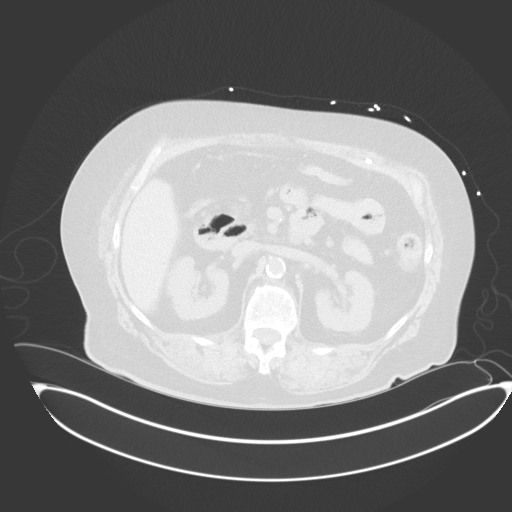
[im 97/136  lung]
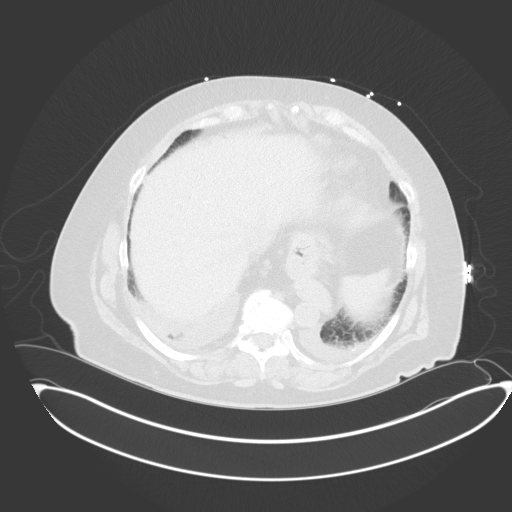
[im 107/136  lung]
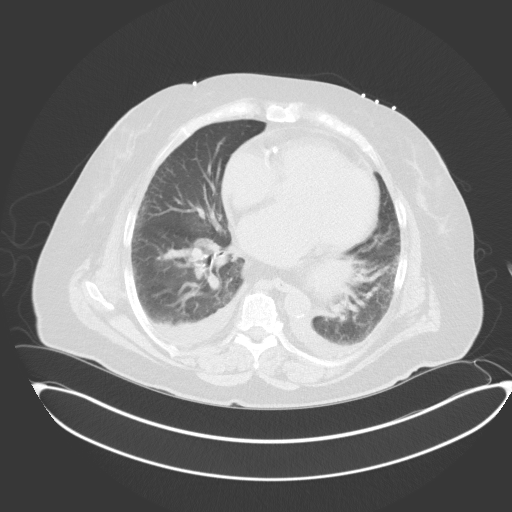
[im 126/136  mediastinal]
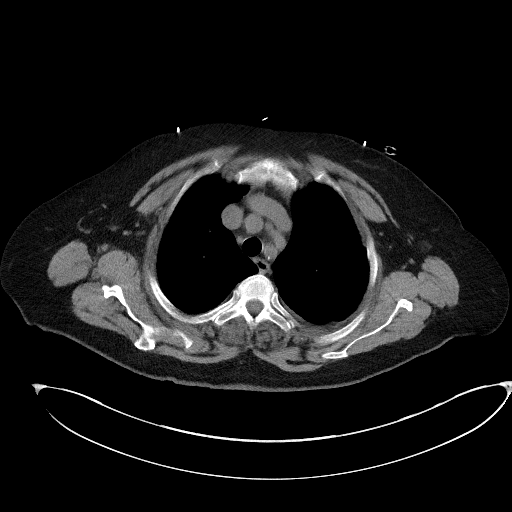
[im 126/136  lung]
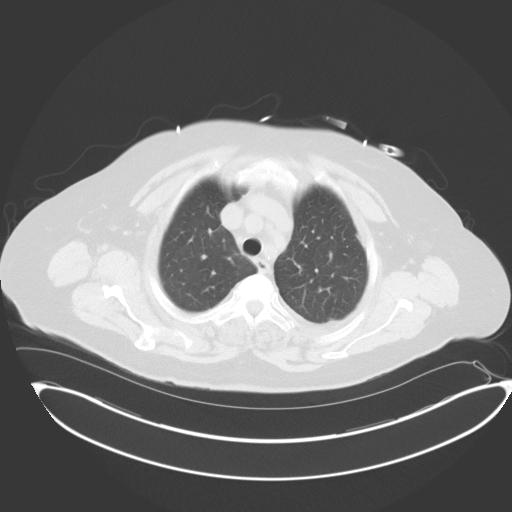

[Series 5: coronal · coronal · 0.78mm/px · 3 of 142 slices shown]
[im 29/142  lung]
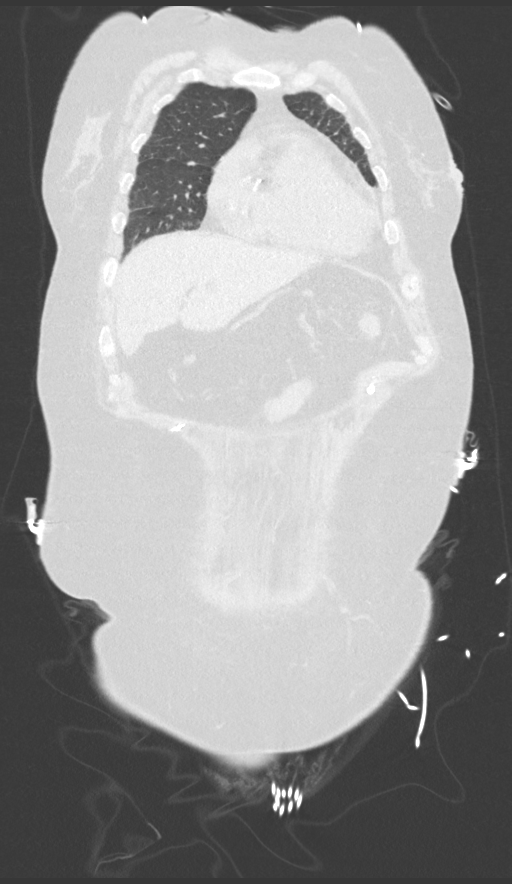
[im 57/142  lung]
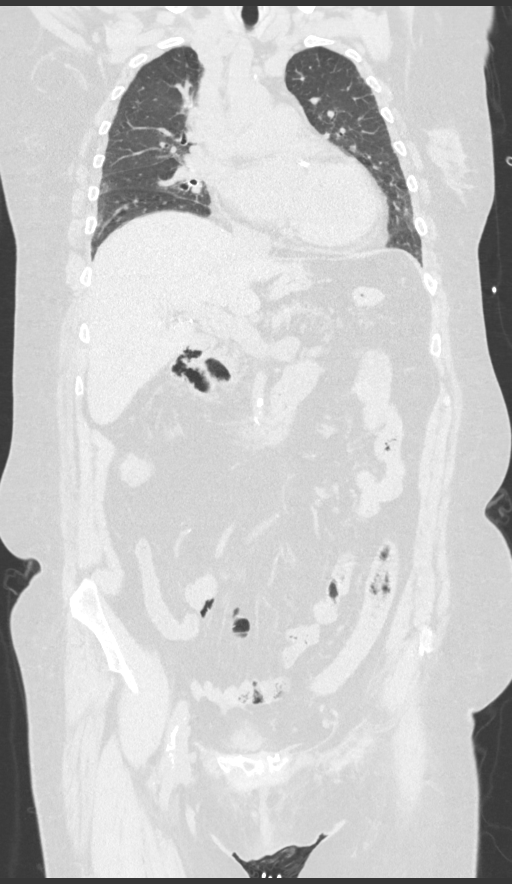
[im 85/142  lung]
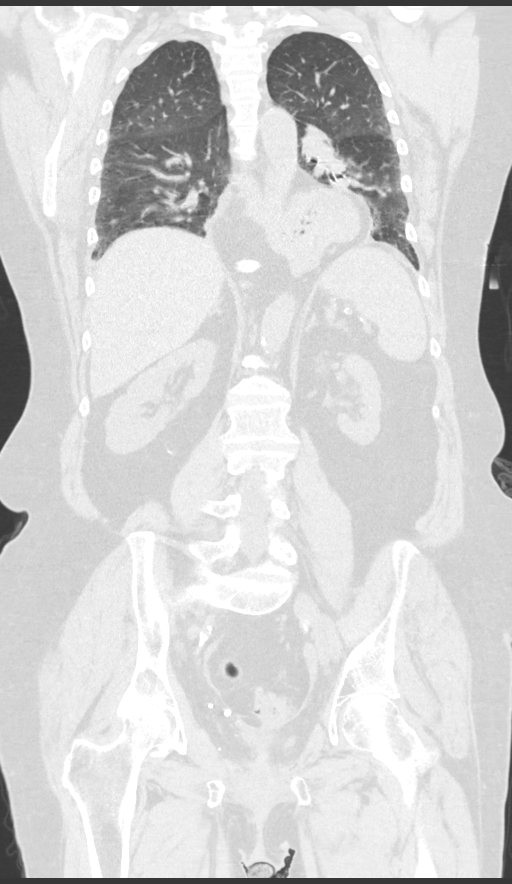

[12 of 36 positions shown; findings below may reference images not displayed]

FINDINGS: CT CHEST FINDINGS

Cardiovascular: Borderline mild cardiomegaly. Trace pericardial
effusion/thickening. Three-vessel coronary atherosclerosis.
Atherosclerotic nonaneurysmal thoracic aorta. Top-normal caliber
main pulmonary artery (3.1 cm diameter).

Mediastinum/Nodes: No discrete thyroid nodules. Unremarkable
esophagus. No pathologically enlarged axillary, mediastinal or hilar
lymph nodes, noting limited sensitivity for the detection of hilar
adenopathy on this noncontrast study.

Lungs/Pleura: No pneumothorax. Small dependent bilateral pleural
effusions. Mild dependent passive atelectasis in the lower lobes
bilaterally. Compressive atelectasis in the medial left lower lobe
from the large hiatal hernia. Mild diffuse interlobular septal
thickening. Basilar right lower lobe 7 mm solid pulmonary nodule
(series 4/image 90) is stable since 12/10/2018 CT and considered
benign. No acute consolidative airspace disease, lung masses or new
significant pulmonary nodules.

Musculoskeletal: No aggressive appearing focal osseous lesions.
Moderate lower thoracic spondylosis.

CT ABDOMEN PELVIS FINDINGS

Hepatobiliary: Normal liver with no liver mass. Cholecystectomy. No
biliary ductal dilatation.

Pancreas: Normal, with no mass or duct dilation.

Spleen: Normal size. No mass.

Adrenals/Urinary Tract: Normal adrenals. No hydronephrosis. No renal
stones. No contour deforming renal masses. Normal bladder.

Stomach/Bowel: Large hiatal hernia. Stomach is nondistended and
otherwise normal. Postsurgical changes from subtotal right
hemicolectomy with ileocolic anastomosis in the right abdomen. No
dilated or thick-walled small bowel loops. Moderate sigmoid
diverticulosis. Chronic segmental mild mid sigmoid wall thickening
is not substantially changed. No significant acute pericolonic fat
stranding. No new large bowel wall thickening.

Vascular/Lymphatic: Atherosclerotic nonaneurysmal abdominal aorta.
No pathologically enlarged lymph nodes in the abdomen or pelvis.

Reproductive: Status post hysterectomy, with no abnormal findings at
the vaginal cuff. No adnexal mass.

Other: No pneumoperitoneum, ascites or focal fluid collection.

Musculoskeletal: No aggressive appearing focal osseous lesions. Mild
lumbar spondylosis.
IMPRESSION: 1. Borderline mild cardiomegaly. Trace pericardial
effusion/thickening. Small dependent bilateral pleural effusions.
Mild diffuse interlobular septal thickening, suggesting mild
pulmonary edema. These findings suggest mild congestive heart
failure.
2. Three-vessel coronary atherosclerosis.
3. Large hiatal hernia.
4. Evidence of chronic moderate sigmoid diverticulosis. No evidence
of acute diverticulitis. No evidence of bowel obstruction or acute
bowel inflammation.
5. Aortic Atherosclerosis ([id number]).

## 2022-07-13 NOTE — Telephone Encounter (Signed)
Error

## 2022-08-14 ENCOUNTER — Inpatient Hospital Stay
Admission: EM | Admit: 2022-08-14 | Discharge: 2022-08-17 | DRG: 690 | Disposition: A | Discharge status: Home / Self Care | Payer: Medicare Other | Attending: Internal Medicine | Admitting: Internal Medicine

## 2022-08-14 ENCOUNTER — Emergency Department: Payer: Medicare Other

## 2022-08-14 ENCOUNTER — Encounter: Payer: Self-pay | Admitting: Pharmacy Technician

## 2022-08-14 ENCOUNTER — Other Ambulatory Visit: Payer: Self-pay

## 2022-08-14 DIAGNOSIS — Z66 Do not resuscitate: Secondary | ICD-10-CM | POA: Diagnosis present

## 2022-08-14 DIAGNOSIS — Z794 Long term (current) use of insulin: Secondary | ICD-10-CM | POA: Diagnosis not present

## 2022-08-14 DIAGNOSIS — Z87440 Personal history of urinary (tract) infections: Secondary | ICD-10-CM

## 2022-08-14 DIAGNOSIS — Z791 Long term (current) use of non-steroidal anti-inflammatories (NSAID): Secondary | ICD-10-CM

## 2022-08-14 DIAGNOSIS — K449 Diaphragmatic hernia without obstruction or gangrene: Secondary | ICD-10-CM | POA: Diagnosis present

## 2022-08-14 DIAGNOSIS — R55 Syncope and collapse: Secondary | ICD-10-CM | POA: Diagnosis present

## 2022-08-14 DIAGNOSIS — E872 Acidosis, unspecified: Secondary | ICD-10-CM | POA: Diagnosis present

## 2022-08-14 DIAGNOSIS — Z888 Allergy status to other drugs, medicaments and biological substances status: Secondary | ICD-10-CM

## 2022-08-14 DIAGNOSIS — I951 Orthostatic hypotension: Secondary | ICD-10-CM | POA: Diagnosis present

## 2022-08-14 DIAGNOSIS — Z1152 Encounter for screening for COVID-19: Secondary | ICD-10-CM | POA: Diagnosis not present

## 2022-08-14 DIAGNOSIS — I5032 Chronic diastolic (congestive) heart failure: Secondary | ICD-10-CM | POA: Diagnosis present

## 2022-08-14 DIAGNOSIS — B9620 Unspecified Escherichia coli [E. coli] as the cause of diseases classified elsewhere: Secondary | ICD-10-CM | POA: Diagnosis present

## 2022-08-14 DIAGNOSIS — D509 Iron deficiency anemia, unspecified: Secondary | ICD-10-CM | POA: Diagnosis present

## 2022-08-14 DIAGNOSIS — I9589 Other hypotension: Secondary | ICD-10-CM | POA: Diagnosis not present

## 2022-08-14 DIAGNOSIS — R079 Chest pain, unspecified: Secondary | ICD-10-CM

## 2022-08-14 DIAGNOSIS — I10 Essential (primary) hypertension: Secondary | ICD-10-CM | POA: Diagnosis not present

## 2022-08-14 DIAGNOSIS — K529 Noninfective gastroenteritis and colitis, unspecified: Secondary | ICD-10-CM | POA: Diagnosis present

## 2022-08-14 DIAGNOSIS — E876 Hypokalemia: Secondary | ICD-10-CM | POA: Diagnosis present

## 2022-08-14 DIAGNOSIS — E1122 Type 2 diabetes mellitus with diabetic chronic kidney disease: Secondary | ICD-10-CM | POA: Diagnosis present

## 2022-08-14 DIAGNOSIS — A419 Sepsis, unspecified organism: Secondary | ICD-10-CM

## 2022-08-14 DIAGNOSIS — Z91041 Radiographic dye allergy status: Secondary | ICD-10-CM

## 2022-08-14 DIAGNOSIS — E7800 Pure hypercholesterolemia, unspecified: Secondary | ICD-10-CM | POA: Diagnosis present

## 2022-08-14 DIAGNOSIS — R6520 Severe sepsis without septic shock: Secondary | ICD-10-CM | POA: Diagnosis not present

## 2022-08-14 DIAGNOSIS — Z85038 Personal history of other malignant neoplasm of large intestine: Secondary | ICD-10-CM | POA: Diagnosis not present

## 2022-08-14 DIAGNOSIS — Z7982 Long term (current) use of aspirin: Secondary | ICD-10-CM

## 2022-08-14 DIAGNOSIS — I130 Hypertensive heart and chronic kidney disease with heart failure and stage 1 through stage 4 chronic kidney disease, or unspecified chronic kidney disease: Secondary | ICD-10-CM | POA: Diagnosis present

## 2022-08-14 DIAGNOSIS — E860 Dehydration: Secondary | ICD-10-CM | POA: Diagnosis present

## 2022-08-14 DIAGNOSIS — Z9049 Acquired absence of other specified parts of digestive tract: Secondary | ICD-10-CM

## 2022-08-14 DIAGNOSIS — Z833 Family history of diabetes mellitus: Secondary | ICD-10-CM

## 2022-08-14 DIAGNOSIS — A4151 Sepsis due to Escherichia coli [E. coli]: Principal | ICD-10-CM | POA: Diagnosis present

## 2022-08-14 DIAGNOSIS — J069 Acute upper respiratory infection, unspecified: Secondary | ICD-10-CM | POA: Diagnosis present

## 2022-08-14 DIAGNOSIS — N390 Urinary tract infection, site not specified: Principal | ICD-10-CM | POA: Diagnosis present

## 2022-08-14 DIAGNOSIS — N1831 Chronic kidney disease, stage 3a: Secondary | ICD-10-CM | POA: Diagnosis present

## 2022-08-14 DIAGNOSIS — N179 Acute kidney failure, unspecified: Secondary | ICD-10-CM | POA: Diagnosis present

## 2022-08-14 DIAGNOSIS — Z8544 Personal history of malignant neoplasm of other female genital organs: Secondary | ICD-10-CM

## 2022-08-14 DIAGNOSIS — Z955 Presence of coronary angioplasty implant and graft: Secondary | ICD-10-CM | POA: Diagnosis not present

## 2022-08-14 DIAGNOSIS — Z79899 Other long term (current) drug therapy: Secondary | ICD-10-CM

## 2022-08-14 DIAGNOSIS — Z7902 Long term (current) use of antithrombotics/antiplatelets: Secondary | ICD-10-CM

## 2022-08-14 DIAGNOSIS — Z8249 Family history of ischemic heart disease and other diseases of the circulatory system: Secondary | ICD-10-CM

## 2022-08-14 DIAGNOSIS — E1165 Type 2 diabetes mellitus with hyperglycemia: Secondary | ICD-10-CM | POA: Diagnosis present

## 2022-08-14 DIAGNOSIS — I959 Hypotension, unspecified: Principal | ICD-10-CM

## 2022-08-14 DIAGNOSIS — I2510 Atherosclerotic heart disease of native coronary artery without angina pectoris: Secondary | ICD-10-CM | POA: Diagnosis present

## 2022-08-14 DIAGNOSIS — M109 Gout, unspecified: Secondary | ICD-10-CM | POA: Diagnosis present

## 2022-08-14 DIAGNOSIS — Z79818 Long term (current) use of other agents affecting estrogen receptors and estrogen levels: Secondary | ICD-10-CM

## 2022-08-14 DIAGNOSIS — Z923 Personal history of irradiation: Secondary | ICD-10-CM

## 2022-08-14 LAB — CBC
HCT: 33 % — ABNORMAL LOW (ref 36.0–46.0)
Hemoglobin: 10.2 g/dL — ABNORMAL LOW (ref 12.0–15.0)
MCH: 25.5 pg — ABNORMAL LOW (ref 26.0–34.0)
MCHC: 30.9 g/dL (ref 30.0–36.0)
MCV: 82.5 fL (ref 80.0–100.0)
Platelets: 297 10*3/uL (ref 150–400)
RBC: 4 MIL/uL (ref 3.87–5.11)
RDW: 20.2 % — ABNORMAL HIGH (ref 11.5–15.5)
WBC: 5.2 10*3/uL (ref 4.0–10.5)
nRBC: 0 % (ref 0.0–0.2)

## 2022-08-14 LAB — URINALYSIS, ROUTINE W REFLEX MICROSCOPIC
Bilirubin Urine: NEGATIVE
Glucose, UA: NEGATIVE mg/dL
Ketones, ur: NEGATIVE mg/dL
Nitrite: POSITIVE — AB
Protein, ur: NEGATIVE mg/dL
Specific Gravity, Urine: 1.012 (ref 1.005–1.030)
WBC, UA: >50 WBC/hpf — ABNORMAL HIGH (ref 0–5)
pH: 5 (ref 5.0–8.0)

## 2022-08-14 LAB — CBC WITH DIFFERENTIAL/PLATELET
Abs Immature Granulocytes: 0.02 10*3/uL (ref 0.00–0.07)
Basophils Absolute: 0 10*3/uL (ref 0.0–0.1)
Basophils Relative: 0 %
Eosinophils Absolute: 0.2 10*3/uL (ref 0.0–0.5)
Eosinophils Relative: 3 %
HCT: 37.5 % (ref 36.0–46.0)
Hemoglobin: 11.3 g/dL — ABNORMAL LOW (ref 12.0–15.0)
Immature Granulocytes: 0 %
Lymphocytes Relative: 19 %
Lymphs Abs: 1.4 10*3/uL (ref 0.7–4.0)
MCH: 25.6 pg — ABNORMAL LOW (ref 26.0–34.0)
MCHC: 30.1 g/dL (ref 30.0–36.0)
MCV: 84.8 fL (ref 80.0–100.0)
Monocytes Absolute: 0.7 10*3/uL (ref 0.1–1.0)
Monocytes Relative: 10 %
Neutro Abs: 4.7 10*3/uL (ref 1.7–7.7)
Neutrophils Relative %: 68 %
Platelets: 299 10*3/uL (ref 150–400)
RBC: 4.42 MIL/uL (ref 3.87–5.11)
RDW: 20.1 % — ABNORMAL HIGH (ref 11.5–15.5)
WBC: 7 10*3/uL (ref 4.0–10.5)
nRBC: 0 % (ref 0.0–0.2)

## 2022-08-14 LAB — LACTIC ACID, PLASMA
Lactic Acid, Venous: 2.6 mmol/L (ref 0.5–1.9)
Lactic Acid, Venous: 3.1 mmol/L (ref 0.5–1.9)
Lactic Acid, Venous: 0.9 mmol/L (ref 0.5–1.9)

## 2022-08-14 LAB — COMPREHENSIVE METABOLIC PANEL
ALT: 10 U/L (ref 0–44)
AST: 22 U/L (ref 15–41)
Albumin: 3.3 g/dL — ABNORMAL LOW (ref 3.5–5.0)
Alkaline Phosphatase: 60 U/L (ref 38–126)
Anion gap: 9 (ref 5–15)
BUN: 17 mg/dL (ref 8–23)
CO2: 16 mmol/L — ABNORMAL LOW (ref 22–32)
Calcium: 8.1 mg/dL — ABNORMAL LOW (ref 8.9–10.3)
Chloride: 109 mmol/L (ref 98–111)
Creatinine, Ser: 1.37 mg/dL — ABNORMAL HIGH (ref 0.44–1.00)
GFR, Estimated: 39 mL/min — ABNORMAL LOW (ref >60)
Glucose, Bld: 226 mg/dL — ABNORMAL HIGH (ref 70–99)
Potassium: 4 mmol/L (ref 3.5–5.1)
Sodium: 134 mmol/L — ABNORMAL LOW (ref 135–145)
Total Bilirubin: 0.6 mg/dL (ref 0.3–1.2)
Total Protein: 6.8 g/dL (ref 6.5–8.1)

## 2022-08-14 LAB — CREATININE, SERUM
Creatinine, Ser: 1.37 mg/dL — ABNORMAL HIGH (ref 0.44–1.00)
GFR, Estimated: 39 mL/min — ABNORMAL LOW (ref >60)

## 2022-08-14 LAB — BRAIN NATRIURETIC PEPTIDE: B Natriuretic Peptide: 106.4 pg/mL — ABNORMAL HIGH (ref 0.0–100.0)

## 2022-08-14 LAB — BLOOD GAS, VENOUS
Acid-base deficit: 4.4 mmol/L — ABNORMAL HIGH (ref 0.0–2.0)
Bicarbonate: 20.2 mmol/L (ref 20.0–28.0)
O2 Saturation: 53.5 %
Patient temperature: 37
pCO2, Ven: 35 mmHg — ABNORMAL LOW (ref 44–60)
pH, Ven: 7.37 (ref 7.25–7.43)
pO2, Ven: 33 mmHg (ref 32–45)

## 2022-08-14 LAB — TROPONIN I (HIGH SENSITIVITY)
Troponin I (High Sensitivity): 6 ng/L (ref <18)
Troponin I (High Sensitivity): 6 ng/L (ref <18)

## 2022-08-14 LAB — SARS CORONAVIRUS 2 BY RT PCR: SARS Coronavirus 2 by RT PCR: NEGATIVE

## 2022-08-14 MED ORDER — SODIUM CHLORIDE 0.9 % IV SOLN
2.0000 g | INTRAVENOUS | Status: DC
  Administered 2022-08-15 – 2022-08-16 (×2): 2 g via INTRAVENOUS
  Filled 2022-08-14 (×2): qty 20

## 2022-08-14 MED ORDER — INSULIN ASPART 100 UNIT/ML IJ SOLN: 0.0000 [IU] | Freq: Every day | INTRAMUSCULAR | Status: DC

## 2022-08-14 MED ORDER — LACTATED RINGERS IV BOLUS
500.0000 mL | Freq: Once | INTRAVENOUS | Status: AC
  Administered 2022-08-14: 500 mL via INTRAVENOUS

## 2022-08-14 MED ORDER — FAMOTIDINE 20 MG PO TABS
20.0000 mg | ORAL_TABLET | Freq: Every day | ORAL | Status: DC
  Administered 2022-08-14 – 2022-08-17 (×4): 20 mg via ORAL
  Filled 2022-08-14 (×4): qty 1

## 2022-08-14 MED ORDER — ONDANSETRON HCL 4 MG PO TABS: 4.0000 mg | ORAL_TABLET | Freq: Four times a day (QID) | ORAL | Status: DC | PRN

## 2022-08-14 MED ORDER — ENOXAPARIN SODIUM 60 MG/0.6ML IJ SOSY
0.5000 mg/kg | PREFILLED_SYRINGE | INTRAMUSCULAR | Status: DC
  Administered 2022-08-15 – 2022-08-17 (×3): 47.5 mg via SUBCUTANEOUS
  Filled 2022-08-14 (×3): qty 0.6

## 2022-08-14 MED ORDER — PANTOPRAZOLE SODIUM 40 MG PO TBEC
40.0000 mg | DELAYED_RELEASE_TABLET | Freq: Every day | ORAL | Status: DC
  Administered 2022-08-14 – 2022-08-17 (×4): 40 mg via ORAL
  Filled 2022-08-14 (×4): qty 1

## 2022-08-14 MED ORDER — ACETAMINOPHEN 325 MG PO TABS
650.0000 mg | ORAL_TABLET | Freq: Four times a day (QID) | ORAL | Status: DC | PRN
  Administered 2022-08-17: 650 mg via ORAL
  Filled 2022-08-14: qty 2

## 2022-08-14 MED ORDER — SODIUM CHLORIDE 0.9 % IV SOLN
1.0000 g | Freq: Once | INTRAVENOUS | Status: AC
  Administered 2022-08-14: 1 g via INTRAVENOUS
  Filled 2022-08-14: qty 10

## 2022-08-14 MED ORDER — MORPHINE SULFATE (PF) 2 MG/ML IV SOLN: 2.0000 mg | INTRAVENOUS | Status: DC | PRN

## 2022-08-14 MED ORDER — ASPIRIN 81 MG PO TBEC
81.0000 mg | DELAYED_RELEASE_TABLET | ORAL | Status: DC
  Administered 2022-08-14 – 2022-08-16 (×2): 81 mg via ORAL
  Filled 2022-08-14 (×2): qty 1

## 2022-08-14 MED ORDER — LACTATED RINGERS IV BOLUS
1000.0000 mL | Freq: Once | INTRAVENOUS | Status: AC
  Administered 2022-08-14: 1000 mL via INTRAVENOUS

## 2022-08-14 MED ORDER — LACTATED RINGERS IV SOLN: INTRAVENOUS | Status: AC

## 2022-08-14 MED ORDER — ACETAMINOPHEN 650 MG RE SUPP: 650.0000 mg | Freq: Four times a day (QID) | RECTAL | Status: DC | PRN

## 2022-08-14 MED ORDER — INSULIN ASPART 100 UNIT/ML IJ SOLN
0.0000 [IU] | Freq: Three times a day (TID) | INTRAMUSCULAR | Status: DC
  Administered 2022-08-15: 3 [IU] via SUBCUTANEOUS
  Administered 2022-08-15: 2 [IU] via SUBCUTANEOUS
  Administered 2022-08-16: 5 [IU] via SUBCUTANEOUS
  Administered 2022-08-17: 2 [IU] via SUBCUTANEOUS
  Filled 2022-08-14 (×4): qty 1

## 2022-08-14 MED ORDER — ONDANSETRON HCL 4 MG/2ML IJ SOLN
4.0000 mg | Freq: Four times a day (QID) | INTRAMUSCULAR | Status: DC | PRN
  Administered 2022-08-15: 4 mg via INTRAVENOUS
  Filled 2022-08-14: qty 2

## 2022-08-14 NOTE — Assessment & Plan Note (Addendum)
Hemoglobin 11.3, down from 12.3 on 11/17 Patient due to have EGD and colonoscopy scheduled at [facility] Received IV iron during her hospitalization at [facility] in September Continue to monitor H&H

## 2022-08-14 NOTE — Assessment & Plan Note (Signed)
Concern on admission. Mild symptoms. COVID-19 negative.

## 2022-08-14 NOTE — Assessment & Plan Note (Addendum)
s/p radiation and partial vulvectomy   Last saw her GYN oncologist in November 2023

## 2022-08-14 NOTE — Sepsis Progress Note (Signed)
Elink following for Sepsis Protocol 

## 2022-08-14 NOTE — ED Triage Notes (Addendum)
Pt bib ems from home with syncopal episode when pt stood up. Pt was was getting out of the bed and states "everything went black and I went to the floor" but denies falling/hitting her head. Pt endorses cough, sinus congestion and diarrhea for the last few days. Pt with hx anemia requiring transfusions. Complains of chest pain, took 1 sl nitro at home and given '324mg'$  aspirin with ems. En route pt BP dropped to the 00'Y systolic. On arrival BP 95/61.

## 2022-08-14 NOTE — IPAL (Signed)
  Interdisciplinary Goals of Care Family Meeting   Date carried out: 08/14/2022  Location of the meeting: Bedside  Member's involved: Physician, Bedside Registered Nurse, and Family Member or next of kin  Durable Power of Attorney or acting medical decision maker: patient    Discussion: We discussed goals of care for [employer] .    Code status: Full DNR  Disposition: Continue current acute care  Time spent for the meeting: [place], MD  08/14/2022, 10:54 PM

## 2022-08-14 NOTE — H&P (Addendum)
History and Physical    Patient: Carly Cortez CBJ:[id number] DOB: 07/12/1941 DOA: 08/14/2022 DOS: the patient was seen and examined on 08/14/2022 PCP: Verita Lamb, NP  Patient coming from: Home  Chief Complaint:  Chief Complaint  Patient presents with   Loss of Consciousness    HPI: Carly Cortez is a 81 y.o. female with medical history significant for DM; CKD 3a; CAD s/p PCI; dCHF EF>55% 01/2022, HTN, large hiatal hernia, remote history of colon cancer status post partial colectomy and chronic loose stools, cancer of the vulva, frequent UTIs, who at baseline is functionally independent, brought to the emergency room following a syncopal episode.  Patient got up from the side of the bed and as she stood up she felt like " the place went dark" and she fell the floor that had a thick carpet. She did not sustain any injuries.  After she came around, she had chest pain which resolved with the nitroglycerin tablet.  Over the past week patient had been ill with cough and congestion and nonbloody nonmelanotic watery diarrhea , dry heaves with decreased oral intake.  She has had no fever or chills.  Has lower abdominal discomfort that is mostly chronic.  Denies dysuria.  She denies palpitations, shortness of breath.  Denies headache, one-sided weakness numbness or tingling. Of note, patient was hospitalized at [facility] in September with dizziness/chest pain/epigastric pain/iron deficiency anemia.  ACS workup was negative.  Her  symptoms were believed GI in nature and she was referred to GI for follow-up who she saw on 11/17 with plans for future EGD/colonoscopy. ED course and data review: Afebrile, BP as low as 86/56, fluid responsive to 110/63.  Pulse in the 70s, O2 sat in the mid to high 90s on room air.  Labs with normal WBC but lactic acid 2.6>3.1.  Hemoglobin 11.3, down from 12.3 on 11/17.  Creatinine 1.37, up from 0.92 on 9/29.  Troponin 6 and BNP 106.  Urinalysis consistent with UTI.   COVID-negative.  Flu, stool for C. difficile and GI panel all pending. EKG, personally viewed and interpreted showing sinus at 73 with no ischemic ST-T wave changes. CT head with no acute intracranial abnormality. Chest x-ray findings suggestive CHF.  No focal consolidation Patient was treated with a 1.5 L fluid bolus in the ED for dehydration sepsis elevated lactic acid diarrhea and dehydration.  She was started on ceftriaxone for UTI and hospitalist consulted for admission   Review of Systems: As mentioned in the history of present illness. All other systems reviewed and are negative.  Past Medical History:  Diagnosis Date   Cancer ([place])    vaginal   Chronic kidney disease    Diabetes mellitus without complication ([place])    Gout    High cholesterol    Hypertension    Past Surgical History:  Procedure Laterality Date   ABDOMINAL HYSTERECTOMY     APPENDECTOMY     cardiac stents     CHOLECYSTECTOMY     HERNIA REPAIR     PARTIAL COLECTOMY     Social History:  reports that she has never smoked. She has never used smokeless tobacco. She reports that she does not drink alcohol and does not use drugs.  Allergies  Allergen Reactions   Contrast Media [Iodinated Contrast Media] Swelling   Colchicine Other (See Comments)    Gallbladder Problems   Etodolac Swelling   Indomethacin Swelling   Oxaprozin Swelling    Family History  Problem Relation Age  of Onset   Heart attack Mother    Diabetes Mother    Other Father        unknown medical history    Prior to Admission medications   Medication Sig Start Date End Date Taking? Authorizing Provider  carvedilol (COREG) 12.5 MG tablet Take 12.5 mg by mouth 2 (two) times daily with a meal. 08/10/22 08/10/23 Yes [provider]  famotidine (PEPCID) 20 MG tablet Take 1 tablet by mouth daily. 06/01/22  Yes [provider]  lidocaine (XYLOCAINE) 5 % ointment Apply 1 Application topically daily as needed. 07/08/22  Yes [provider]  pregabalin (LYRICA) 75 MG capsule Take 1 capsule by mouth 2 (two) times daily. 03/15/22  Yes [provider]  Vitamin D, Ergocalciferol, (DRISDOL) 1.25 MG (50000 UNIT) CAPS capsule Take 50,000 Units by mouth once a week. 07/15/22  Yes [provider]  amLODipine (NORVASC) 2.5 MG tablet Take 2.5 mg by mouth daily. 08/27/19   [provider]  aspirin 81 MG tablet Take 81 mg by mouth every other day.     [provider]  carvedilol (COREG) 25 MG tablet Take 25 mg by mouth 2 (two) times daily.     [provider]  clopidogrel (PLAVIX) 75 MG tablet Take 75 mg by mouth daily. 07/23/19   [provider]  estradiol (ESTRACE) 0.1 MG/GM vaginal cream Place 2 g vaginally daily. 04/08/20   [provider]  fexofenadine (ALLEGRA) 180 MG tablet Take 180 mg by mouth daily.    [provider]  furosemide (LASIX) 20 MG tablet Take 20 mg by mouth See admin instructions. Take 1 tablet ('20mg'$ ) by mouth daily and take 1 additional tablet ('20mg'$ ) by mouth daily as needed for fluid    [provider]  furosemide (LASIX) 40 MG tablet Take 40 mg by mouth 2 (two) times daily.    [provider]  isosorbide mononitrate (IMDUR) 120 MG 24 hr tablet Take 120 mg by mouth daily. 02/24/20   [provider]  isosorbide mononitrate (IMDUR) 60 MG 24 hr tablet Take 60 mg by mouth daily.    [provider]  LANTUS SOLOSTAR 100 UNIT/ML Solostar Pen Inject 44 Units into the skin 2 (two) times daily. 04/02/20   [provider]  meloxicam (MOBIC) 15 MG tablet Take 15 mg by mouth daily. 08/02/19   [provider]  mometasone (ELOCON) 0.1 % cream Apply 1 application topically 2 (two) times daily. 04/02/20   [provider]  nitroGLYCERIN (NITROSTAT) 0.4 MG SL tablet Place 0.4 mg under the tongue every 5 (five) minutes as needed for chest pain.    [provider]  ondansetron (ZOFRAN) 8 MG  tablet Take 8 mg by mouth every 8 (eight) hours as needed. 03/17/20   [provider]  pantoprazole (PROTONIX) 40 MG tablet Take 40 mg by mouth daily.    [provider]  pregabalin (LYRICA) 50 MG capsule Take 50 mg by mouth 2 (two) times daily.  02/25/19 02/25/20  [provider]  REPATHA SURECLICK 161 MG/ML SOAJ Inject 140 mg into the skin every 14 (fourteen) days.     [provider]  spironolactone (ALDACTONE) 25 MG tablet Take 25 mg by mouth daily.    [provider]    Physical Exam: Vitals:   08/14/22 1645 08/14/22 1646 08/14/22 1730 08/14/22 1830  BP:   (!) 86/56 110/63  Pulse: 72  73 73  Resp: 15  14 19  Temp:  97.7 F (36.5 C)    TempSrc:  Oral    SpO2: 94%  95% 97%   Physical Exam Vitals and nursing note reviewed.  Constitutional:      General: She is not in acute distress.    Comments: Frail appearing elderly female  HENT:     Head: Normocephalic and atraumatic.  Cardiovascular:     Rate and Rhythm: Normal rate and regular rhythm.     Heart sounds: Normal heart sounds.  Pulmonary:     Effort: Pulmonary effort is normal.     Breath sounds: Normal breath sounds.  Abdominal:     Palpations: Abdomen is soft.     Tenderness: There is no abdominal tenderness.  Neurological:     Mental Status: Mental status is at baseline.     Labs on Admission: I have personally reviewed following labs and imaging studies  CBC: Recent Labs  Lab 08/14/22 1647  WBC 7.0  NEUTROABS 4.7  HGB 11.3*  HCT 37.5  MCV 84.8  PLT 401   Basic Metabolic Panel: Recent Labs  Lab 08/14/22 1647  NA 134*  K 4.0  CL 109  CO2 16*  GLUCOSE 226*  BUN 17  CREATININE 1.37*  CALCIUM 8.1*   GFR: CrCl cannot be calculated (Unknown ideal weight.). Liver Function Tests: Recent Labs  Lab 08/14/22 1647  AST 22  ALT 10  ALKPHOS 60  BILITOT 0.6  PROT 6.8  ALBUMIN 3.3*   No results for input(s): "LIPASE", "AMYLASE" in the last 168 hours. No  results for input(s): "AMMONIA" in the last 168 hours. Coagulation Profile: No results for input(s): "INR", "PROTIME" in the last 168 hours. Cardiac Enzymes: No results for input(s): "CKTOTAL", "CKMB", "CKMBINDEX", "TROPONINI" in the last 168 hours. BNP (last 3 results) No results for input(s): "PROBNP" in the last 8760 hours. HbA1C: No results for input(s): "HGBA1C" in the last 72 hours. CBG: No results for input(s): "GLUCAP" in the last 168 hours. Lipid Profile: No results for input(s): "CHOL", "HDL", "LDLCALC", "TRIG", "CHOLHDL", "LDLDIRECT" in the last 72 hours. Thyroid Function Tests: No results for input(s): "TSH", "T4TOTAL", "FREET4", "T3FREE", "THYROIDAB" in the last 72 hours. Anemia Panel: No results for input(s): "VITAMINB12", "FOLATE", "FERRITIN", "TIBC", "IRON", "RETICCTPCT" in the last 72 hours. Urine analysis:    Component Value Date/Time   COLORURINE YELLOW (A) 08/14/2022 1647   APPEARANCEUR CLOUDY (A) 08/14/2022 1647   LABSPEC 1.012 08/14/2022 1647   PHURINE 5.0 08/14/2022 1647   GLUCOSEU NEGATIVE 08/14/2022 1647   HGBUR SMALL (A) 08/14/2022 1647   BILIRUBINUR NEGATIVE 08/14/2022 1647   KETONESUR NEGATIVE 08/14/2022 1647   PROTEINUR NEGATIVE 08/14/2022 1647   NITRITE POSITIVE (A) 08/14/2022 1647   LEUKOCYTESUR LARGE (A) 08/14/2022 1647    Radiological Exams on Admission: CT HEAD WO CONTRAST (5MM)  Result Date: 08/14/2022 CLINICAL DATA:  Head trauma, syncope EXAM: CT HEAD WITHOUT CONTRAST TECHNIQUE: Contiguous axial images were obtained from the base of the skull through the vertex without intravenous contrast. RADIATION DOSE REDUCTION: This exam was performed according to the departmental dose-optimization program which includes automated exposure control, adjustment of the mA and/or kV according to patient size and/or use of iterative reconstruction technique. COMPARISON:  None Available. FINDINGS: Brain: No evidence of acute infarction, hemorrhage,  hydrocephalus, extra-axial collection or mass lesion/mass effect. Periventricular and deep white matter hypodensity. Vascular: No hyperdense vessel or unexpected calcification. Skull: Normal. Negative for fracture or focal lesion. Sinuses/Orbits: No acute finding. Other: None. IMPRESSION: No acute intracranial pathology.  Small-vessel white matter disease. Electronically Signed   By: Delanna Ahmadi M.D.   On: 08/14/2022 17:28   DG Chest 2 View  Result Date: 08/14/2022 CLINICAL DATA:  sob EXAM: PORTABLE CHEST 1 VIEW COMPARISON:  04/12/2020 FINDINGS: Cardiac silhouette is prominent. There is pulmonary interstitial prominence with vascular congestion. No focal consolidation. No pneumothorax or pleural effusion identified. Aorta is calcified. There is retrocardiac opacity consistent with hiatal hernia which is stable. IMPRESSION: Findings suggest CHF. No focal consolidation. Electronically Signed   By: Sammie Bench M.D.   On: 08/14/2022 17:02     Data Reviewed: Relevant notes from primary care and specialist visits, past discharge summaries as available in EHR, including Care Everywhere. Prior diagnostic testing as pertinent to current admission diagnoses Updated medications and problem lists for reconciliation ED course, including vitals, labs, imaging, treatment and response to treatment Triage notes, nursing and pharmacy notes and ED provider's notes Notable results as noted in HPI   Assessment and Plan: * Syncope and collapse Suspect secondary to orthostatic hypotension related to dehydration from diarrhea and poor oral intake CT head with no acute intracranial abnormality IV fluids Neurologic checks, continuous cardiac monitoring  Severe sepsis (HCC) Possible severe sepsis versus SIRS: Criteria include hypotension with AKI and lactic acidosis with UTI and possible gastroenteritis Lactic acid 2.6-3.1 Code sepsis initiated Continue IV fluid resuscitation with monitoring for fluid  overload given history of CHF Continue Rocephin Follow stool studies   Acute gastroenteritis Acute on chronic diarrhea Patient has been having watery stool x 3 days above baseline of soft loose stool Follow GI panel and stool for C. difficile IV hydration Clear liquid diet, IV antiemetics and supportive care Patient follows with GI and has upcoming EGD and colonoscopy to be scheduled by [address] [facility]  UTI (urinary tract infection) History of frequent UTIs Urinalysis consistent with UTI Continue Rocephin Follow cultures  Hypotension History of essential hypertension BP was 86/56 in the ED that was readily fluid responsive Suspect etiology related to orthostatic hypotension from dehydration related to diarrhea and poor oral intake.   Will treat as severe sepsis and continue to monitor Hold home antihypertensives of carvedilol, amlodipine Lasix and Imdur and spironolactone Continue to monitor blood pressure   Acute renal failure superimposed on stage 3a chronic kidney disease (HCC) Creatinine 1.37 up from baseline of 0.92, suspect prerenal Favor related to hypotension as above from dehydration versus sepsis IV hydration, monitor renal function and avoid nephrotoxins  Lactic acidosis Suspect related to acute diarrhea but treated as severe sepsis Continue to trend   Chronic diastolic CHF (congestive heart failure) ([place]) Patient appears clinically dry however BNP slightly elevated at 106 and chest x-ray showing vascular congestion Holding CHF meds of carvedilol, furosemide, Imdur and spironolactone due to hypotension Monitor for exacerbation given IV fluids given for treatment of dehydration and withholding of medication Will get daily weights with intake and output monitoring Last EF in May was over 55%  Chest pain CAD s/p PCI Patient had a brief episode of chest pain following the syncopal episode that was resolved with sublingual nitroglycerin x 1 and has not recurred Troponin  negative and EKG is nonacute Continue aspirin Plavix and Repatha.  Holding carvedilol due to hypotension S/p LHC on 11/04/2021 for ischemic evaluation which showed patent stents in the RCA, LAD, and LCx  [facility] cardiology, last seen October 2023  Uncontrolled type 2 diabetes mellitus with hyperglycemia, with long-term current use of insulin (HCC) Blood sugar 226. Continue basal insulin.  Sliding  scale insulin coverage Follow A1c  IDA (iron deficiency anemia) Hemoglobin 11.3, down from 12.3 on 11/17 Patient due to have EGD and colonoscopy scheduled at [facility] Received IV iron during her hospitalization at [facility] in September Continue to monitor H&H  Large hiatal hernia Follows with GI at [facility].  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 Inpatient status is judged to be reasonable and necessary in order to provide the required intensity of service to ensure the patient's safety. The patient's presenting symptoms, physical exam findings, and initial radiographic and laboratory data in the context of their chronic comorbidities is felt to place them at high risk for further clinical deterioration. Furthermore, it is not anticipated that the patient will be medically stable for discharge from the hospital within 2 midnights of admission.   * I  certify that at the point of admission it is my clinical judgment that the patient will require inpatient hospital care spanning beyond 2 midnights from the point of admission due to high intensity of service, high risk for further deterioration and high frequency of surveillance required.*  Author: Athena Masse, MD 08/14/2022 8:21 PM  For on call review [email or web address].

## 2022-08-14 NOTE — Assessment & Plan Note (Addendum)
Noted  

## 2022-08-14 NOTE — Assessment & Plan Note (Addendum)
Patient has a history of chronic diarrhea. Currently with acute illness. Concern for possible gastroenteritis in setting of sepsis noted on admission. C. Difficile negative. GI pathogen panel pending. -Empiric Ceftriaxone and Flagyl -Follow up GI pathogen panel -Soft diet

## 2022-08-14 NOTE — Assessment & Plan Note (Signed)
Hemoglobin A1C of 6.6% from 03/2022. Patient is managed with Lantus 40 units qHS as an outpatient. Patient with very poor intake this admission and blood sugars in low 100s. Fasting blood sugar of 120 this morning. -Start Semglee 10 units daily -Continue SSI

## 2022-08-14 NOTE — Assessment & Plan Note (Signed)
Noted.

## 2022-08-14 NOTE — Assessment & Plan Note (Addendum)
Present on admission. Secondary to sepsis and dehydration and responded well to IV fluid resuscitation. No requirement for vasopressor support. Resolved. -Hold Imdur, amlodipine, spironolactone (listed as not taking) and Coreg

## 2022-08-14 NOTE — Assessment & Plan Note (Addendum)
Baseline creatinine of about 1.1-1.2 on more recent B/CMP from earlier this year via [facility]. Creatinine of 1.37 on admission. Patient does not meet criteria for AKI.

## 2022-08-14 NOTE — Assessment & Plan Note (Signed)
Patient appears clinically dry however BNP slightly elevated at 106 and chest x-ray showing vascular congestion Holding CHF meds of carvedilol, furosemide, Imdur and spironolactone due to hypotension Monitor for exacerbation given IV fluids given for treatment of dehydration and withholding of medication Will get daily weights with intake and output monitoring Last EF in May was over 55%

## 2022-08-14 NOTE — ED Provider Notes (Signed)
[facility] Provider Note    Event Date/Time   First MD Initiated Contact with Patient 08/14/22 1633     (approximate)   History   Loss of Consciousness   HPI  Carly Cortez is a 81 y.o. female past medical history of diabetes CKD hypertension hyperlipidemia presents with syncope.  Patient was sitting on the side of the bed when she got up to use the bathroom says everything went black and she fell to the ground.  Does not think she hit her head.  Patient said when she woke up she was having some difficulty speaking and had chest tightness so her husband gave her nitro and aspirin.  The chest tightness resolved quickly after taking the nitroglycerin.  Pain feels similar to pain she has had before takes nitro about once every 6 weeks.  She has been sick for the last 3 to 4 days with nasal congestion cough productive of yellow sputum dyspnea.  Says last night she felt she had a fever.  Has also had significant diarrhea over the last 3 to 4 days.  About 6 episodes per day nonbloody.  Patient endorses bilateral lower abdominal pain but this is been going on for several weeks and she is currently getting outpatient workup for this.  Denies vomiting but has had some nausea.  Still tolerating p.o.     Past Medical History:  Diagnosis Date  . 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 Chronic kidney disease   . Diabetes mellitus without complication ([place])   . Gout   . High cholesterol   . Hypertension     Patient Active Problem List   Diagnosis Date Noted  . Lobar pneumonia ([place])   . Hypotension   . Acute kidney injury superimposed on CKD ([place])   . Acute respiratory failure with hypoxia ([place])   . Acute on chronic diastolic CHF (congestive heart failure) ([place]) 04/12/2020  . Anemia 04/12/2020  . Class 1 obesity due to excess calories with body mass index (BMI) of 31.0 to 31.9 in adult 04/12/2020  . Diarrhea   . Chronic renal failure, stage 3a ([place])   . UTI (urinary tract  infection) 10/28/2019  . Acute gastroenteritis 10/28/2019  . Type 2 diabetes mellitus with hypoglycemia without coma, with long-term current use of insulin ([place]) 10/28/2019  . History of partial colectomy 10/28/2019  . History of colon cancer 10/28/2019  . History of cancer of vulva 10/28/2019  . Essential hypertension 10/28/2019  . Hyperlipidemia 10/28/2019  . Sepsis ([place]) 10/28/2019  . Acute metabolic encephalopathy 31/49/7026  . CAD (coronary artery disease) 10/28/2019  . Hyperglycemia due to type 2 diabetes mellitus ([place]) 10/28/2019  . Syncope 12/17/2016     Physical Exam  Triage Vital Signs: ED Triage Vitals  Enc Vitals Group     BP      Pulse      Resp      Temp      Temp src      SpO2      Weight      Height      Head Circumference      Peak Flow      Pain Score      Pain Loc      Pain Edu?      Excl. in [place]?     Most recent vital signs: Vitals:   08/14/22 1730 08/14/22 1830  BP: (!) 86/56 110/63  Pulse: 73 73  Resp: 14  19  Temp:    SpO2: 95% 97%     General: Awake, no distress.  Dry mucous membranes CV:  Good peripheral perfusion.  Resp:  Normal effort.  Abd:  No distention.  Mild tenderness in bilateral lower quadrants abdomen soft no guarding Neuro:             Awake, Alert, Oriented x 3  Other:     ED Results / Procedures / Treatments  Labs (all labs ordered are listed, but only abnormal results are displayed) Labs Reviewed  COMPREHENSIVE METABOLIC PANEL - Abnormal; Notable for the following components:      Result Value   Sodium 134 (*)    CO2 16 (*)    Glucose, Bld 226 (*)    Creatinine, Ser 1.37 (*)    Calcium 8.1 (*)    Albumin 3.3 (*)    GFR, Estimated 39 (*)    All other components within normal limits  CBC WITH DIFFERENTIAL/PLATELET - Abnormal; Notable for the following components:   Hemoglobin 11.3 (*)    MCH 25.6 (*)    RDW 20.1 (*)    All other components within normal limits  URINALYSIS, ROUTINE W REFLEX MICROSCOPIC -  Abnormal; Notable for the following components:   Color, Urine YELLOW (*)    APPearance CLOUDY (*)    Hgb urine dipstick SMALL (*)    Nitrite POSITIVE (*)    Leukocytes,Ua LARGE (*)    WBC, UA >50 (*)    Bacteria, UA MANY (*)    All other components within normal limits  BRAIN NATRIURETIC PEPTIDE - Abnormal; Notable for the following components:   B Natriuretic Peptide 106.4 (*)    All other components within normal limits  BLOOD GAS, VENOUS - Abnormal; Notable for the following components:   pCO2, Ven 35 (*)    Acid-base deficit 4.4 (*)    All other components within normal limits  LACTIC ACID, PLASMA - Abnormal; Notable for the following components:   Lactic Acid, Venous 3.1 (*)    All other components within normal limits  SARS CORONAVIRUS 2 BY RT PCR  GASTROINTESTINAL PANEL BY PCR, STOOL (REPLACES STOOL CULTURE)  C DIFFICILE QUICK SCREEN W PCR REFLEX    URINE CULTURE  LACTIC ACID, PLASMA  TROPONIN I (HIGH SENSITIVITY)  TROPONIN I (HIGH SENSITIVITY)     EKG  EKG reviewed interpreted myself shows sinus rhythm inverted T wave in aVL ST depression lead I and lead II nonspecific interventricular conduction delay and or R wave progression   RADIOLOGY I reviewed and interpreted the CT scan of the brain which does not show any acute intracranial process    PROCEDURES:  Critical Care performed: No  .Critical Care  Performed by: Rada Hay, MD Authorized by: Rada Hay, MD   Critical care provider statement:    Critical care time (minutes):  30   Critical care was time spent personally by me on the following activities:  Development of treatment plan with patient or surrogate, discussions with consultants, evaluation of patient's response to treatment, examination of patient, ordering and review of laboratory studies, ordering and review of radiographic studies, ordering and performing treatments and interventions, pulse oximetry, re-evaluation of patient's  condition and review of old charts   The patient is on the cardiac monitor to evaluate for evidence of arrhythmia and/or significant heart rate changes.   MEDICATIONS ORDERED IN ED: Medications  cefTRIAXone (ROCEPHIN) 1 g in sodium chloride 0.9 % 100 mL IVPB (has  no administration in time range)  lactated ringers bolus 500 mL (has no administration in time range)  lactated ringers bolus 1,000 mL (1,000 mLs Intravenous New Bag/Given 08/14/22 1729)     IMPRESSION / MDM / ASSESSMENT AND PLAN / ED COURSE  I reviewed the triage vital signs and the nursing notes.                              Patient's presentation is most consistent with acute presentation with potential threat to life or bodily function.  Differential diagnosis includes, but is not limited to, hypovolemia, orthostatic hypotension, AKI electrolyte abnormality viral illness ACS  Patient is an 81 year old female presents after syncopal episode.  This is in the setting about 3 to 4 days of a upper respiratory type illness nasal congestion coughing and diarrhea.  She syncopized after standing up off of the bed today with prodrome of lightheadedness and visual loss.  Did have chest pain afterward this was relieved by nitroglycerin and feels similar to chest pain she has had in the past.  Patient's vital signs are overall reassuring blood pressure is on the softer side.  She does have dry mucous membranes.  Abdominal exam is overall reassuring but she does complain of some lower abdominal pain however this has been going on for several weeks.  Sounds like this was orthostatic hypotension.  Will check labs including CBC CMP and CT head given syncope and her age as well as chest x-ray to evaluate for pneumonia given the dyspnea and fever.  Will give a bolus of fluid.   Clinical Course as of 08/14/22 2006  Sun Aug 14, 2022  2005 pO2, Ven: 33 [KM]    Clinical Course User Index [KM] Rada Hay, MD  CT head is negative. CXR  read by rads as reflective of CHF- I have added on a BNP, patient has no peripheral edema and is not hypoxic.  Will continue to give fluids as she looks to me to be intravascularly depleted.  Labs are notable for none anion gap acidosis with bicarb of 16.  Will send lactate and VBG but I suspect this is from diarrhea.  No significant AKI.  Troponin is negative.  Patient did drop her pressure was 80s over 50s this responded to fluids and MAP is greater than 65.  Patient does continue to have some feeling of lightheadedness.  I think given her age hypotension low bicarb that she best be admitted for observation.  I have ordered C. difficile and GI PCR.  Urinalysis is still pending as well.  UA has greater than 50 white blood cells and is nitrate positive.  Will send urine culture.  Her blood pressure has normalized after a bolus of fluid.  I suspect that her hypotension was partly driven by significant diarrhea and volume depletion as opposed to vasodilation from sepsis so I do not think she needs a full 30 cc/kg I also will avoid this given she has history of CHF and they are calling some edema on chest x-ray.  I have ordered her dose of Rocephin and another 500 cc bolus.  Will admit.  FINAL CLINICAL IMPRESSION(S) / ED DIAGNOSES   Final diagnoses:  Hypotension, unspecified hypotension type  Syncope, unspecified syncope type  Urinary tract infection without hematuria, site unspecified     Rx / DC Orders   ED Discharge Orders     None  Note:  This document was prepared using Dragon voice recognition software and may include unintentional dictation errors.   Rada Hay, MD 08/14/22 [phone]

## 2022-08-14 NOTE — Assessment & Plan Note (Addendum)
Suspect secondary to orthostatic hypotension related to dehydration from diarrhea and poor oral intake CT head with no acute intracranial abnormality IV fluids Neurologic checks, continuous cardiac monitoring

## 2022-08-14 NOTE — Assessment & Plan Note (Addendum)
Patient has a history of CAD with PCI. Brief episode of chest pain after her syncopal episodes that resolved with sublingual nitrogen. No recurrent symptoms. Troponin obtained in the ED, which was negative. EKG without acute ST-T wave changes. Patient continues to be chest pain free. Patient is on Imdur, Coreg and aspirin in addition to Repatha. -Continue aspirin

## 2022-08-14 NOTE — Assessment & Plan Note (Addendum)
Suspect related to acute diarrhea but treated as severe sepsis Continue to trend

## 2022-08-14 NOTE — Assessment & Plan Note (Addendum)
Creatinine 1.37 up from baseline of 0.92, suspect prerenal Favor related to hypotension as above from dehydration versus sepsis IV hydration, monitor renal function and avoid nephrotoxins

## 2022-08-14 NOTE — Assessment & Plan Note (Signed)
Urinalysis consistent with UTI. Patient with associated symptoms including dysuria. Patient started empirically on Ceftriaxone IV. Urine culture significant for E. Coli. -Continue Ceftriaxone

## 2022-08-14 NOTE — Assessment & Plan Note (Signed)
Present on admission. Concern for UTI and/or GI source. Patient managed with empiric antibiotics and IV fluid resuscitation with improvement of sepsis physiology. Blood and urine cultures obtained and are still pending (confirmed with lab that cultures are still incubating). -Continue empiric antibiotics -Follow-up culture data

## 2022-08-15 DIAGNOSIS — Z9861 Coronary angioplasty status: Secondary | ICD-10-CM

## 2022-08-15 DIAGNOSIS — I10 Essential (primary) hypertension: Secondary | ICD-10-CM

## 2022-08-15 DIAGNOSIS — A419 Sepsis, unspecified organism: Secondary | ICD-10-CM

## 2022-08-15 DIAGNOSIS — K529 Noninfective gastroenteritis and colitis, unspecified: Secondary | ICD-10-CM | POA: Diagnosis not present

## 2022-08-15 DIAGNOSIS — E876 Hypokalemia: Secondary | ICD-10-CM | POA: Insufficient documentation

## 2022-08-15 DIAGNOSIS — N1831 Chronic kidney disease, stage 3a: Secondary | ICD-10-CM

## 2022-08-15 DIAGNOSIS — E872 Acidosis, unspecified: Secondary | ICD-10-CM

## 2022-08-15 DIAGNOSIS — D509 Iron deficiency anemia, unspecified: Secondary | ICD-10-CM

## 2022-08-15 DIAGNOSIS — Z8544 Personal history of malignant neoplasm of other female genital organs: Secondary | ICD-10-CM

## 2022-08-15 DIAGNOSIS — I9589 Other hypotension: Secondary | ICD-10-CM

## 2022-08-15 DIAGNOSIS — R55 Syncope and collapse: Secondary | ICD-10-CM | POA: Diagnosis not present

## 2022-08-15 DIAGNOSIS — Z85038 Personal history of other malignant neoplasm of large intestine: Secondary | ICD-10-CM

## 2022-08-15 DIAGNOSIS — K449 Diaphragmatic hernia without obstruction or gangrene: Secondary | ICD-10-CM

## 2022-08-15 DIAGNOSIS — I2510 Atherosclerotic heart disease of native coronary artery without angina pectoris: Secondary | ICD-10-CM

## 2022-08-15 DIAGNOSIS — R6520 Severe sepsis without septic shock: Secondary | ICD-10-CM

## 2022-08-15 LAB — HEMOGLOBIN A1C
Hgb A1c MFr Bld: 7.1 % — ABNORMAL HIGH (ref 4.8–5.6)
Mean Plasma Glucose: 157 mg/dL

## 2022-08-15 LAB — CBG MONITORING, ED
Glucose-Capillary: 101 mg/dL — ABNORMAL HIGH (ref 70–99)
Glucose-Capillary: 138 mg/dL — ABNORMAL HIGH (ref 70–99)
Glucose-Capillary: 110 mg/dL — ABNORMAL HIGH (ref 70–99)
Glucose-Capillary: 155 mg/dL — ABNORMAL HIGH (ref 70–99)
Glucose-Capillary: 103 mg/dL — ABNORMAL HIGH (ref 70–99)

## 2022-08-15 LAB — GASTROINTESTINAL PANEL BY PCR, STOOL (REPLACES STOOL CULTURE)

## 2022-08-15 LAB — CBC
HCT: 30.7 % — ABNORMAL LOW (ref 36.0–46.0)
Hemoglobin: 9.4 g/dL — ABNORMAL LOW (ref 12.0–15.0)
MCH: 25.3 pg — ABNORMAL LOW (ref 26.0–34.0)
MCHC: 30.6 g/dL (ref 30.0–36.0)
MCV: 82.5 fL (ref 80.0–100.0)
Platelets: 272 10*3/uL (ref 150–400)
RBC: 3.72 MIL/uL — ABNORMAL LOW (ref 3.87–5.11)
RDW: 20.1 % — ABNORMAL HIGH (ref 11.5–15.5)
WBC: 5 10*3/uL (ref 4.0–10.5)
nRBC: 0 % (ref 0.0–0.2)

## 2022-08-15 LAB — C DIFFICILE QUICK SCREEN W PCR REFLEX
C Diff antigen: NEGATIVE
C Diff interpretation: NOT DETECTED
C Diff toxin: NEGATIVE

## 2022-08-15 LAB — BASIC METABOLIC PANEL
Anion gap: 3 — ABNORMAL LOW (ref 5–15)
BUN: 15 mg/dL (ref 8–23)
CO2: 23 mmol/L (ref 22–32)
Calcium: 8 mg/dL — ABNORMAL LOW (ref 8.9–10.3)
Chloride: 113 mmol/L — ABNORMAL HIGH (ref 98–111)
Creatinine, Ser: 1.27 mg/dL — ABNORMAL HIGH (ref 0.44–1.00)
GFR, Estimated: 43 mL/min — ABNORMAL LOW (ref >60)
Glucose, Bld: 86 mg/dL (ref 70–99)
Potassium: 3.4 mmol/L — ABNORMAL LOW (ref 3.5–5.1)
Sodium: 139 mmol/L (ref 135–145)

## 2022-08-15 LAB — LACTIC ACID, PLASMA: Lactic Acid, Venous: 1.1 mmol/L (ref 0.5–1.9)

## 2022-08-15 MED ORDER — CARVEDILOL 12.5 MG PO TABS
12.5000 mg | ORAL_TABLET | Freq: Two times a day (BID) | ORAL | Status: DC
  Administered 2022-08-15 – 2022-08-17 (×4): 12.5 mg via ORAL
  Filled 2022-08-15: qty 2
  Filled 2022-08-15: qty 1
  Filled 2022-08-15: qty 2
  Filled 2022-08-15: qty 1

## 2022-08-15 MED ORDER — METRONIDAZOLE 500 MG/100ML IV SOLN
500.0000 mg | Freq: Two times a day (BID) | INTRAVENOUS | Status: DC
  Administered 2022-08-15 – 2022-08-17 (×5): 500 mg via INTRAVENOUS
  Filled 2022-08-15 (×5): qty 100

## 2022-08-15 MED ORDER — GERHARDT'S BUTT CREAM
TOPICAL_CREAM | Freq: Two times a day (BID) | CUTANEOUS | Status: DC
  Filled 2022-08-15: qty 1

## 2022-08-15 MED ORDER — POTASSIUM CHLORIDE 20 MEQ PO PACK
40.0000 meq | PACK | Freq: Once | ORAL | Status: AC
  Administered 2022-08-15: 40 meq via ORAL
  Filled 2022-08-15: qty 2

## 2022-08-15 NOTE — Progress Notes (Signed)
PROGRESS NOTE    Carly Cortez  QPY:[id number] DOB: [birth year] DOA: 08/14/2022 PCP: Verita Lamb, NP   Brief Narrative: Carly Cortez is a 81 y.o. female with a history of diabetes, CKD stage IIIa, CAD s/p PCI, diastolic heart failure, hypertension, hiatal hernia, colon cancer s/p partial colectomy, cancer of the vulva, recurrent UTI, chronic diarrhea. Patient presented secondary to syncope and collapse and found to have evidence of severe sepsis on admission requiring IV fluid resuscitation and empiric antibiotics. Source is likely UTI and/or GI infection. Cultures obtained and are pending.   Assessment and Plan: * Syncope and collapse Concern this is secondary to orthostatic hypotension secondary to dehydration from diarrhea and poor oral intake. Patient placed on telemetry. CT head without acute process. -PT/OT prior to discharge  Severe sepsis [facility]) Present on admission. Concern for UTI and/or GI source. Patient managed with empiric antibiotics and IV fluid resuscitation with improvement of sepsis physiology. Blood and urine cultures obtained and are pending. -Continue empiric antibiotics -Follow-up culture data  Acute gastroenteritis Patient has a history of chronic diarrhea. Currently with acute illness. Concern for possible gastroenteritis in setting of sepsis noted on admission. C. Difficile negative. GI pathogen panel pending. -Empiric Ceftriaxone and Flagyl -Follow up GI pathogen panel -Soft diet  UTI (urinary tract infection) Urinalysis consistent with UTI. Patient with associated symptoms including dysuria. Patient started empirically on Ceftriaxone IV -Continue Ceftriaxone -Follow-up urine culture  Hypotension Present on admission. Secondary to sepsis and dehydration and responded well to IV fluid resuscitation. No requirement for vasopressor support. Resolved. -Hold Imdur, amlodipine, spironolactone (listed as not taking) and Coreg  Chronic renal failure, stage  3a (HCC) Baseline creatinine of about 1.1-1.2 on more recent B/CMP from earlier this year via [place]. Creatinine of 1.37 on admission. Patient does not meet criteria for AKI.   Lactic acidosis Secondary to acute illness/sepsis. Resolved with IV fluids.  Chronic diastolic CHF (congestive heart failure) ([place]) Noted. Dry on exam. Patient received IV fluids. Home Coreg, Lasix, Imdur and spironolactone were held secondary to hypotension. -Watch daily weights and strict in/out -Resume home Coreg  Chest pain Patient has a history of CAD with PCI. Brief episode of chest pain after her syncopal episodes that resolved with sublingual nitrogen. No recurrent symptoms. Troponin obtained in the ED, which was negative. EKG without acute ST-T wave changes. Patient continues to be chest pain free. Patient is on Imdur, Coreg and aspirin in addition to Repatha. -Continue aspirin  Uncontrolled type 2 diabetes mellitus with hyperglycemia, with long-term current use of insulin ([place]) Hemoglobin A1C of 6.6% from 03/2022. Patient is managed with Lantus 40 units qHS as an outpatient. Patient with very poor intake this admission and blood sugars in low 100s -Will watch blood sugar today rather than restarting basal secondary to patient's age, poor oral intake and borderline low blood sugar -Continue SSI  IDA (iron deficiency anemia) Baseline hemoglobin appears to be around 8-9, however hemoglobin has been as high as 12.3 most recently on 11/17. Hemoglobin of 11.3 on admission with downward drift since receiving IV fluids. Hemoglobin down to 9.4 today. No associated bleeding noted. High hemoglobin may have been secondary to ongoing hemoconcentration. -Watch for hemorrhaging -CBC in AM  Large hiatal hernia Noted.  Upper respiratory tract infection Concern on admission. Mild symptoms. COVID-19 negative.  Essential hypertension Patient is on amlodipine, Coreg and Imdur as an outpatient. She is also on Lasix  and spironolactone. All antihypertensives held on admission secondary to hypotension. Hypotension has  resolved. -Resume Coreg  History of cancer of vulva Noted.  History of colon cancer s/p partial colectomy Noted.     DVT prophylaxis: Lovenox Code Status:   Code Status: DNR Family Communication: None at bedside Disposition Plan: Anticipate discharge home likely in 2-3 days pending improved diarrhea symptoms and culture data results   Consultants:  None  Procedures:  None  Antimicrobials: Ceftriaxone Flagyl    Subjective: Patient reports no significant issues this morning except for continued diarrhea.  Objective: BP (!) 143/61   Pulse 73   Temp 98 F (36.7 C) (Oral)   Resp 16   Ht '5\' 6"'$  (1.676 m)   Wt 93 kg   SpO2 96%   BMI 33.09 kg/m   Examination:  General exam: Appears calm and comfortable Respiratory system: Clear to auscultation. Respiratory effort normal. Cardiovascular system: S1 & S2 heard, RRR. Gastrointestinal system: Abdomen is nondistended, soft and nontender. Normal bowel sounds heard. Central nervous system: Alert and oriented. No focal neurological deficits. Musculoskeletal: No calf tenderness Skin: No cyanosis. No rashes Psychiatry: Judgement and insight appear normal. Mood & affect appropriate.    Data Reviewed: I have personally reviewed following labs and imaging studies  CBC Lab Results  Component Value Date   WBC 5.0 08/15/2022   RBC 3.72 (L) 08/15/2022   HGB 9.4 (L) 08/15/2022   HCT 30.7 (L) 08/15/2022   MCV 82.5 08/15/2022   MCH 25.3 (L) 08/15/2022   PLT 272 08/15/2022   MCHC 30.6 08/15/2022   RDW 20.1 (H) 08/15/2022   LYMPHSABS 1.4 08/14/2022   MONOABS 0.7 08/14/2022   EOSABS 0.2 08/14/2022   BASOSABS 0.0 57/32/2025     Last metabolic panel Lab Results  Component Value Date   NA 139 08/15/2022   K 3.4 (L) 08/15/2022   CL 113 (H) 08/15/2022   CO2 23 08/15/2022   BUN 15 08/15/2022   CREATININE 1.27 (H)  08/15/2022   GLUCOSE 86 08/15/2022   GFRNONAA 43 (L) 08/15/2022   GFRAA 42 (L) 04/15/2020   CALCIUM 8.0 (L) 08/15/2022   PROT 6.8 08/14/2022   ALBUMIN 3.3 (L) 08/14/2022   BILITOT 0.6 08/14/2022   ALKPHOS 60 08/14/2022   AST 22 08/14/2022   ALT 10 08/14/2022   ANIONGAP 3 (L) 08/15/2022    GFR: Estimated Creatinine Clearance: 40.6 mL/min (A) (by C-G formula based on SCr of 1.27 mg/dL (H)).  Recent Results (from the past 240 hour(s))  SARS Coronavirus 2 by RT PCR (hospital order, performed in [facility] hospital lab) *cepheid single result test* Anterior Nasal Swab     Status: None   Collection Time: 08/14/22  4:40 PM   Specimen: Anterior Nasal Swab  Result Value Ref Range Status   SARS Coronavirus 2 by RT PCR NEGATIVE NEGATIVE Final    Comment: (NOTE) SARS-CoV-2 target nucleic acids are NOT DETECTED.  The SARS-CoV-2 RNA is generally detectable in upper and lower respiratory specimens during the acute phase of infection. The lowest concentration of SARS-CoV-2 viral copies this assay can detect is 250 copies / mL. A negative result does not preclude SARS-CoV-2 infection and should not be used as the sole basis for treatment or other patient management decisions.  A negative result may occur with improper specimen collection / handling, submission of specimen other than nasopharyngeal swab, presence of viral mutation(s) within the areas targeted by this assay, and inadequate number of viral copies (<250 copies / mL). A negative result must be combined with clinical observations, patient history, and  epidemiological information.  Fact Sheet for Patients:   https://www.patel.info/  Fact Sheet for Healthcare Providers: https://hall.com/  This test is not yet approved or  cleared by the [place] FDA and has been authorized for detection and/or diagnosis of SARS-CoV-2 by FDA under an Emergency Use Authorization (EUA).  This EUA will  remain in effect (meaning this test can be used) for the duration of the COVID-19 declaration under Section 564(b)(1) of the Act, 21 U.S.C. section 360bbb-3(b)(1), unless the authorization is terminated or revoked sooner.  Performed at [facility], [address] [place], [place] [address]   Culture, blood (x 2)     Status: None (Preliminary result)   Collection Time: 08/14/22 10:37 PM   Specimen: BLOOD LEFT ARM  Result Value Ref Range Status   Specimen Description BLOOD LEFT ARM  Final   Special Requests   Final    BOTTLES DRAWN AEROBIC AND ANAEROBIC Blood Culture results may not be optimal due to an excessive volume of blood received in culture bottles   Culture   Final    NO GROWTH < 12 HOURS Performed at [facility], [address], [place], [place] [address]    Report Status PENDING  Incomplete  Culture, blood (x 2)     Status: None (Preliminary result)   Collection Time: 08/14/22 10:37 PM   Specimen: BLOOD RIGHT ARM  Result Value Ref Range Status   Specimen Description BLOOD RIGHT ARM  Final   Special Requests   Final    BOTTLES DRAWN AEROBIC AND ANAEROBIC Blood Culture results may not be optimal due to an excessive volume of blood received in culture bottles   Culture   Final    NO GROWTH < 12 HOURS Performed at [facility], [address], [place], [place] [address]    Report Status PENDING  Incomplete      Radiology Studies: CT HEAD WO CONTRAST (5MM)  Result Date: 08/14/2022 CLINICAL DATA:  Head trauma, syncope EXAM: CT HEAD WITHOUT CONTRAST TECHNIQUE: Contiguous axial images were obtained from the base of the skull through the vertex without intravenous contrast. RADIATION DOSE REDUCTION: This exam was performed according to the departmental dose-optimization program which includes automated exposure control, adjustment of the mA and/or kV according to patient size and/or use of iterative reconstruction technique. COMPARISON:  None  Available. FINDINGS: Brain: No evidence of acute infarction, hemorrhage, hydrocephalus, extra-axial collection or mass lesion/mass effect. Periventricular and deep white matter hypodensity. Vascular: No hyperdense vessel or unexpected calcification. Skull: Normal. Negative for fracture or focal lesion. Sinuses/Orbits: No acute finding. Other: None. IMPRESSION: No acute intracranial pathology. Small-vessel white matter disease. Electronically Signed   By: Delanna Ahmadi M.D.   On: 08/14/2022 17:28   DG Chest 2 View  Result Date: 08/14/2022 CLINICAL DATA:  sob EXAM: PORTABLE CHEST 1 VIEW COMPARISON:  04/12/2020 FINDINGS: Cardiac silhouette is prominent. There is pulmonary interstitial prominence with vascular congestion. No focal consolidation. No pneumothorax or pleural effusion identified. Aorta is calcified. There is retrocardiac opacity consistent with hiatal hernia which is stable. IMPRESSION: Findings suggest CHF. No focal consolidation. Electronically Signed   By: Sammie Bench M.D.   On: 08/14/2022 17:02      LOS: 1 day    Cordelia Poche, MD [facility] 08/15/2022, 1:00 PM   If 7PM-7AM, please contact night-coverage [email or web address]

## 2022-08-15 NOTE — Progress Notes (Signed)
Anticoagulation monitoring(Lovenox):  81 yo female ordered Lovenox 40 mg Q24h    Filed Weights   08/15/22 0119  Weight: 93 kg (205 lb)   BMI 33   Lab Results  Component Value Date   CREATININE 1.37 (H) 08/14/2022   CREATININE 1.37 (H) 08/14/2022   CREATININE 1.38 (H) 04/15/2020   Estimated Creatinine Clearance: 37.6 mL/min (A) (by C-G formula based on SCr of 1.37 mg/dL (H)). Hemoglobin & Hematocrit     Component Value Date/Time   HGB 10.2 (L) 08/14/2022 2237   HGB 10.3 (L) 08/14/2014 0051   HCT 33.0 (L) 08/14/2022 2237   HCT 33.0 (L) 08/14/2014 0051     Per Protocol for Patient with estCrcl > 30 ml/min and BMI > 30, will transition to Lovenox 47.5 mg Q24h.

## 2022-08-15 NOTE — ED Notes (Signed)
Patient provided with warm blanket and orange juice and tolerated well. Patient resting on stretcher with no concerns voiced at this time.

## 2022-08-15 NOTE — ED Notes (Signed)
CBG 138

## 2022-08-15 NOTE — ED Notes (Addendum)
Pt was readjusted in the bed and brief changed. Pt is very raw and red in the genital area. Pt was placed on a new purwick and a layers of chucks to promote air to the area for healing.

## 2022-08-15 NOTE — Assessment & Plan Note (Signed)
Patient is on amlodipine, Coreg and Imdur as an outpatient. She is also on Lasix and spironolactone. All antihypertensives held on admission secondary to hypotension. Hypotension has resolved. -Resume Coreg

## 2022-08-15 NOTE — Hospital Course (Signed)
Carly Cortez is a 81 y.o. female with a history of diabetes, CKD stage IIIa, CAD s/p PCI, diastolic heart failure, hypertension, hiatal hernia, colon cancer s/p partial colectomy, cancer of the vulva, recurrent UTI, chronic diarrhea. Patient presented secondary to syncope and collapse and found to have evidence of severe sepsis on admission requiring IV fluid resuscitation and empiric antibiotics. Source is likely UTI and/or GI infection. Urine culture confirms E. Coli infection. Blood cultures are still pending.

## 2022-08-15 NOTE — ED Notes (Signed)
Pt given breakfast tray

## 2022-08-15 NOTE — ED Notes (Signed)
Readjusted pt bed per request.

## 2022-08-15 NOTE — Assessment & Plan Note (Signed)
Supplementation as needed.

## 2022-08-16 ENCOUNTER — Other Ambulatory Visit: Payer: Self-pay

## 2022-08-16 DIAGNOSIS — I5032 Chronic diastolic (congestive) heart failure: Secondary | ICD-10-CM

## 2022-08-16 DIAGNOSIS — N390 Urinary tract infection, site not specified: Secondary | ICD-10-CM | POA: Diagnosis not present

## 2022-08-16 DIAGNOSIS — E876 Hypokalemia: Secondary | ICD-10-CM | POA: Diagnosis not present

## 2022-08-16 DIAGNOSIS — Z794 Long term (current) use of insulin: Secondary | ICD-10-CM

## 2022-08-16 DIAGNOSIS — E1165 Type 2 diabetes mellitus with hyperglycemia: Secondary | ICD-10-CM

## 2022-08-16 DIAGNOSIS — R55 Syncope and collapse: Secondary | ICD-10-CM | POA: Diagnosis not present

## 2022-08-16 DIAGNOSIS — A419 Sepsis, unspecified organism: Secondary | ICD-10-CM | POA: Diagnosis not present

## 2022-08-16 LAB — BASIC METABOLIC PANEL
Anion gap: 4 — ABNORMAL LOW (ref 5–15)
BUN: 8 mg/dL (ref 8–23)
CO2: 21 mmol/L — ABNORMAL LOW (ref 22–32)
Calcium: 8.3 mg/dL — ABNORMAL LOW (ref 8.9–10.3)
Chloride: 114 mmol/L — ABNORMAL HIGH (ref 98–111)
Creatinine, Ser: 1.09 mg/dL — ABNORMAL HIGH (ref 0.44–1.00)
GFR, Estimated: 51 mL/min — ABNORMAL LOW (ref >60)
Glucose, Bld: 120 mg/dL — ABNORMAL HIGH (ref 70–99)
Potassium: 3.7 mmol/L (ref 3.5–5.1)
Sodium: 139 mmol/L (ref 135–145)

## 2022-08-16 LAB — CBG MONITORING, ED
Glucose-Capillary: 121 mg/dL — ABNORMAL HIGH (ref 70–99)
Glucose-Capillary: 202 mg/dL — ABNORMAL HIGH (ref 70–99)
Glucose-Capillary: 186 mg/dL — ABNORMAL HIGH (ref 70–99)

## 2022-08-16 LAB — GLUCOSE, CAPILLARY: Glucose-Capillary: 135 mg/dL — ABNORMAL HIGH (ref 70–99)

## 2022-08-16 LAB — CBC
HCT: 32.3 % — ABNORMAL LOW (ref 36.0–46.0)
Hemoglobin: 10.1 g/dL — ABNORMAL LOW (ref 12.0–15.0)
MCH: 26.2 pg (ref 26.0–34.0)
MCHC: 31.3 g/dL (ref 30.0–36.0)
MCV: 83.7 fL (ref 80.0–100.0)
Platelets: 264 10*3/uL (ref 150–400)
RBC: 3.86 MIL/uL — ABNORMAL LOW (ref 3.87–5.11)
RDW: 19.4 % — ABNORMAL HIGH (ref 11.5–15.5)
WBC: 5.7 10*3/uL (ref 4.0–10.5)
nRBC: 0 % (ref 0.0–0.2)

## 2022-08-16 LAB — MAGNESIUM: Magnesium: 2.1 mg/dL (ref 1.7–2.4)

## 2022-08-16 LAB — URINE CULTURE: Culture: >=100000 — AB

## 2022-08-16 MED ORDER — ISOSORBIDE MONONITRATE ER 30 MG PO TB24
60.0000 mg | ORAL_TABLET | Freq: Every day | ORAL | Status: DC
  Administered 2022-08-16 – 2022-08-17 (×2): 60 mg via ORAL
  Filled 2022-08-16: qty 2
  Filled 2022-08-16: qty 1
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
## 2022-08-16 NOTE — Progress Notes (Signed)
OT Screen Note  Patient Details Name: KARSYNN DEWEESE MRN: [id number] DOB: 1941/08/06   Cancelled Treatment:    Reason Eval/Treat Not Completed: OT screened, no needs identified, will sign off. OT order received, chart reviewed. Per conversation with care team members pt appears at or near baseline level of functional independence with ADL management. Per physical therapy, she is able to perform LB dressing at baseline level and functional mobility in room at baseline level. No skilled OT needs identified. Will sign off at this time. Please re-consult if additional OT needs arise during this hospital stay.    Josephene Marrone 08/16/2022, 9:48 AM

## 2022-08-16 NOTE — Progress Notes (Signed)
PROGRESS NOTE    Carly Cortez  HYI:[id number] DOB: May 21, 1941 DOA: 08/14/2022 PCP: Verita Lamb, NP   Brief Narrative: Carly Cortez is a 81 y.o. female with a history of diabetes, CKD stage IIIa, CAD s/p PCI, diastolic heart failure, hypertension, hiatal hernia, colon cancer s/p partial colectomy, cancer of the vulva, recurrent UTI, chronic diarrhea. Patient presented secondary to syncope and collapse and found to have evidence of severe sepsis on admission requiring IV fluid resuscitation and empiric antibiotics. Source is likely UTI and/or GI infection. Urine culture confirms E. Coli infection. Blood cultures are still pending.   Assessment and Plan: * Syncope and collapse Concern this is secondary to orthostatic hypotension secondary to dehydration from diarrhea and poor oral intake. Patient placed on telemetry. CT head without acute process. PT/OT without recommendations for discharge.  Severe sepsis ([place]) Present on admission. Concern for UTI and/or GI source. Patient managed with empiric antibiotics and IV fluid resuscitation with improvement of sepsis physiology. Blood and urine cultures obtained and are still pending (confirmed with lab that cultures are still incubating). -Continue empiric antibiotics -Follow-up culture data  Acute gastroenteritis Patient has a history of chronic diarrhea. Currently with acute illness. Concern for possible gastroenteritis in setting of sepsis noted on admission. C. Difficile negative. GI pathogen panel negative. Since UTI source is present, will assume no gastroenteritis at this time. -Discontinue Flagyl  UTI (urinary tract infection) Urinalysis consistent with UTI. Patient with associated symptoms including dysuria. Patient started empirically on Ceftriaxone IV. Urine culture significant for E. Coli. -Continue Ceftriaxone  Hypotension-resolved as of 08/16/2022 Present on admission. Secondary to sepsis and dehydration and responded well  to IV fluid resuscitation. No requirement for vasopressor support. Resolved.  Chronic renal failure, stage 3a (HCC) Baseline creatinine of about 1.1-1.2 on more recent B/CMP from earlier this year via [place]. Creatinine of 1.37 on admission. Patient does not meet criteria for AKI.   Lactic acidosis Secondary to acute illness/sepsis. Resolved with IV fluids.  Chronic diastolic CHF (congestive heart failure) ([place]) Noted. Dry on exam. Patient received IV fluids. Home Coreg, Lasix, Imdur and spironolactone were held secondary to hypotension. -Watch daily weights and strict in/out -Continue home Coreg  Chest pain Patient has a history of CAD with PCI. Brief episode of chest pain after her syncopal episodes that resolved with sublingual nitrogen. No recurrent symptoms. Troponin obtained in the ED, which was negative. EKG without acute ST-T wave changes. Patient continues to be chest pain free. Patient is on Imdur, Coreg and aspirin in addition to Repatha. -Continue aspirin  Uncontrolled type 2 diabetes mellitus with hyperglycemia, with long-term current use of insulin ([place]) Hemoglobin A1C of 6.6% from 03/2022. Patient is managed with Lantus 40 units qHS as an outpatient. Patient with very poor intake this admission and blood sugars in low 100s. Fasting blood sugar of 120 this morning. -Start Semglee 10 units daily -Continue SSI  Hypokalemia Supplementation as needed.  IDA (iron deficiency anemia) Baseline hemoglobin appears to be around 8-9, however hemoglobin has been as high as 12.3 most recently on 11/17. Hemoglobin of 11.3 on admission with downward drift since receiving IV fluids. Hemoglobin stable. No associated bleeding noted. High hemoglobin may have been secondary to ongoing hemoconcentration.  Large hiatal hernia Noted.  Upper respiratory tract infection Concern on admission. Mild symptoms. COVID-19 negative.  Essential hypertension Patient is on amlodipine, Coreg and  Imdur as an outpatient. She is also on Lasix and spironolactone. All antihypertensives held on admission secondary to hypotension.  Hypotension has resolved. Blood pressure rising. -Continue Coreg -Restart home Imdur  History of cancer of vulva Noted.  History of colon cancer s/p partial colectomy Noted.     DVT prophylaxis: Lovenox Code Status:   Code Status: DNR Family Communication: None at bedside Disposition Plan: Anticipate discharge home likely in 24 hours pending blood culture data   Consultants:  None  Procedures:  None  Antimicrobials: Ceftriaxone Flagyl    Subjective: Patient reports improvement of diarrhea. No other concerns today.  Objective: BP (!) 143/64 (BP Location: Left Arm)   Pulse 79   Temp 99 F (37.2 C) (Oral)   Resp 18   Ht '5\' 6"'$  (1.676 m)   Wt 93 kg   SpO2 95%   BMI 33.09 kg/m   Examination:  General exam: Appears calm and comfortable Respiratory system: Clear to auscultation. Respiratory effort normal. Cardiovascular system: S1 & S2 heard, RRR. Gastrointestinal system: Abdomen is nondistended, soft and nontender. Normal bowel sounds heard. Central nervous system: Alert and oriented. Musculoskeletal: No edema. No calf tenderness Psychiatry: Judgement and insight appear normal. Mood & affect appropriate.    Data Reviewed: I have personally reviewed following labs and imaging studies  CBC Lab Results  Component Value Date   WBC 5.7 08/16/2022   RBC 3.86 (L) 08/16/2022   HGB 10.1 (L) 08/16/2022   HCT 32.3 (L) 08/16/2022   MCV 83.7 08/16/2022   MCH 26.2 08/16/2022   PLT 264 08/16/2022   MCHC 31.3 08/16/2022   RDW 19.4 (H) 08/16/2022   LYMPHSABS 1.4 08/14/2022   MONOABS 0.7 08/14/2022   EOSABS 0.2 08/14/2022   BASOSABS 0.0 97/41/6384     Last metabolic panel Lab Results  Component Value Date   NA 139 08/16/2022   K 3.7 08/16/2022   CL 114 (H) 08/16/2022   CO2 21 (L) 08/16/2022   BUN 8 08/16/2022   CREATININE 1.09  (H) 08/16/2022   GLUCOSE 120 (H) 08/16/2022   GFRNONAA 51 (L) 08/16/2022   GFRAA 42 (L) 04/15/2020   CALCIUM 8.3 (L) 08/16/2022   PROT 6.8 08/14/2022   ALBUMIN 3.3 (L) 08/14/2022   BILITOT 0.6 08/14/2022   ALKPHOS 60 08/14/2022   AST 22 08/14/2022   ALT 10 08/14/2022   ANIONGAP 4 (L) 08/16/2022    GFR: Estimated Creatinine Clearance: 47.3 mL/min (A) (by C-G formula based on SCr of 1.09 mg/dL (H)).  Recent Results (from the past 240 hour(s))  SARS Coronavirus 2 by RT PCR (hospital order, performed in [facility] hospital lab) *cepheid single result test* Anterior Nasal Swab     Status: None   Collection Time: 08/14/22  4:40 PM   Specimen: Anterior Nasal Swab  Result Value Ref Range Status   SARS Coronavirus 2 by RT PCR NEGATIVE NEGATIVE Final    Comment: (NOTE) SARS-CoV-2 target nucleic acids are NOT DETECTED.  The SARS-CoV-2 RNA is generally detectable in upper and lower respiratory specimens during the acute phase of infection. The lowest concentration of SARS-CoV-2 viral copies this assay can detect is 250 copies / mL. A negative result does not preclude SARS-CoV-2 infection and should not be used as the sole basis for treatment or other patient management decisions.  A negative result may occur with improper specimen collection / handling, submission of specimen other than nasopharyngeal swab, presence of viral mutation(s) within the areas targeted by this assay, and inadequate number of viral copies (<250 copies / mL). A negative result must be combined with clinical observations, patient history, and epidemiological  information.  Fact Sheet for Patients:   https://www.patel.info/  Fact Sheet for Healthcare Providers: https://hall.com/  This test is not yet approved or  cleared by the [place] FDA and has been authorized for detection and/or diagnosis of SARS-CoV-2 by FDA under an Emergency Use Authorization (EUA).  This  EUA will remain in effect (meaning this test can be used) for the duration of the COVID-19 declaration under Section 564(b)(1) of the Act, 21 U.S.C. section 360bbb-3(b)(1), unless the authorization is terminated or revoked sooner.  Performed at [facility], [address], [place], [place] [address]   Urine Culture     Status: Abnormal   Collection Time: 08/14/22  4:47 PM   Specimen: Urine, Clean Catch  Result Value Ref Range Status   Specimen Description   Final    URINE, CLEAN CATCH Performed at [facility], [address], [place], [place] [address]    Special Requests   Final    NONE Performed at [facility], [address] [place], [place] [address]    Culture >=100,000 COLONIES/mL ESCHERICHIA COLI (A)  Final   Report Status 08/16/2022 FINAL  Final   Organism ID, Bacteria ESCHERICHIA COLI (A)  Final      Susceptibility   Escherichia coli - MIC*    AMPICILLIN >=32 RESISTANT Resistant     CEFAZOLIN <=4 SENSITIVE Sensitive     CEFEPIME <=0.12 SENSITIVE Sensitive     CEFTRIAXONE <=0.25 SENSITIVE Sensitive     CIPROFLOXACIN >=4 RESISTANT Resistant     GENTAMICIN <=1 SENSITIVE Sensitive     IMIPENEM <=0.25 SENSITIVE Sensitive     NITROFURANTOIN <=16 SENSITIVE Sensitive     TRIMETH/SULFA <=20 SENSITIVE Sensitive     AMPICILLIN/SULBACTAM >=32 RESISTANT Resistant     PIP/TAZO <=4 SENSITIVE Sensitive     * >=100,000 COLONIES/mL ESCHERICHIA COLI  Culture, blood (x 2)     Status: None (Preliminary result)   Collection Time: 08/14/22 10:37 PM   Specimen: BLOOD LEFT ARM  Result Value Ref Range Status   Specimen Description BLOOD LEFT ARM  Final   Special Requests   Final    BOTTLES DRAWN AEROBIC AND ANAEROBIC Blood Culture results may not be optimal due to an excessive volume of blood received in culture bottles   Culture   Final    NO GROWTH < 12 HOURS Performed at [facility], [address] [place], [place] [address]    Report  Status PENDING  Incomplete  Culture, blood (x 2)     Status: None (Preliminary result)   Collection Time: 08/14/22 10:37 PM   Specimen: BLOOD RIGHT ARM  Result Value Ref Range Status   Specimen Description BLOOD RIGHT ARM  Final   Special Requests   Final    BOTTLES DRAWN AEROBIC AND ANAEROBIC Blood Culture results may not be optimal due to an excessive volume of blood received in culture bottles   Culture   Final    NO GROWTH < 12 HOURS Performed at [facility], [address] [place], [place] [address]    Report Status PENDING  Incomplete  Gastrointestinal Panel by PCR , Stool     Status: None   Collection Time: 08/15/22  1:04 PM   Specimen: Stool  Result Value Ref Range Status   Campylobacter species NOT DETECTED NOT DETECTED Final   Plesimonas shigelloides NOT DETECTED NOT DETECTED Final   Salmonella species NOT DETECTED NOT DETECTED Final   Yersinia enterocolitica NOT DETECTED NOT DETECTED  Final   Vibrio species NOT DETECTED NOT DETECTED Final   Vibrio cholerae NOT DETECTED NOT DETECTED Final   Enteroaggregative E coli (EAEC) NOT DETECTED NOT DETECTED Final   Enteropathogenic E coli (EPEC) NOT DETECTED NOT DETECTED Final   Enterotoxigenic E coli (ETEC) NOT DETECTED NOT DETECTED Final   Shiga like toxin producing E coli (STEC) NOT DETECTED NOT DETECTED Final   Shigella/Enteroinvasive E coli (EIEC) NOT DETECTED NOT DETECTED Final   Cryptosporidium NOT DETECTED NOT DETECTED Final   Cyclospora cayetanensis NOT DETECTED NOT DETECTED Final   Entamoeba histolytica NOT DETECTED NOT DETECTED Final   Giardia lamblia NOT DETECTED NOT DETECTED Final   Adenovirus F40/41 NOT DETECTED NOT DETECTED Final   Astrovirus NOT DETECTED NOT DETECTED Final   Norovirus GI/GII NOT DETECTED NOT DETECTED Final   Rotavirus A NOT DETECTED NOT DETECTED Final   Sapovirus (I, II, IV, and V) NOT DETECTED NOT DETECTED Final    Comment: Performed at [facility], [address]  [place], [place] [address]  C Difficile Quick Screen w PCR reflex     Status: None   Collection Time: 08/15/22  1:04 PM   Specimen: Stool  Result Value Ref Range Status   C Diff antigen NEGATIVE NEGATIVE Final   C Diff toxin NEGATIVE NEGATIVE Final   C Diff interpretation No C. difficile detected.  Final    Comment: Performed at [facility], [address] [place], [place] [address]      Radiology Studies: CT HEAD WO CONTRAST (5MM)  Result Date: 08/14/2022 CLINICAL DATA:  Head trauma, syncope EXAM: CT HEAD WITHOUT CONTRAST TECHNIQUE: Contiguous axial images were obtained from the base of the skull through the vertex without intravenous contrast. RADIATION DOSE REDUCTION: This exam was performed according to the departmental dose-optimization program which includes automated exposure control, adjustment of the mA and/or kV according to patient size and/or use of iterative reconstruction technique. COMPARISON:  None Available. FINDINGS: Brain: No evidence of acute infarction, hemorrhage, hydrocephalus, extra-axial collection or mass lesion/mass effect. Periventricular and deep white matter hypodensity. Vascular: No hyperdense vessel or unexpected calcification. Skull: Normal. Negative for fracture or focal lesion. Sinuses/Orbits: No acute finding. Other: None. IMPRESSION: No acute intracranial pathology. Small-vessel white matter disease. Electronically Signed   By: Delanna Ahmadi M.D.   On: 08/14/2022 17:28   DG Chest 2 View  Result Date: 08/14/2022 CLINICAL DATA:  sob EXAM: PORTABLE CHEST 1 VIEW COMPARISON:  04/12/2020 FINDINGS: Cardiac silhouette is prominent. There is pulmonary interstitial prominence with vascular congestion. No focal consolidation. No pneumothorax or pleural effusion identified. Aorta is calcified. There is retrocardiac opacity consistent with hiatal hernia which is stable. IMPRESSION: Findings suggest CHF. No focal consolidation. Electronically Signed   By: Sammie Bench M.D.   On: 08/14/2022 17:02      LOS: 2 days    Cordelia Poche, MD [facility] 08/16/2022, 1:22 PM   If 7PM-7AM, please contact night-coverage [email or web address]

## 2022-08-16 NOTE — ED Notes (Signed)
Bed remains dirty.

## 2022-08-17 ENCOUNTER — Telehealth: Payer: Self-pay

## 2022-08-17 DIAGNOSIS — D509 Iron deficiency anemia, unspecified: Secondary | ICD-10-CM | POA: Diagnosis not present

## 2022-08-17 DIAGNOSIS — I2510 Atherosclerotic heart disease of native coronary artery without angina pectoris: Secondary | ICD-10-CM | POA: Diagnosis not present

## 2022-08-17 DIAGNOSIS — N1831 Chronic kidney disease, stage 3a: Secondary | ICD-10-CM | POA: Diagnosis not present

## 2022-08-17 DIAGNOSIS — A419 Sepsis, unspecified organism: Secondary | ICD-10-CM | POA: Diagnosis not present

## 2022-08-17 DIAGNOSIS — R079 Chest pain, unspecified: Secondary | ICD-10-CM

## 2022-08-17 LAB — BASIC METABOLIC PANEL
Anion gap: 8 (ref 5–15)
BUN: 10 mg/dL (ref 8–23)
CO2: 21 mmol/L — ABNORMAL LOW (ref 22–32)
Calcium: 8.6 mg/dL — ABNORMAL LOW (ref 8.9–10.3)
Chloride: 110 mmol/L (ref 98–111)
Creatinine, Ser: 1.18 mg/dL — ABNORMAL HIGH (ref 0.44–1.00)
GFR, Estimated: 47 mL/min — ABNORMAL LOW (ref >60)
Glucose, Bld: 190 mg/dL — ABNORMAL HIGH (ref 70–99)
Potassium: 3.6 mmol/L (ref 3.5–5.1)
Sodium: 139 mmol/L (ref 135–145)

## 2022-08-17 LAB — GLUCOSE, CAPILLARY: Glucose-Capillary: 138 mg/dL — ABNORMAL HIGH (ref 70–99)

## 2022-08-17 MED ORDER — CEFDINIR 300 MG PO CAPS: 300.0000 mg | ORAL_CAPSULE | Freq: Two times a day (BID) | ORAL | 0 refills | Status: AC

## 2022-08-17 NOTE — Progress Notes (Signed)
Mobility Specialist - Progress Note  During mobility:SpO2 93%   08/17/22 1055  Mobility  Activity Ambulated with assistance in hallway  Level of Assistance Standby assist, set-up cues, supervision of patient - no hands on  Assistive Device None  Distance Ambulated (ft) 100 ft  Activity Response Tolerated well  Mobility Referral Yes  $Mobility charge 1 Mobility   Pt supine upon entry, utilizing RA. Pt completed bed mob ModI, STS and amb with supervision. Pt ambulated 100 ft in the hallway without AD, tolerated well. Pt denied dizziness, SOB or nausea. Pt returned to room, left sitting EOB with needs within reach.   [place]ie [place] Mobility Specialist 08/17/22 11:06 AM

## 2022-08-17 NOTE — Progress Notes (Signed)
Pt requests to be discharged home so she can go take care of husband. Nurse told pt she didn't have discharge orders and transportation set up. I checked with Neomia Glass NP who confirmed no dischrge orders and n plan yet. Pat informed. She called neighbor to check on husband and she reported he was fine. She calmed down and went to sleep.

## 2022-08-17 NOTE — Progress Notes (Signed)
Pt woke uo anxious and pulled off telemetry and refused to get it back on. Wants to go home. Wants to call [employer] or taxi. Was able to get hold of daughter who came and sat with her for a while before she calmed down and went to sleep. She woke upt at 0630 more alert and apologized for being "difficult" last night. Reassured and calmed down. She still states that she wants to go home today.

## 2022-08-17 NOTE — Consult Note (Addendum)
   Heart Failure Nurse Navigator Note  HFimEF >55%.  Mild left atrial enlargement.  Grade 1 diastolic dysfunction.  She presented to the emergency room after suffering a syncopal spell at home.  BNP 106.  Chest x-ray revealed pulmonary prominence with vascular congestion.  Comorbidities:  Diabetes Chronic kidney disease stage III Coronary artery disease/PCI Hypertension Gout Hyperlipidemia  Medications:  Aspirin 81 mg daily Carvedilol 12.5 mg 2 times a day Also listed is Coreg 25 mg twice a day Isosorbide mononitrate 60 mg daily Furosemide 20 mg daily, 1 additional as needed Also listed furosemide 40 mg 2 times a day  Labs:  Sodium 139, potassium 3.6, chloride 110, CO2 21, BUN 10, creatinine 1.18, BNP 106. Weight not documented Intake and output incomplete  Initial meeting with patient, she is lying quietly in bed in no acute distress, currently on room air.  States that it sounds like she is being discharged home today.  Discussed how she takes care of herself at home.  States that she weighs herself daily and she has been instructed if she sees a weight increase to take an extra 20 mg of Lasix.  She states that she does not really follow with the fluid restriction, she likes to suck on ice throughout the day.   She states that she does not use salt at the table except 1 exception is in the summer she likes salt on her tomatoes.  It aware of the outpatient heart failure clinic here at [facility].  Patient states that she feels that she has enough doctors appointments without adding to it and declines being seen at this time.  Did give her my business card and told her that we would gladly see her at any time she felt necessary.  She voices understanding.  Pricilla Riffle RN [facility]

## 2022-08-17 NOTE — TOC CM/SW Note (Signed)
  Transition of Care [facility]) Screening Note   Patient Details  Name: Carly Cortez Date of Birth: 1941/06/10   Transition of Care [facility]) CM/SW Contact:    Candie Chroman, LCSW Phone Number: 08/17/2022, 8:29 AM    Transition of Care Department [facility]) has reviewed patient and no TOC needs have been identified at this time. We will continue to monitor patient advancement through interdisciplinary progression rounds. If new patient transition needs arise, please place a TOC consult.  Sent secure chat to heart failure nurse navigator to notify her of heart failure consult.

## 2022-08-17 NOTE — Care Management Important Message (Signed)
Important Message  Patient Details  Name: Carly Cortez MRN: [id number] Date of Birth: [birth year]   Medicare Important Message Given:  Yes     Dannette Barbara 08/17/2022, 10:43 AM

## 2022-08-17 NOTE — Discharge Summary (Addendum)
Physician Discharge Summary  Carly Cortez IWL:[id number] DOB: Dec 24, 1940 DOA: 08/14/2022  PCP: Verita Lamb, NP  Admit date: 08/14/2022 Discharge date: 08/17/2022  Admitted From: Home  Discharge disposition: Home   Recommendations for Outpatient Follow-Up:   Follow up with your primary care provider in one week.  Check CBC, BMP, magnesium in the next visit  Discharge Diagnosis:   Principal Problem: E. coli UTI Active Problems:   Syncope and collapse   Acute gastroenteritis   Chronic renal failure, stage 3a (HCC)   Lactic acidosis   Chronic diastolic CHF (congestive heart failure) (HCC)   Uncontrolled type 2 diabetes mellitus with hyperglycemia, with long-term current use of insulin (HCC)   Chest pain   History of colon cancer s/p partial colectomy   History of cancer of vulva   Essential hypertension   CAD S/P percutaneous coronary angioplasty   Upper respiratory tract infection   Large hiatal hernia   IDA (iron deficiency anemia)   Hypokalemia   Discharge Condition: Improved.  Diet recommendation: Low sodium, heart healthy.  Carbohydrate-modified.    Wound care: None.  Code status: Full.   History of Present Illness:   Carly Cortez is a 81 y.o. female with a history of diabetes, CKD stage IIIa, CAD s/p PCI, diastolic heart failure, hypertension, hiatal hernia, colon cancer s/p partial colectomy, cancer of the vulva, recurrent UTI, chronic diarrhea presented to hospital with syncope and collapse and was found to have severe sepsis requiring IV fluids and empiric antibiotic.  Source of infection was thought to be UTI.    Hospital Course:   Following conditions were addressed during hospitalization as listed below,  Syncope and collapse Concern this is secondary to orthostatic hypotension secondary to dehydration from diarrhea and poor oral intake.  Improved at this time.  Patient was seen by physical therapy recommended no skilled therapy on discharge.     Diarrheal episodes. History of chronic diarrhea.  C. difficile and GI pathogen panel was negative.    E. coli UTI (urinary tract infection), sepsis ruled out Received IV Rocephin during hospitalization.  Will continue on Omnicef for the next 3 days to complete the course.  Blood cultures negative in 3 days.  Urine culture showed E. coli.   Hypotension-resolved  Initially thought to be secondary to sepsis and volume depletion.  Sepsis has been ruled out.  Likely secondary to volume depletion and diarrhea.   Chronic renal failure, stage 3a (HCC) Baseline creatinine of about 1.1-1.2.  Creatinine on discharge at 1.1.  Lactic acidosis Resolved.   Chronic diastolic CHF (congestive heart failure) (HCC) Patient was volume depleted on presentation so diuretic was on hold.  Received IV fluids.  Will restart cardiac medications on discharge.   Chest pain Patient with history of CAD and PCI in the past.  EKG and troponins were negative.  Continue Imdur Coreg aspirin and Repatha on discharge.  No further chest pain.   Uncontrolled type 2 diabetes mellitus with hyperglycemia, with long-term current use of insulin  Hemoglobin A1C of 6.6% from 03/2022. Patient is managed with Lantus 40 units qHS as an outpatient.  Will resume at discharge.   Hypokalemia Improved after supplement.  Potassium prior to discharge was 3.6.   IDA (iron deficiency anemia) Baseline hemoglobin appears to be around 8-9, latest hemoglobin of 10.1.   Large hiatal hernia Noted.   Upper respiratory tract infection Improved.  COVID-19 was negative.   Essential hypertension Continue amlodipine Coreg isosorbide from outpatient.  History of cancer  of vulva  History of colon cancer s/p partial colectomy Stable at this time.  Disposition.  At this time, patient is stable for disposition home with outpatient PCP follow-up.  Medical Consultants:   None.  Procedures:    None Subjective:   Today, patient was seen  and examined at bedside.  Feels well and wishes to go home.  Denies any shortness of breath chest pain dizziness lightheadedness.  She states that she lives with her husband at home.  Physical therapy has seen the patient and recommend no skilled therapy needs.  Discharge Exam:   Vitals:   08/17/22 0557 08/17/22 0801  BP: (!) 158/82 (!) 140/79  Pulse: 81 79  Resp: 20 18  Temp: (!) 97.5 F (36.4 C) 98.2 F (36.8 C)  SpO2: 96% 99%   Body mass index is 33.09 kg/m.   General: Alert awake, not in obvious distress, obese, not in obvious distress, HENT: pupils equally reacting to light,  No scleral pallor or icterus noted. Oral mucosa is moist.  Chest:  Diminished breath sounds bilaterally.  CVS: S1 &S2 heard. No murmur.  Regular rate and rhythm. Abdomen: Soft, nontender, nondistended.  Bowel sounds are heard.   Extremities: No cyanosis, clubbing or edema.  Peripheral pulses are palpable. Psych: Alert, awake and oriented, normal mood CNS:  No cranial nerve deficits.  Power equal in all extremities.   Skin: Warm and dry.  No rashes noted.  The results of significant diagnostics from this hospitalization (including imaging, microbiology, ancillary and laboratory) are listed below for reference.     Diagnostic Studies:   CT HEAD WO CONTRAST (5MM)  Result Date: 08/14/2022 CLINICAL DATA:  Head trauma, syncope EXAM: CT HEAD WITHOUT CONTRAST TECHNIQUE: Contiguous axial images were obtained from the base of the skull through the vertex without intravenous contrast. RADIATION DOSE REDUCTION: This exam was performed according to the departmental dose-optimization program which includes automated exposure control, adjustment of the mA and/or kV according to patient size and/or use of iterative reconstruction technique. COMPARISON:  None Available. FINDINGS: Brain: No evidence of acute infarction, hemorrhage, hydrocephalus, extra-axial collection or mass lesion/mass effect. Periventricular and deep  white matter hypodensity. Vascular: No hyperdense vessel or unexpected calcification. Skull: Normal. Negative for fracture or focal lesion. Sinuses/Orbits: No acute finding. Other: None. IMPRESSION: No acute intracranial pathology. Small-vessel white matter disease. Electronically Signed   By: Delanna Ahmadi M.D.   On: 08/14/2022 17:28   DG Chest 2 View  Result Date: 08/14/2022 CLINICAL DATA:  sob EXAM: PORTABLE CHEST 1 VIEW COMPARISON:  04/12/2020 FINDINGS: Cardiac silhouette is prominent. There is pulmonary interstitial prominence with vascular congestion. No focal consolidation. No pneumothorax or pleural effusion identified. Aorta is calcified. There is retrocardiac opacity consistent with hiatal hernia which is stable. IMPRESSION: Findings suggest CHF. No focal consolidation. Electronically Signed   By: Sammie Bench M.D.   On: 08/14/2022 17:02    Labs:   Basic Metabolic Panel: Recent Labs  Lab 08/14/22 1647 08/14/22 2237 08/15/22 0258 08/16/22 0526 08/17/22 0151  NA 134*  --  139 139 139  K 4.0  --  3.4* 3.7 3.6  CL 109  --  113* 114* 110  CO2 16*  --  23 21* 21*  GLUCOSE 226*  --  86 120* 190*  BUN 17  --  '15 8 10  '$ CREATININE 1.37* 1.37* 1.27* 1.09* 1.18*  CALCIUM 8.1*  --  8.0* 8.3* 8.6*  MG  --   --   --  2.1  --  GFR Estimated Creatinine Clearance: 43.7 mL/min (A) (by C-G formula based on SCr of 1.18 mg/dL (H)). Liver Function Tests: Recent Labs  Lab 08/14/22 1647  AST 22  ALT 10  ALKPHOS 60  BILITOT 0.6  PROT 6.8  ALBUMIN 3.3*   No results for input(s): "LIPASE", "AMYLASE" in the last 168 hours. No results for input(s): "AMMONIA" in the last 168 hours. Coagulation profile No results for input(s): "INR", "PROTIME" in the last 168 hours.  CBC: Recent Labs  Lab 08/14/22 1647 08/14/22 2237 08/15/22 0258 08/16/22 0526  WBC 7.0 5.2 5.0 5.7  NEUTROABS 4.7  --   --   --   HGB 11.3* 10.2* 9.4* 10.1*  HCT 37.5 33.0* 30.7* 32.3*  MCV 84.8 82.5 82.5 83.7   PLT 299 297 272 264   Cardiac Enzymes: No results for input(s): "CKTOTAL", "CKMB", "CKMBINDEX", "TROPONINI" in the last 168 hours. BNP: Invalid input(s): "POCBNP" CBG: Recent Labs  Lab 08/16/22 0824 08/16/22 1144 08/16/22 1649 08/16/22 2131 08/17/22 0802  GLUCAP 121* 202* 186* 135* 138*   D-Dimer No results for input(s): "DDIMER" in the last 72 hours. Hgb A1c Recent Labs    08/14/22 2237  HGBA1C 7.1*   Lipid Profile No results for input(s): "CHOL", "HDL", "LDLCALC", "TRIG", "CHOLHDL", "LDLDIRECT" in the last 72 hours. Thyroid function studies No results for input(s): "TSH", "T4TOTAL", "T3FREE", "THYROIDAB" in the last 72 hours.  Invalid input(s): "FREET3" Anemia work up No results for input(s): "VITAMINB12", "FOLATE", "FERRITIN", "TIBC", "IRON", "RETICCTPCT" in the last 72 hours. Microbiology Recent Results (from the past 240 hour(s))  SARS Coronavirus 2 by RT PCR (hospital order, performed in [facility] hospital lab) *cepheid single result test* Anterior Nasal Swab     Status: None   Collection Time: 08/14/22  4:40 PM   Specimen: Anterior Nasal Swab  Result Value Ref Range Status   SARS Coronavirus 2 by RT PCR NEGATIVE NEGATIVE Final    Comment: (NOTE) SARS-CoV-2 target nucleic acids are NOT DETECTED.  The SARS-CoV-2 RNA is generally detectable in upper and lower respiratory specimens during the acute phase of infection. The lowest concentration of SARS-CoV-2 viral copies this assay can detect is 250 copies / mL. A negative result does not preclude SARS-CoV-2 infection and should not be used as the sole basis for treatment or other patient management decisions.  A negative result may occur with improper specimen collection / handling, submission of specimen other than nasopharyngeal swab, presence of viral mutation(s) within the areas targeted by this assay, and inadequate number of viral copies (<250 copies / mL). A negative result must be combined with  clinical observations, patient history, and epidemiological information.  Fact Sheet for Patients:   https://www.patel.info/  Fact Sheet for Healthcare Providers: https://hall.com/  This test is not yet approved or  cleared by the [place] FDA and has been authorized for detection and/or diagnosis of SARS-CoV-2 by FDA under an Emergency Use Authorization (EUA).  This EUA will remain in effect (meaning this test can be used) for the duration of the COVID-19 declaration under Section 564(b)(1) of the Act, 21 U.S.C. section 360bbb-3(b)(1), unless the authorization is terminated or revoked sooner.  Performed at [facility], [address] [place], [place] [address]   Urine Culture     Status: Abnormal   Collection Time: 08/14/22  4:47 PM   Specimen: Urine, Clean Catch  Result Value Ref Range Status   Specimen Description   Final    URINE, CLEAN CATCH Performed at [facility], [address], [place], [place] [address]    Special Requests   Final    NONE Performed at [facility], [address] [place], [place] [address]    Culture >=100,000 COLONIES/mL ESCHERICHIA COLI (A)  Final   Report Status 08/16/2022 FINAL  Final   Organism ID, Bacteria ESCHERICHIA COLI (A)  Final      Susceptibility   Escherichia coli - MIC*    AMPICILLIN >=32 RESISTANT Resistant     CEFAZOLIN <=4 SENSITIVE Sensitive     CEFEPIME <=0.12 SENSITIVE Sensitive     CEFTRIAXONE <=0.25 SENSITIVE Sensitive     CIPROFLOXACIN >=4 RESISTANT Resistant     GENTAMICIN <=1 SENSITIVE Sensitive     IMIPENEM <=0.25 SENSITIVE Sensitive     NITROFURANTOIN <=16 SENSITIVE Sensitive     TRIMETH/SULFA <=20 SENSITIVE Sensitive     AMPICILLIN/SULBACTAM >=32 RESISTANT Resistant     PIP/TAZO <=4 SENSITIVE Sensitive     * >=100,000 COLONIES/mL ESCHERICHIA COLI  Culture, blood (x 2)     Status: None (Preliminary result)   Collection Time:  08/14/22 10:37 PM   Specimen: BLOOD LEFT ARM  Result Value Ref Range Status   Specimen Description BLOOD LEFT ARM  Final   Special Requests   Final    BOTTLES DRAWN AEROBIC AND ANAEROBIC Blood Culture results may not be optimal due to an excessive volume of blood received in culture bottles   Culture   Final    NO GROWTH 3 DAYS Performed at [facility], [address] [place], [place] [address]    Report Status PENDING  Incomplete  Culture, blood (x 2)     Status: None (Preliminary result)   Collection Time: 08/14/22 10:37 PM   Specimen: BLOOD RIGHT ARM  Result Value Ref Range Status   Specimen Description BLOOD RIGHT ARM  Final   Special Requests   Final    BOTTLES DRAWN AEROBIC AND ANAEROBIC Blood Culture results may not be optimal due to an excessive volume of blood received in culture bottles   Culture   Final    NO GROWTH 3 DAYS Performed at [facility], [address] [place], [place] [address]    Report Status PENDING  Incomplete  Gastrointestinal Panel by PCR , Stool     Status: None   Collection Time: 08/15/22  1:04 PM   Specimen: Stool  Result Value Ref Range Status   Campylobacter species NOT DETECTED NOT DETECTED Final   Plesimonas shigelloides NOT DETECTED NOT DETECTED Final   Salmonella species NOT DETECTED NOT DETECTED Final   Yersinia enterocolitica NOT DETECTED NOT DETECTED Final   Vibrio species NOT DETECTED NOT DETECTED Final   Vibrio cholerae NOT DETECTED NOT DETECTED Final   Enteroaggregative E coli (EAEC) NOT DETECTED NOT DETECTED Final   Enteropathogenic E coli (EPEC) NOT DETECTED NOT DETECTED Final   Enterotoxigenic E coli (ETEC) NOT DETECTED NOT DETECTED Final   Shiga like toxin producing E coli (STEC) NOT DETECTED NOT DETECTED Final   Shigella/Enteroinvasive E coli (EIEC) NOT DETECTED NOT DETECTED Final   Cryptosporidium NOT DETECTED NOT DETECTED Final   Cyclospora cayetanensis NOT DETECTED NOT DETECTED Final   Entamoeba  histolytica NOT DETECTED NOT DETECTED Final   Giardia lamblia NOT DETECTED NOT DETECTED Final   Adenovirus F40/41 NOT DETECTED NOT DETECTED Final   Astrovirus NOT DETECTED NOT DETECTED Final   Norovirus GI/GII NOT DETECTED NOT DETECTED Final   Rotavirus A NOT DETECTED NOT DETECTED Final  Sapovirus (I, II, IV, and V) NOT DETECTED NOT DETECTED Final    Comment: Performed at [facility], [address] [place], [place] [address]  C Difficile Quick Screen w PCR reflex     Status: None   Collection Time: 08/15/22  1:04 PM   Specimen: Stool  Result Value Ref Range Status   C Diff antigen NEGATIVE NEGATIVE Final   C Diff toxin NEGATIVE NEGATIVE Final   C Diff interpretation No C. difficile detected.  Final    Comment: Performed at [facility], [address] [place], [place] [address]     Discharge Instructions:   Discharge Instructions     Call MD for:  persistant nausea and vomiting   Complete by: As directed    Call MD for:  temperature >100.4   Complete by: As directed    Diet - low sodium heart healthy   Complete by: As directed    Discharge instructions   Complete by: As directed    Follow up with your primary care provider in one week. Check blood work at that time. Complete the course of antibiotics. Take time to change positions especially from lying to standing. Seek medical attention for worsening symptoms.   Increase activity slowly   Complete by: As directed       Allergies as of 08/17/2022       Reactions   Contrast Media [iodinated Contrast Media] Swelling   Colchicine Other (See Comments)   Gallbladder Problems   Etodolac Swelling   Indomethacin Swelling   Oxaprozin Swelling        Medication List     STOP taking these medications    clopidogrel 75 MG tablet Commonly known as: PLAVIX   spironolactone 25 MG tablet Commonly known as: ALDACTONE       TAKE these medications    amLODipine 2.5 MG tablet Commonly known as:  NORVASC Take 2.5 mg by mouth daily.   aspirin 81 MG tablet Take 81 mg by mouth every other day.   carvedilol 12.5 MG tablet Commonly known as: COREG Take 12.5 mg by mouth 2 (two) times daily with a meal. What changed: Another medication with the same name was removed. Continue taking this medication, and follow the directions you see here.   cefdinir 300 MG capsule Commonly known as: OMNICEF Take 1 capsule (300 mg total) by mouth 2 (two) times daily for 3 days.   estradiol 0.1 MG/GM vaginal cream Commonly known as: ESTRACE Place 2 g vaginally daily.   famotidine 20 MG tablet Commonly known as: PEPCID Take 1 tablet by mouth daily.   fexofenadine 180 MG tablet Commonly known as: ALLEGRA Take 180 mg by mouth daily.   furosemide 40 MG tablet Commonly known as: LASIX Take 40 mg by mouth 2 (two) times daily. What changed: Another medication with the same name was removed. Continue taking this medication, and follow the directions you see here.   isosorbide mononitrate 60 MG 24 hr tablet Commonly known as: IMDUR Take 60 mg by mouth daily.   isosorbide mononitrate 120 MG 24 hr tablet Commonly known as: IMDUR Take 120 mg by mouth daily.   Lantus SoloStar 100 UNIT/ML Solostar Pen Generic drug: insulin glargine Inject 40 Units into the skin at bedtime.   lidocaine 5 % ointment Commonly known as: XYLOCAINE Apply 1 Application topically daily as needed.   meloxicam 15 MG tablet Commonly known as: MOBIC Take 15 mg by mouth daily.   mometasone 0.1 %  cream Commonly known as: ELOCON Apply 1 application topically 2 (two) times daily.   nitroGLYCERIN 0.4 MG SL tablet Commonly known as: NITROSTAT Place 0.4 mg under the tongue every 5 (five) minutes as needed for chest pain.   ondansetron 8 MG tablet Commonly known as: ZOFRAN Take 8 mg by mouth every 8 (eight) hours as needed.   pantoprazole 40 MG tablet Commonly known as: PROTONIX Take 40 mg by mouth daily.    pregabalin 50 MG capsule Commonly known as: LYRICA Take 50 mg by mouth 2 (two) times daily.   pregabalin 75 MG capsule Commonly known as: LYRICA Take 75 mg by mouth daily as needed (Pain).   Repatha SureClick 163 MG/ML Soaj Generic drug: Evolocumab Inject 140 mg into the skin every 14 (fourteen) days.   Vitamin D (Ergocalciferol) 1.25 MG (50000 UNIT) Caps capsule Commonly known as: DRISDOL Take 50,000 Units by mouth once a week.         Time coordinating discharge: 39 minutes  Signed:  Cythnia Osmun  [facility] 08/17/2022, 2:47 PM

## 2022-08-19 LAB — CULTURE, BLOOD (ROUTINE X 2)
Culture: NO GROWTH
Culture: NO GROWTH

## 2023-05-13 ENCOUNTER — Inpatient Hospital Stay
Admission: EM | Admit: 2023-05-13 | Discharge: 2023-05-19 | DRG: 689 | Disposition: A | Discharge status: Skilled Nursing Facility | Payer: 59 | Attending: Internal Medicine | Admitting: Internal Medicine

## 2023-05-13 ENCOUNTER — Other Ambulatory Visit: Payer: Self-pay

## 2023-05-13 ENCOUNTER — Emergency Department: Payer: 59

## 2023-05-13 DIAGNOSIS — I5030 Unspecified diastolic (congestive) heart failure: Secondary | ICD-10-CM

## 2023-05-13 DIAGNOSIS — W06XXXA Fall from bed, initial encounter: Secondary | ICD-10-CM | POA: Diagnosis present

## 2023-05-13 DIAGNOSIS — Z91041 Radiographic dye allergy status: Secondary | ICD-10-CM

## 2023-05-13 DIAGNOSIS — E669 Obesity, unspecified: Secondary | ICD-10-CM | POA: Diagnosis present

## 2023-05-13 DIAGNOSIS — Z7982 Long term (current) use of aspirin: Secondary | ICD-10-CM | POA: Diagnosis not present

## 2023-05-13 DIAGNOSIS — I2510 Atherosclerotic heart disease of native coronary artery without angina pectoris: Secondary | ICD-10-CM | POA: Diagnosis present

## 2023-05-13 DIAGNOSIS — R531 Weakness: Secondary | ICD-10-CM

## 2023-05-13 DIAGNOSIS — Z90710 Acquired absence of both cervix and uterus: Secondary | ICD-10-CM | POA: Diagnosis not present

## 2023-05-13 DIAGNOSIS — N179 Acute kidney failure, unspecified: Secondary | ICD-10-CM | POA: Diagnosis present

## 2023-05-13 DIAGNOSIS — R296 Repeated falls: Secondary | ICD-10-CM | POA: Diagnosis present

## 2023-05-13 DIAGNOSIS — I639 Cerebral infarction, unspecified: Secondary | ICD-10-CM | POA: Insufficient documentation

## 2023-05-13 DIAGNOSIS — Z8249 Family history of ischemic heart disease and other diseases of the circulatory system: Secondary | ICD-10-CM

## 2023-05-13 DIAGNOSIS — I63522 Cerebral infarction due to unspecified occlusion or stenosis of left anterior cerebral artery: Secondary | ICD-10-CM | POA: Diagnosis not present

## 2023-05-13 DIAGNOSIS — D649 Anemia, unspecified: Secondary | ICD-10-CM | POA: Diagnosis present

## 2023-05-13 DIAGNOSIS — Z833 Family history of diabetes mellitus: Secondary | ICD-10-CM | POA: Diagnosis not present

## 2023-05-13 DIAGNOSIS — M25551 Pain in right hip: Secondary | ICD-10-CM | POA: Diagnosis present

## 2023-05-13 DIAGNOSIS — W19XXXA Unspecified fall, initial encounter: Principal | ICD-10-CM | POA: Insufficient documentation

## 2023-05-13 DIAGNOSIS — Y92009 Unspecified place in unspecified non-institutional (private) residence as the place of occurrence of the external cause: Secondary | ICD-10-CM

## 2023-05-13 DIAGNOSIS — G8191 Hemiplegia, unspecified affecting right dominant side: Secondary | ICD-10-CM | POA: Diagnosis not present

## 2023-05-13 DIAGNOSIS — Z1152 Encounter for screening for COVID-19: Secondary | ICD-10-CM

## 2023-05-13 DIAGNOSIS — N390 Urinary tract infection, site not specified: Secondary | ICD-10-CM | POA: Diagnosis present

## 2023-05-13 DIAGNOSIS — Z888 Allergy status to other drugs, medicaments and biological substances status: Secondary | ICD-10-CM

## 2023-05-13 DIAGNOSIS — R9089 Other abnormal findings on diagnostic imaging of central nervous system: Secondary | ICD-10-CM | POA: Diagnosis not present

## 2023-05-13 DIAGNOSIS — Z79899 Other long term (current) drug therapy: Secondary | ICD-10-CM

## 2023-05-13 DIAGNOSIS — R29898 Other symptoms and signs involving the musculoskeletal system: Secondary | ICD-10-CM | POA: Diagnosis not present

## 2023-05-13 DIAGNOSIS — M79605 Pain in left leg: Secondary | ICD-10-CM | POA: Diagnosis present

## 2023-05-13 DIAGNOSIS — E1165 Type 2 diabetes mellitus with hyperglycemia: Secondary | ICD-10-CM | POA: Diagnosis present

## 2023-05-13 DIAGNOSIS — B9620 Unspecified Escherichia coli [E. coli] as the cause of diseases classified elsewhere: Secondary | ICD-10-CM | POA: Diagnosis present

## 2023-05-13 DIAGNOSIS — Z9861 Coronary angioplasty status: Secondary | ICD-10-CM | POA: Diagnosis not present

## 2023-05-13 DIAGNOSIS — I5032 Chronic diastolic (congestive) heart failure: Secondary | ICD-10-CM | POA: Diagnosis present

## 2023-05-13 DIAGNOSIS — N189 Chronic kidney disease, unspecified: Secondary | ICD-10-CM

## 2023-05-13 DIAGNOSIS — E7800 Pure hypercholesterolemia, unspecified: Secondary | ICD-10-CM | POA: Diagnosis present

## 2023-05-13 DIAGNOSIS — Z791 Long term (current) use of non-steroidal anti-inflammatories (NSAID): Secondary | ICD-10-CM

## 2023-05-13 DIAGNOSIS — E1122 Type 2 diabetes mellitus with diabetic chronic kidney disease: Secondary | ICD-10-CM | POA: Diagnosis present

## 2023-05-13 DIAGNOSIS — E860 Dehydration: Secondary | ICD-10-CM | POA: Diagnosis present

## 2023-05-13 DIAGNOSIS — I10 Essential (primary) hypertension: Secondary | ICD-10-CM | POA: Diagnosis present

## 2023-05-13 DIAGNOSIS — I6389 Other cerebral infarction: Secondary | ICD-10-CM | POA: Diagnosis not present

## 2023-05-13 DIAGNOSIS — N1832 Chronic kidney disease, stage 3b: Secondary | ICD-10-CM | POA: Diagnosis present

## 2023-05-13 DIAGNOSIS — R29709 NIHSS score 9: Secondary | ICD-10-CM | POA: Diagnosis not present

## 2023-05-13 DIAGNOSIS — Z8544 Personal history of malignant neoplasm of other female genital organs: Secondary | ICD-10-CM

## 2023-05-13 DIAGNOSIS — Z955 Presence of coronary angioplasty implant and graft: Secondary | ICD-10-CM

## 2023-05-13 DIAGNOSIS — I130 Hypertensive heart and chronic kidney disease with heart failure and stage 1 through stage 4 chronic kidney disease, or unspecified chronic kidney disease: Secondary | ICD-10-CM | POA: Diagnosis present

## 2023-05-13 DIAGNOSIS — M109 Gout, unspecified: Secondary | ICD-10-CM | POA: Diagnosis present

## 2023-05-13 DIAGNOSIS — Z6831 Body mass index (BMI) 31.0-31.9, adult: Secondary | ICD-10-CM

## 2023-05-13 DIAGNOSIS — Z794 Long term (current) use of insulin: Secondary | ICD-10-CM

## 2023-05-13 LAB — SARS CORONAVIRUS 2 BY RT PCR: SARS Coronavirus 2 by RT PCR: NEGATIVE

## 2023-05-13 LAB — URINALYSIS, ROUTINE W REFLEX MICROSCOPIC
Bilirubin Urine: NEGATIVE
Glucose, UA: NEGATIVE mg/dL
Ketones, ur: NEGATIVE mg/dL
Nitrite: POSITIVE — AB
Protein, ur: 30 mg/dL — AB
Specific Gravity, Urine: 1.019 (ref 1.005–1.030)
WBC, UA: >50 WBC/hpf (ref 0–5)
pH: 5 (ref 5.0–8.0)

## 2023-05-13 LAB — COMPREHENSIVE METABOLIC PANEL
ALT: 10 U/L (ref 0–44)
AST: 25 U/L (ref 15–41)
Albumin: 3.5 g/dL (ref 3.5–5.0)
Alkaline Phosphatase: 63 U/L (ref 38–126)
Anion gap: 10 (ref 5–15)
BUN: 40 mg/dL — ABNORMAL HIGH (ref 8–23)
CO2: 21 mmol/L — ABNORMAL LOW (ref 22–32)
Calcium: 9.5 mg/dL (ref 8.9–10.3)
Chloride: 104 mmol/L (ref 98–111)
Creatinine, Ser: 2.19 mg/dL — ABNORMAL HIGH (ref 0.44–1.00)
GFR, Estimated: 22 mL/min — ABNORMAL LOW (ref >60)
Glucose, Bld: 176 mg/dL — ABNORMAL HIGH (ref 70–99)
Potassium: 4.5 mmol/L (ref 3.5–5.1)
Sodium: 135 mmol/L (ref 135–145)
Total Bilirubin: 0.8 mg/dL (ref 0.3–1.2)
Total Protein: 7.4 g/dL (ref 6.5–8.1)

## 2023-05-13 LAB — TROPONIN I (HIGH SENSITIVITY)
Troponin I (High Sensitivity): 8 ng/L (ref <18)
Troponin I (High Sensitivity): 7 ng/L (ref <18)

## 2023-05-13 LAB — CBC WITH DIFFERENTIAL/PLATELET
Abs Immature Granulocytes: 0.03 10*3/uL (ref 0.00–0.07)
Basophils Absolute: 0.1 10*3/uL (ref 0.0–0.1)
Basophils Relative: 1 %
Eosinophils Absolute: 0.1 10*3/uL (ref 0.0–0.5)
Eosinophils Relative: 1 %
HCT: 40.8 % (ref 36.0–46.0)
Hemoglobin: 13.2 g/dL (ref 12.0–15.0)
Immature Granulocytes: 0 %
Lymphocytes Relative: 11 %
Lymphs Abs: 1.2 10*3/uL (ref 0.7–4.0)
MCH: 28 pg (ref 26.0–34.0)
MCHC: 32.4 g/dL (ref 30.0–36.0)
MCV: 86.6 fL (ref 80.0–100.0)
Monocytes Absolute: 0.8 10*3/uL (ref 0.1–1.0)
Monocytes Relative: 7 %
Neutro Abs: 8.8 10*3/uL — ABNORMAL HIGH (ref 1.7–7.7)
Neutrophils Relative %: 80 %
Platelets: 272 10*3/uL (ref 150–400)
RBC: 4.71 MIL/uL (ref 3.87–5.11)
Smear Review: NORMAL
WBC: 11 10*3/uL — ABNORMAL HIGH (ref 4.0–10.5)
nRBC: 0 % (ref 0.0–0.2)

## 2023-05-13 LAB — BLOOD GAS, VENOUS

## 2023-05-13 LAB — GLUCOSE, CAPILLARY
Glucose-Capillary: 98 mg/dL (ref 70–99)
Glucose-Capillary: 279 mg/dL — ABNORMAL HIGH (ref 70–99)
Glucose-Capillary: 281 mg/dL — ABNORMAL HIGH (ref 70–99)

## 2023-05-13 LAB — AMMONIA: Ammonia: <10 umol/L (ref 9–35)

## 2023-05-13 MED ORDER — PREGABALIN 75 MG PO CAPS
75.0000 mg | ORAL_CAPSULE | Freq: Every day | ORAL | Status: DC | PRN
  Administered 2023-05-14 – 2023-05-16 (×3): 75 mg via ORAL
  Filled 2023-05-13 (×5): qty 1

## 2023-05-13 MED ORDER — SODIUM CHLORIDE 0.9 % IV SOLN
1.0000 g | Freq: Once | INTRAVENOUS | Status: AC
  Administered 2023-05-13: 1 g via INTRAVENOUS
  Filled 2023-05-13: qty 10

## 2023-05-13 MED ORDER — ASPIRIN 81 MG PO TBEC
81.0000 mg | DELAYED_RELEASE_TABLET | ORAL | Status: DC
  Administered 2023-05-13 – 2023-05-19 (×4): 81 mg via ORAL
  Filled 2023-05-13 (×4): qty 1

## 2023-05-13 MED ORDER — ONDANSETRON HCL 4 MG/2ML IJ SOLN: 4.0000 mg | Freq: Four times a day (QID) | INTRAMUSCULAR | Status: DC | PRN

## 2023-05-13 MED ORDER — ENOXAPARIN SODIUM 40 MG/0.4ML IJ SOSY
40.0000 mg | PREFILLED_SYRINGE | INTRAMUSCULAR | Status: DC
  Administered 2023-05-13: 40 mg via SUBCUTANEOUS
  Filled 2023-05-13: qty 0.4

## 2023-05-13 MED ORDER — ISOSORBIDE MONONITRATE ER 60 MG PO TB24
60.0000 mg | ORAL_TABLET | Freq: Every day | ORAL | Status: DC
  Administered 2023-05-13 – 2023-05-19 (×7): 60 mg via ORAL
  Filled 2023-05-13 (×3): qty 2
  Filled 2023-05-13: qty 1
  Filled 2023-05-13 (×3): qty 2

## 2023-05-13 MED ORDER — PANTOPRAZOLE SODIUM 40 MG PO TBEC
40.0000 mg | DELAYED_RELEASE_TABLET | Freq: Every day | ORAL | Status: DC
  Administered 2023-05-13 – 2023-05-19 (×7): 40 mg via ORAL
  Filled 2023-05-13 (×7): qty 1

## 2023-05-13 MED ORDER — INSULIN GLARGINE-YFGN 100 UNIT/ML ~~LOC~~ SOLN
20.0000 [IU] | Freq: Every day | SUBCUTANEOUS | Status: DC
  Administered 2023-05-13 – 2023-05-19 (×7): 20 [IU] via SUBCUTANEOUS
  Filled 2023-05-13 (×7): qty 0.2

## 2023-05-13 MED ORDER — INSULIN ASPART 100 UNIT/ML IJ SOLN
0.0000 [IU] | Freq: Three times a day (TID) | INTRAMUSCULAR | Status: DC
  Administered 2023-05-14: 3 [IU] via SUBCUTANEOUS
  Administered 2023-05-14 – 2023-05-15 (×2): 2 [IU] via SUBCUTANEOUS
  Administered 2023-05-15: 1 [IU] via SUBCUTANEOUS
  Administered 2023-05-16: 7 [IU] via SUBCUTANEOUS
  Administered 2023-05-16: 1 [IU] via SUBCUTANEOUS
  Administered 2023-05-16: 2 [IU] via SUBCUTANEOUS
  Administered 2023-05-17 (×2): 3 [IU] via SUBCUTANEOUS
  Administered 2023-05-17: 1 [IU] via SUBCUTANEOUS
  Administered 2023-05-18 (×2): 2 [IU] via SUBCUTANEOUS
  Administered 2023-05-18: 3 [IU] via SUBCUTANEOUS
  Administered 2023-05-19: 2 [IU] via SUBCUTANEOUS
  Filled 2023-05-13 (×16): qty 1

## 2023-05-13 MED ORDER — SODIUM CHLORIDE 0.9 % IV SOLN
1.0000 g | Freq: Once | INTRAVENOUS | Status: AC
  Administered 2023-05-14: 1 g via INTRAVENOUS
  Filled 2023-05-13: qty 10

## 2023-05-13 MED ORDER — ONDANSETRON HCL 4 MG PO TABS: 4.0000 mg | ORAL_TABLET | Freq: Four times a day (QID) | ORAL | Status: DC | PRN

## 2023-05-13 MED ORDER — SODIUM CHLORIDE 0.9 % IV BOLUS
500.0000 mL | Freq: Once | INTRAVENOUS | Status: AC
  Administered 2023-05-13: 500 mL via INTRAVENOUS

## 2023-05-13 MED ORDER — SODIUM CHLORIDE 0.9 % IV SOLN: INTRAVENOUS | Status: DC

## 2023-05-13 NOTE — Assessment & Plan Note (Signed)
Baseline Cr 1-1.4 w/ GFR in 30s-50s  Cr 2 today w/ GFR in 20s  Clinically dry  Will place on IVF hydration  Hold nephrotoxic agents  Check FeNa  Renal imaging as clinically indicated

## 2023-05-13 NOTE — Assessment & Plan Note (Signed)
Noted weakness involving dysuria and urinalysis indicative of infection IV Rocephin Urine culture Mild

## 2023-05-13 NOTE — Assessment & Plan Note (Signed)
-  Hgb stable at 13

## 2023-05-13 NOTE — ED Provider Notes (Signed)
[facility] Provider Note    Event Date/Time   First MD Initiated Contact with Patient 05/13/23 1231     (approximate)   History   Fall   HPI  Carly Cortez is a 82 y.o. female   history of CKD diabetes hypertension presents to the ER from home due to altered mental status frequent falls having rolled out of bed twice this morning.  Feels weak and unable to stand last felt normal last night.  Denies any abdominal pain no hip pain.  Does not recall hitting her head.      Physical Exam   Triage Vital Signs: ED Triage Vitals [05/13/23 1237]  Encounter Vitals Group     BP      Systolic BP Percentile      Diastolic BP Percentile      Pulse Rate 80     Resp 15     Temp      Temp src      SpO2 91 %     Weight      Height      Head Circumference      Peak Flow      Pain Score      Pain Loc      Pain Education      Exclude from Growth Chart     Most recent vital signs: Vitals:   05/13/23 1430 05/13/23 1445  BP:    Pulse: 81 72  Resp:    SpO2: 95% 96%     Constitutional: Alert  Eyes: Conjunctivae are normal.  Head: Atraumatic. Nose: No congestion/rhinnorhea. Mouth/Throat: Mucous membranes are moist.   Neck: Painless ROM.  Cardiovascular:   Good peripheral circulation. Respiratory: Normal respiratory effort.  No retractions.  Gastrointestinal: Soft and nontender.  Musculoskeletal:  no deformity Neurologic:  MAE spontaneously. No gross focal neurologic deficits are appreciated.  Skin:  Skin is warm, dry and intact. No rash noted. Psychiatric: Mood and affect are normal. Speech and behavior are normal.    ED Results / Procedures / Treatments   Labs (all labs ordered are listed, but only abnormal results are displayed) Labs Reviewed  CBC WITH DIFFERENTIAL/PLATELET - Abnormal; Notable for the following components:      Result Value   WBC 11.0 (*)    Neutro Abs 8.8 (*)    All other components within normal limits  COMPREHENSIVE  METABOLIC PANEL - Abnormal; Notable for the following components:   CO2 21 (*)    Glucose, Bld 176 (*)    BUN 40 (*)    Creatinine, Ser 2.19 (*)    GFR, Estimated 22 (*)    All other components within normal limits  BLOOD GAS, VENOUS - Abnormal; Notable for the following components:   pCO2, Ven 43 (*)    All other components within normal limits  SARS CORONAVIRUS 2 BY RT PCR  AMMONIA  URINALYSIS, ROUTINE W REFLEX MICROSCOPIC  TROPONIN I (HIGH SENSITIVITY)  TROPONIN I (HIGH SENSITIVITY)     EKG  ED ECG REPORT I, Willy Eddy, the attending physician, personally viewed and interpreted this ECG.   Date: 05/13/2023  EKG Time: 12:40  Rate: 80  Rhythm: sinus  Axis: normal  Intervals:lbbb  ST&T Change: no stemi, no depressions    RADIOLOGY Please see ED Course for my review and interpretation.  I personally reviewed all radiographic images ordered to evaluate for the above acute complaints and reviewed radiology reports and findings.  These findings were  personally discussed with the patient.  Please see medical record for radiology report.    PROCEDURES:  Critical Care performed: No  Procedures   MEDICATIONS ORDERED IN ED: Medications  sodium chloride 0.9 % bolus 500 mL (500 mLs Intravenous New Bag/Given 05/13/23 1401)     IMPRESSION / MDM / ASSESSMENT AND PLAN / ED COURSE  I reviewed the triage vital signs and the nursing notes.                              Differential diagnosis includes, but is not limited to, Dehydration, sepsis, pna, uti, hypoglycemia, cva, drug effect, withdrawal, encephalitis   Patient presenting to the ER for evaluation of symptoms as described above.  Based on symptoms, risk factors and considered above differential, this presenting complaint could reflect a potentially life-threatening illness therefore the patient will be placed on continuous pulse oximetry and telemetry for monitoring.  Laboratory evaluation will be sent to  evaluate for the above complaints.      Clinical Course as of 05/13/23 1519  Sat May 13, 2023  1258 Chest x-ray my review and interpretation without evidence of consolidation. [PR]  1352 CMP with mild AKI will give some IV fluids. [PR]  1517 X-ray of the right hip concerning for nondisplaced fracture CT recommended per radiology.  Patient be signed out oncoming physician pending follow-up CT as well as urinalysis. [PR]    Clinical Course User Index [PR] Willy Eddy, MD     FINAL CLINICAL IMPRESSION(S) / ED DIAGNOSES   Final diagnoses:  Fall, initial encounter  Dehydration  Pain of right hip     Rx / DC Orders   ED Discharge Orders     None        Note:  This document was prepared using Dragon voice recognition software and may include unintentional dictation errors.    Willy Eddy, MD 05/13/23 [phone]

## 2023-05-13 NOTE — H&P (Signed)
History and Physical    Patient: Carly Cortez GMW:[id number] DOB: 1941-07-30 DOA: 05/13/2023 DOS: the patient was seen and examined on 05/13/2023 PCP: [name], NP  Patient coming from: Home  Chief Complaint:  Chief Complaint  Patient presents with   Fall   HPI: Carly Cortez is a 82 y.o. female with medical history significant of vulvar cancer, stage III CKD, type 2 diabetes, hypertension, hyperlipidemia, HFpEF presenting with fall, weakness, UTI, acute on chronic kidney disease.  Patient reports worsening weakness over the past 1 to 2 days.  Positive generalized malaise, decreased p.o. intake.  Positive dysuria and increased urinary frequency.  Weakness came to the point where patient had a fall today.  No reported head trauma loss consciousness.  Patient did land on her right side.  Fell x 2.  No fevers or chills.  No chest pain or shortness of breath.  No abdominal pain.  No focal hemiparesis or confusion. Presented to the ER afebrile, hemodynamically stable.  White count 11, hemoglobin 13.2, platelets 272, creatinine 2.2, glucose 176, urinalysis indicative of infection.  Question subacute fracture on plain films of the right hip.  Chest x-ray, CT head, right shoulder as well as CT of the hip grossly stable and negative for any fracture. Review of Systems: As mentioned in the history of present illness. All other systems reviewed and are negative. Past Medical History:  Diagnosis Date   Cancer (HCC)    vaginal   Chronic kidney disease    Diabetes mellitus without complication (HCC)    Gout    High cholesterol    Hypertension    Past Surgical History:  Procedure Laterality Date   ABDOMINAL HYSTERECTOMY     APPENDECTOMY     cardiac stents     CHOLECYSTECTOMY     HERNIA REPAIR     PARTIAL COLECTOMY     Social History:  reports that she has never smoked. She has never used smokeless tobacco. She reports that she does not drink alcohol and does not use drugs.  Allergies   Allergen Reactions   Contrast Media [Iodinated Contrast Media] Swelling   Colchicine Other (See Comments)    Gallbladder Problems   Etodolac Swelling   Indomethacin Swelling   Oxaprozin Swelling    Family History  Problem Relation Age of Onset   Heart attack Mother    Diabetes Mother    Other Father        unknown medical history    Prior to Admission medications   Medication Sig Start Date End Date Taking? Authorizing Provider  amLODipine (NORVASC) 2.5 MG tablet Take 2.5 mg by mouth daily. 08/27/19   [provider]  aspirin 81 MG tablet Take 81 mg by mouth every other day.     [provider]  carvedilol (COREG) 12.5 MG tablet Take 12.5 mg by mouth 2 (two) times daily with a meal. 08/10/22 08/10/23  [provider]  estradiol (ESTRACE) 0.1 MG/GM vaginal cream Place 2 g vaginally daily. 04/08/20   [provider]  famotidine (PEPCID) 20 MG tablet Take 1 tablet by mouth daily. 06/01/22   [provider]  fexofenadine (ALLEGRA) 180 MG tablet Take 180 mg by mouth daily.    [provider]  furosemide (LASIX) 40 MG tablet Take 40 mg by mouth 2 (two) times daily.    [provider]  isosorbide mononitrate (IMDUR) 120 MG 24 hr tablet Take 120 mg by mouth daily. Patient not taking: Reported on 08/15/2022 02/24/20  [provider]  isosorbide mononitrate (IMDUR) 60 MG 24 hr tablet Take 60 mg by mouth daily.    [provider]  LANTUS SOLOSTAR 100 UNIT/ML Solostar Pen Inject 40 Units into the skin at bedtime. 04/02/20   [provider]  lidocaine (XYLOCAINE) 5 % ointment Apply 1 Application topically daily as needed. 07/08/22   [provider]  meloxicam (MOBIC) 15 MG tablet Take 15 mg by mouth daily. 08/02/19   [provider]  mometasone (ELOCON) 0.1 % cream Apply 1 application topically 2 (two) times daily. 04/02/20   [provider]  nitroGLYCERIN (NITROSTAT) 0.4 MG SL tablet  Place 0.4 mg under the tongue every 5 (five) minutes as needed for chest pain.    [provider]  ondansetron (ZOFRAN) 8 MG tablet Take 8 mg by mouth every 8 (eight) hours as needed. 03/17/20   [provider]  pantoprazole (PROTONIX) 40 MG tablet Take 40 mg by mouth daily.    [provider]  pregabalin (LYRICA) 50 MG capsule Take 50 mg by mouth 2 (two) times daily.  02/25/19 02/25/20  [provider]  pregabalin (LYRICA) 75 MG capsule Take 75 mg by mouth daily as needed (Pain). 03/15/22   [provider]  REPATHA SURECLICK 140 MG/ML SOAJ Inject 140 mg into the skin every 14 (fourteen) days.     [provider]  Vitamin D, Ergocalciferol, (DRISDOL) 1.25 MG (50000 UNIT) CAPS capsule Take 50,000 Units by mouth once a week. 07/15/22   [provider]    Physical Exam: Vitals:   05/13/23 1402 05/13/23 1430 05/13/23 1445 05/13/23 1700  BP:    138/66  Pulse:  81 72 75  Resp:    20  Temp:    (!) 97 F (36.1 C)  TempSrc:    Oral  SpO2: 96% 95% 96% 98%  Weight:      Height:       Physical Exam Constitutional:      Appearance: She is obese.  HENT:     Head: Normocephalic and atraumatic.     Nose: Nose normal.     Mouth/Throat:     Mouth: Mucous membranes are moist.  Cardiovascular:     Rate and Rhythm: Normal rate and regular rhythm.  Pulmonary:     Effort: Pulmonary effort is normal.  Abdominal:     General: Abdomen is flat. Bowel sounds are normal.  Musculoskeletal:     Cervical back: Normal range of motion.     Comments: + generalized weakness and R side hip TTP  Skin:    General: Skin is dry.  Neurological:     General: No focal deficit present.  Psychiatric:        Mood and Affect: Mood normal.     Data Reviewed:  There are no new results to review at this time.  CT Hip Right Wo Contrast CLINICAL DATA:  Fall and right hip pain.  EXAM: CT OF THE RIGHT HIP WITHOUT CONTRAST  TECHNIQUE: Multidetector CT  imaging of the right hip was performed according to the standard protocol. Multiplanar CT image reconstructions were also generated.  RADIATION DOSE REDUCTION: This exam was performed according to the departmental dose-optimization program which includes automated exposure control, adjustment of the mA and/or kV according to patient size and/or use of iterative reconstruction technique.  COMPARISON:  Right hip radiograph dated 05/13/2023.  FINDINGS: Bones/Joint/Cartilage  No definite acute fracture. Cortical irregularity of the superior right femoral neck likely chronic. A  nondisplaced fracture is less likely. MRI may provide better evaluation if there is high clinical concern for acute fracture. There is no dislocation. The bones are osteopenic. There is mild arthritic changes of the right hip. No joint effusion.  Ligaments  Suboptimally assessed by CT.  Muscles and Tendons  No acute findings.  No intramuscular fluid collection or hematoma.  Soft tissues  Sigmoid diverticulosis.  IMPRESSION: 1. No definite acute fracture or dislocation. 2. Osteopenia and mild arthritic changes of the right hip.  Electronically Signed   By: Elgie Collard M.D.   On: 05/13/2023 16:39 DG Hip Unilat W or Wo Pelvis 2-3 Views Right CLINICAL DATA:  Status post fall, right hip pain  EXAM: DG HIP (WITH OR WITHOUT PELVIS) 2-3V RIGHT  COMPARISON:  None Available.  FINDINGS: Generalized osteopenia. Slight cortical irregularity along the right superior femoral head-neck junction concerning for a subtle nondisplaced fracture. No other acute fracture or dislocation. No aggressive osseous lesion. Normal alignment.  Peripheral vascular atherosclerotic disease. No soft tissue abnormality. No radiopaque foreign body or soft tissue emphysema.  IMPRESSION: 1. Slight cortical irregularity along the right superior femoral head-neck junction concerning for a subtle nondisplaced fracture. Given  the patient's age and osteopenia, recommend a CT of the right hip for further evaluation.  Electronically Signed   By: Elige Ko M.D.   On: 05/13/2023 15:01 DG Shoulder Right CLINICAL DATA:  Status post fall, right shoulder pain  EXAM: RIGHT SHOULDER - 2+ VIEW  COMPARISON:  None Available.  FINDINGS: No acute fracture or dislocation. No aggressive osseous lesion. Normal alignment. Generalized osteopenia. Mild arthropathy of the acromioclavicular joint.  Soft tissue are unremarkable. No radiopaque foreign body or soft tissue emphysema.  IMPRESSION: 1. No acute osseous injury of the right shoulder.  Electronically Signed   By: Elige Ko M.D.   On: 05/13/2023 14:59 CT HEAD WO CONTRAST CLINICAL DATA:  Altered mental status  EXAM: CT HEAD WITHOUT CONTRAST  TECHNIQUE: Contiguous axial images were obtained from the base of the skull through the vertex without intravenous contrast.  RADIATION DOSE REDUCTION: This exam was performed according to the departmental dose-optimization program which includes automated exposure control, adjustment of the mA and/or kV according to patient size and/or use of iterative reconstruction technique.  COMPARISON:  CT Head 08/14/22  FINDINGS: Brain: No evidence of acute infarction, hemorrhage, hydrocephalus, extra-axial collection or mass lesion/mass effect.Sequela of mild chronic microvascular ischemic change. Mild dural thickening in the right middle cranial fossa, unchanged from prior.  Vascular: No hyperdense vessel or unexpected calcification.  Skull: Normal. Negative for fracture or focal lesion.  Sinuses/Orbits: No acute finding.  Other: None.  IMPRESSION: No acute intracranial abnormality.  Electronically Signed   By: Lorenza Cambridge M.D.   On: 05/13/2023 14:49 DG Chest Portable 1 View CLINICAL DATA:  Weakness  EXAM: PORTABLE CHEST 1 VIEW  COMPARISON:  Chest x-ray dated August 14, 2022  FINDINGS: Cardiac  and mediastinal contours are unchanged. Moderate-to-large hiatal hernia. Mild bibasilar opacities. No large pleural effusion or evidence of pneumothorax.  IMPRESSION: 1. Mild bibasilar opacities, likely atelectasis. 2. Moderate-to-large hiatal hernia.  Electronically Signed   By: Allegra Lai M.D.   On: 05/13/2023 14:12  Lab Results  Component Value Date   WBC 11.0 (H) 05/13/2023   HGB 13.2 05/13/2023   HCT 40.8 05/13/2023   MCV 86.6 05/13/2023   PLT 272 05/13/2023   Last metabolic panel Lab Results  Component Value Date   GLUCOSE 176 (H) 05/13/2023  NA 135 05/13/2023   K 4.5 05/13/2023   CL 104 05/13/2023   CO2 21 (L) 05/13/2023   BUN 40 (H) 05/13/2023   CREATININE 2.19 (H) 05/13/2023   GFRNONAA 22 (L) 05/13/2023   CALCIUM 9.5 05/13/2023   PROT 7.4 05/13/2023   ALBUMIN 3.5 05/13/2023   BILITOT 0.8 05/13/2023   ALKPHOS 63 05/13/2023   AST 25 05/13/2023   ALT 10 05/13/2023   ANIONGAP 10 05/13/2023    Assessment and Plan: * UTI (urinary tract infection) Noted weakness involving dysuria and urinalysis indicative of infection IV Rocephin Urine culture Mild  Weakness Progressive weakness in the setting of acute UTI Noted active vulvar cancer, dehydration or confounding issues Nonfocal exam IV fluid hydration PT OT evaluation Follow precautions Monitor  Acute kidney injury superimposed on chronic kidney disease (HCC) Baseline Cr 1-1.4 w/ GFR in 30s-50s  Cr 2 today w/ GFR in 20s  Clinically dry  Will place on IVF hydration  Hold nephrotoxic agents  Check FeNa  Renal imaging as clinically indicated    Chronic diastolic CHF (congestive heart failure) (HCC) 2D echo August 2021 with EF of 45 to 50% Clinically dry Gently hydrate Monitor volume status  Fall Fall at home in the setting of weakness and UTI CT head stable Fall precautions PT OT evaluation Anticipate rehab  Anemia Hgb stable at 13   CAD S/P percutaneous coronary  angioplasty Remote history of CAD status post stenting No active chest pain Troponin negative x 2 EKG stable Monitor  Essential hypertension Lower limit of normal blood pressure on presentation Hold BP meds for now Reassess as appropriate  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 Patient underwent radical vulvectomy and bilateral groin dissection on 05/02/2012. Patient returned to the OR on 06/25/2014 for right and left vulvectomy, removal of periurethral lesion Has surveillance with Ob-Gyn in the [facility] system      Greater than 50% was spent in counseling and coordination of care with patient Total encounter time 80 minutes or more   Advance Care Planning:   Code Status: Full Code   Consults: None   Family Communication: No family at the bedside   Severity of Illness: The appropriate patient status for this patient is INPATIENT. Inpatient status is judged to be reasonable and necessary in order to provide the required intensity of service to ensure the patient's safety. The patient's presenting symptoms, physical exam findings, and initial radiographic and laboratory data in the context of their chronic comorbidities is felt to place them at high risk for further clinical deterioration. Furthermore, it is not anticipated that the patient will be medically stable for discharge from the hospital within 2 midnights of admission.   * I certify that at the point of admission it is my clinical judgment that the patient will require inpatient hospital care spanning beyond 2 midnights from the point of admission due to high intensity of service, high risk for further deterioration and high frequency of surveillance required.*  Author: Floydene Flock, MD 05/13/2023 5:38 PM  For on call review [email or web address].

## 2023-05-13 NOTE — ED Provider Notes (Signed)
82 year old female with history of hypertension, obesity, heart failure, here with fall and hip pain.  Patient also generally weak today.  Lab work shows significant AKI with elevated BUN to creatinine rate.  Plain films of the hip concerning for possible fracture, CT scan is pending.  Plan to admit for dehydration and weakness as well as possible orthopedic evaluation pending CT results.  CT results showed no fracture.  Urinalysis shows UTI.  Will admit for dehydration and UTI.   Shaune Pollack, MD 05/13/23 [phone]

## 2023-05-13 NOTE — Assessment & Plan Note (Signed)
Remote history of CAD status post stenting No active chest pain Troponin negative x 2 EKG stable Monitor

## 2023-05-13 NOTE — Assessment & Plan Note (Signed)
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 Patient underwent radical vulvectomy and bilateral groin dissection on 05/02/2012. Patient returned to the OR on 06/25/2014 for right and left vulvectomy, removal of periurethral lesion Has surveillance with Ob-Gyn in the [facility] system

## 2023-05-13 NOTE — ED Triage Notes (Signed)
Patient comes in from home via [facility] with complaints of falls. According to EMS, the patient fell out of the bed twice this morning. She states that she was trying to get up, and rolled out of the bed and onto the floor both times. Pt complains of right shoulder, hip, ankle pain. Pt is a cancer pt, and has a history of diabetes and copd. Pt is alert and oriented x4, and with no signs of distress at this time.

## 2023-05-13 NOTE — ED Notes (Signed)
Patient transported to X-ray 

## 2023-05-13 NOTE — ED Notes (Signed)
ED TO INPATIENT HANDOFF REPORT  ED Nurse Name and Phone #: Delice Bison, RN  S Name/Age/Gender Carly Cortez 82 y.o. female Room/Bed: ED07A/ED07A  Code Status   Code Status: Full Code  Home/SNF/Other Home Patient oriented to: self, place, time, and situation Is this baseline? Yes   Triage Complete: Triage complete  Chief Complaint UTI (urinary tract infection) [N39.0]  Triage Note Patient comes in from home via [facility] with complaints of falls. According to EMS, the patient fell out of the bed twice this morning. She states that she was trying to get up, and rolled out of the bed and onto the floor both times. Pt complains of right shoulder, hip, ankle pain. Pt is a cancer pt, and has a history of diabetes and copd. Pt is alert and oriented x4, and with no signs of distress at this time.   Allergies Allergies  Allergen Reactions   Contrast Media [Iodinated Contrast Media] Swelling   Colchicine Other (See Comments)    Gallbladder Problems   Etodolac Swelling   Indomethacin Swelling   Oxaprozin Swelling    Level of Care/Admitting Diagnosis ED Disposition     ED Disposition  Admit   Condition  --   Comment  Hospital Area: [facility] [[id number]]  Level of Care: Med-Surg [16]  Covid Evaluation: Confirmed COVID Negative  Diagnosis: UTI (urinary tract infection) [528413]  Admitting Physician: Floydene Flock [[id number]]  Attending Physician: Floydene Flock [id number]  Certification:: I certify this patient will need inpatient services for at least 2 midnights  Expected Medical Readiness: 05/16/2023          B Medical/Surgery History Past Medical History:  Diagnosis Date   Cancer (HCC)    vaginal   Chronic kidney disease    Diabetes mellitus without complication (HCC)    Gout    High cholesterol    Hypertension    Past Surgical History:  Procedure Laterality Date   ABDOMINAL HYSTERECTOMY     APPENDECTOMY     cardiac stents     CHOLECYSTECTOMY      HERNIA REPAIR     PARTIAL COLECTOMY       A IV Location/Drains/Wounds Patient Lines/Drains/Airways Status     Active Line/Drains/Airways     Name Placement date Placement time Site Days   Peripheral IV 05/13/23 20 G Right Antecubital 05/13/23  --  Antecubital  less than 1            Intake/Output Last 24 hours No intake or output data in the 24 hours ending 05/13/23 1730  Labs/Imaging Results for orders placed or performed during the hospital encounter of 05/13/23 (from the past 48 hour(s))  SARS Coronavirus 2 by RT PCR (hospital order, performed in [facility] hospital lab) *cepheid single result test* Anterior Nasal Swab     Status: None   Collection Time: 05/13/23 12:39 PM   Specimen: Anterior Nasal Swab  Result Value Ref Range   SARS Coronavirus 2 by RT PCR NEGATIVE NEGATIVE    Comment: (NOTE) SARS-CoV-2 target nucleic acids are NOT DETECTED.  The SARS-CoV-2 RNA is generally detectable in upper and lower respiratory specimens during the acute phase of infection. The lowest concentration of SARS-CoV-2 viral copies this assay can detect is 250 copies / mL. A negative result does not preclude SARS-CoV-2 infection and should not be used as the sole basis for treatment or other patient management decisions.  A negative result may occur with improper specimen collection / handling, submission of  specimen other than nasopharyngeal swab, presence of viral mutation(s) within the areas targeted by this assay, and inadequate number of viral copies (<250 copies / mL). A negative result must be combined with clinical observations, patient history, and epidemiological information.  Fact Sheet for Patients:   RoadLapTop.co.za  Fact Sheet for Healthcare Providers: http://kim-miller.com/  This test is not yet approved or  cleared by the [place] FDA and has been authorized for detection and/or diagnosis of SARS-CoV-2 by FDA under  an Emergency Use Authorization (EUA).  This EUA will remain in effect (meaning this test can be used) for the duration of the COVID-19 declaration under Section 564(b)(1) of the Act, 21 U.S.C. section 360bbb-3(b)(1), unless the authorization is terminated or revoked sooner.  Performed at [facility], [address] [place], [place] [address]   CBC with Differential     Status: Abnormal   Collection Time: 05/13/23 12:48 PM  Result Value Ref Range   WBC 11.0 (H) 4.0 - 10.5 K/uL   RBC 4.71 3.87 - 5.11 MIL/uL   Hemoglobin 13.2 12.0 - 15.0 g/dL   HCT 60.4 54.0 - 98.1 %   MCV 86.6 80.0 - 100.0 fL   MCH 28.0 26.0 - 34.0 pg   MCHC 32.4 30.0 - 36.0 g/dL   RDW Not Measured 19.1 - 15.5 %   Platelets 272 150 - 400 K/uL   nRBC 0.0 0.0 - 0.2 %   Neutrophils Relative % 80 %   Neutro Abs 8.8 (H) 1.7 - 7.7 K/uL   Lymphocytes Relative 11 %   Lymphs Abs 1.2 0.7 - 4.0 K/uL   Monocytes Relative 7 %   Monocytes Absolute 0.8 0.1 - 1.0 K/uL   Eosinophils Relative 1 %   Eosinophils Absolute 0.1 0.0 - 0.5 K/uL   Basophils Relative 1 %   Basophils Absolute 0.1 0.0 - 0.1 K/uL   WBC Morphology MORPHOLOGY UNREMARKABLE    Smear Review Normal platelet morphology    Immature Granulocytes 0 %   Abs Immature Granulocytes 0.03 0.00 - 0.07 K/uL   Dimorphism PRESENT     Comment: Performed at [facility], [address] [place], [place] [address]  Comprehensive metabolic panel     Status: Abnormal   Collection Time: 05/13/23 12:48 PM  Result Value Ref Range   Sodium 135 135 - 145 mmol/L   Potassium 4.5 3.5 - 5.1 mmol/L   Chloride 104 98 - 111 mmol/L   CO2 21 (L) 22 - 32 mmol/L   Glucose, Bld 176 (H) 70 - 99 mg/dL    Comment: Glucose reference range applies only to samples taken after fasting for at least 8 hours.   BUN 40 (H) 8 - 23 mg/dL   Creatinine, Ser 5.62 (H) 0.44 - 1.00 mg/dL   Calcium 9.5 8.9 - 13.0 mg/dL   Total Protein 7.4 6.5 - 8.1 g/dL   Albumin 3.5 3.5 - 5.0 g/dL    AST 25 15 - 41 U/L   ALT 10 0 - 44 U/L   Alkaline Phosphatase 63 38 - 126 U/L   Total Bilirubin 0.8 0.3 - 1.2 mg/dL   GFR, Estimated 22 (L) >60 mL/min    Comment: (NOTE) Calculated using the CKD-EPI Creatinine Equation (2021)    Anion gap 10 5 - 15    Comment: Performed at [facility], [address], [place], [place] [address]  Ammonia     Status: None   Collection Time: 05/13/23 12:55 PM  Result Value  Ref Range   Ammonia <10 9 - 35 umol/L    Comment: Performed at [facility], [address] [place], [place] [address]  Blood gas, venous     Status: Abnormal (Preliminary result)   Collection Time: 05/13/23  1:10 PM  Result Value Ref Range   pH, Ven 7.35 7.25 - 7.43   pCO2, Ven 43 (L) 44 - 60 mmHg   pO2, Ven PENDING 32 - 45 mmHg   Bicarbonate 23.7 20.0 - 28.0 mmol/L   Acid-base deficit 2.0 0.0 - 2.0 mmol/L   O2 Saturation 21.4 %   Patient temperature 37.0    Collection site VENOUS     Comment: Performed at [facility], [address] [place], [place] [address]  Urinalysis, Routine w reflex microscopic -Urine, Clean Catch     Status: Abnormal   Collection Time: 05/13/23  1:11 PM  Result Value Ref Range   Color, Urine YELLOW (A) YELLOW   APPearance CLOUDY (A) CLEAR   Specific Gravity, Urine 1.019 1.005 - 1.030   pH 5.0 5.0 - 8.0   Glucose, UA NEGATIVE NEGATIVE mg/dL   Hgb urine dipstick MODERATE (A) NEGATIVE   Bilirubin Urine NEGATIVE NEGATIVE   Ketones, ur NEGATIVE NEGATIVE mg/dL   Protein, ur 30 (A) NEGATIVE mg/dL   Nitrite POSITIVE (A) NEGATIVE   Leukocytes,Ua LARGE (A) NEGATIVE   RBC / HPF 6-10 0 - 5 RBC/hpf   WBC, UA >50 0 - 5 WBC/hpf   Bacteria, UA MANY (A) NONE SEEN   Squamous Epithelial / HPF 0-5 0 - 5 /HPF   WBC Clumps PRESENT    Mucus PRESENT    Hyaline Casts, UA PRESENT     Comment: Performed at [facility], [address] [place], [place] [address]  Troponin I (High Sensitivity)     Status: None   Collection  Time: 05/13/23  1:47 PM  Result Value Ref Range   Troponin I (High Sensitivity) 8 <18 ng/L    Comment: (NOTE) Elevated high sensitivity troponin I (hsTnI) values and significant  changes across serial measurements may suggest ACS but many other  chronic and acute conditions are known to elevate hsTnI results.  Refer to the "Links" section for chest pain algorithms and additional  guidance. Performed at [facility], [address] [place], [place] [address]   Troponin I (High Sensitivity)     Status: None   Collection Time: 05/13/23  3:47 PM  Result Value Ref Range   Troponin I (High Sensitivity) 7 <18 ng/L    Comment: (NOTE) Elevated high sensitivity troponin I (hsTnI) values and significant  changes across serial measurements may suggest ACS but many other  chronic and acute conditions are known to elevate hsTnI results.  Refer to the "Links" section for chest pain algorithms and additional  guidance. Performed at [facility], [address], [place], [place] [address]    CT Hip Right Wo Contrast  Result Date: 05/13/2023 CLINICAL DATA:  Fall and right hip pain. EXAM: CT OF THE RIGHT HIP WITHOUT CONTRAST TECHNIQUE: Multidetector CT imaging of the right hip was performed according to the standard protocol. Multiplanar CT image reconstructions were also generated. RADIATION DOSE REDUCTION: This exam was performed according to the departmental dose-optimization program which includes automated exposure control, adjustment of the mA and/or kV according to patient size and/or use of iterative reconstruction technique. COMPARISON:  Right hip radiograph dated 05/13/2023. FINDINGS: Bones/Joint/Cartilage No definite acute fracture. Cortical irregularity of the superior  right femoral neck likely chronic. A nondisplaced fracture is less likely. MRI may provide better evaluation if there is high clinical concern for acute fracture. There is no dislocation. The bones are  osteopenic. There is mild arthritic changes of the right hip. No joint effusion. Ligaments Suboptimally assessed by CT. Muscles and Tendons No acute findings.  No intramuscular fluid collection or hematoma. Soft tissues Sigmoid diverticulosis. IMPRESSION: 1. No definite acute fracture or dislocation. 2. Osteopenia and mild arthritic changes of the right hip. Electronically Signed   By: Elgie Collard M.D.   On: 05/13/2023 16:39   DG Hip Unilat W or Wo Pelvis 2-3 Views Right  Result Date: 05/13/2023 CLINICAL DATA:  Status post fall, right hip pain EXAM: DG HIP (WITH OR WITHOUT PELVIS) 2-3V RIGHT COMPARISON:  None Available. FINDINGS: Generalized osteopenia. Slight cortical irregularity along the right superior femoral head-neck junction concerning for a subtle nondisplaced fracture. No other acute fracture or dislocation. No aggressive osseous lesion. Normal alignment. Peripheral vascular atherosclerotic disease. No soft tissue abnormality. No radiopaque foreign body or soft tissue emphysema. IMPRESSION: 1. Slight cortical irregularity along the right superior femoral head-neck junction concerning for a subtle nondisplaced fracture. Given the patient's age and osteopenia, recommend a CT of the right hip for further evaluation. Electronically Signed   By: Elige Ko M.D.   On: 05/13/2023 15:01   DG Shoulder Right  Result Date: 05/13/2023 CLINICAL DATA:  Status post fall, right shoulder pain EXAM: RIGHT SHOULDER - 2+ VIEW COMPARISON:  None Available. FINDINGS: No acute fracture or dislocation. No aggressive osseous lesion. Normal alignment. Generalized osteopenia. Mild arthropathy of the acromioclavicular joint. Soft tissue are unremarkable. No radiopaque foreign body or soft tissue emphysema. IMPRESSION: 1. No acute osseous injury of the right shoulder. Electronically Signed   By: Elige Ko M.D.   On: 05/13/2023 14:59   CT HEAD WO CONTRAST  Result Date: 05/13/2023 CLINICAL DATA:  Altered mental status  EXAM: CT HEAD WITHOUT CONTRAST TECHNIQUE: Contiguous axial images were obtained from the base of the skull through the vertex without intravenous contrast. RADIATION DOSE REDUCTION: This exam was performed according to the departmental dose-optimization program which includes automated exposure control, adjustment of the mA and/or kV according to patient size and/or use of iterative reconstruction technique. COMPARISON:  CT Head 08/14/22 FINDINGS: Brain: No evidence of acute infarction, hemorrhage, hydrocephalus, extra-axial collection or mass lesion/mass effect.Sequela of mild chronic microvascular ischemic change. Mild dural thickening in the right middle cranial fossa, unchanged from prior. Vascular: No hyperdense vessel or unexpected calcification. Skull: Normal. Negative for fracture or focal lesion. Sinuses/Orbits: No acute finding. Other: None. IMPRESSION: No acute intracranial abnormality. Electronically Signed   By: Lorenza Cambridge M.D.   On: 05/13/2023 14:49   DG Chest Portable 1 View  Result Date: 05/13/2023 CLINICAL DATA:  Weakness EXAM: PORTABLE CHEST 1 VIEW COMPARISON:  Chest x-ray dated August 14, 2022 FINDINGS: Cardiac and mediastinal contours are unchanged. Moderate-to-large hiatal hernia. Mild bibasilar opacities. No large pleural effusion or evidence of pneumothorax. IMPRESSION: 1. Mild bibasilar opacities, likely atelectasis. 2. Moderate-to-large hiatal hernia. Electronically Signed   By: Allegra Lai M.D.   On: 05/13/2023 14:12    Pending Labs Unresulted Labs (From admission, onward)     Start     Ordered   05/14/23 0500  CBC  Tomorrow morning,   R        05/13/23 1724   05/14/23 0500  Comprehensive metabolic panel  Tomorrow morning,   R  05/13/23 1724   05/13/23 1705  Urine Culture  Once,   URGENT       Question:  Indication  Answer:  Dysuria   05/13/23 1704            Vitals/Pain Today's Vitals   05/13/23 1402 05/13/23 1430 05/13/23 1445 05/13/23 1700  BP:     138/66  Pulse:  81 72 75  Resp:    20  Temp:    (!) 97 F (36.1 C)  TempSrc:    Oral  SpO2: 96% 95% 96% 98%  Weight:      Height:      PainSc:        Isolation Precautions Airborne and Contact precautions  Medications Medications  cefTRIAXone (ROCEPHIN) 1 g in sodium chloride 0.9 % 100 mL IVPB (1 g Intravenous New Bag/Given 05/13/23 1723)  aspirin EC tablet 81 mg (has no administration in time range)  isosorbide mononitrate (IMDUR) 24 hr tablet 60 mg (has no administration in time range)  insulin glargine-yfgn (SEMGLEE) injection 20 Units (has no administration in time range)  pantoprazole (PROTONIX) EC tablet 40 mg (has no administration in time range)  pregabalin (LYRICA) capsule 75 mg (has no administration in time range)  enoxaparin (LOVENOX) injection 40 mg (has no administration in time range)  0.9 %  sodium chloride infusion (has no administration in time range)  ondansetron (ZOFRAN) tablet 4 mg (has no administration in time range)    Or  ondansetron (ZOFRAN) injection 4 mg (has no administration in time range)  cefTRIAXone (ROCEPHIN) 1 g in sodium chloride 0.9 % 100 mL IVPB (has no administration in time range)  sodium chloride 0.9 % bolus 500 mL (0 mLs Intravenous Stopped 05/13/23 1523)    Mobility walks with device     Focused Assessments Cardiac Assessment Handoff:    Lab Results  Component Value Date   CKTOTAL 91 08/13/2014   CKMB 1.5 08/13/2014   TROPONINI <0.03 12/10/2018   No results found for: "DDIMER" Does the Patient currently have chest pain? No    R Recommendations: See Admitting Provider Note  Report given to:   Additional Notes:

## 2023-05-13 NOTE — Assessment & Plan Note (Addendum)
Progressive weakness in the setting of acute UTI Noted active vulvar cancer, dehydration or confounding issues Nonfocal exam IV fluid hydration PT OT evaluation Follow precautions Monitor

## 2023-05-13 NOTE — Assessment & Plan Note (Signed)
Fall at home in the setting of weakness and UTI CT head stable Fall precautions PT OT evaluation Anticipate rehab

## 2023-05-13 NOTE — Assessment & Plan Note (Signed)
2D echo August 2021 with EF of 45 to 50% Clinically dry Gently hydrate Monitor volume status

## 2023-05-13 NOTE — Assessment & Plan Note (Signed)
Lower limit of normal blood pressure on presentation Hold BP meds for now Reassess as appropriate

## 2023-05-14 DIAGNOSIS — N390 Urinary tract infection, site not specified: Principal | ICD-10-CM

## 2023-05-14 DIAGNOSIS — I10 Essential (primary) hypertension: Secondary | ICD-10-CM

## 2023-05-14 DIAGNOSIS — N179 Acute kidney failure, unspecified: Secondary | ICD-10-CM | POA: Diagnosis not present

## 2023-05-14 DIAGNOSIS — N189 Chronic kidney disease, unspecified: Secondary | ICD-10-CM

## 2023-05-14 DIAGNOSIS — W19XXXA Unspecified fall, initial encounter: Secondary | ICD-10-CM

## 2023-05-14 DIAGNOSIS — I2510 Atherosclerotic heart disease of native coronary artery without angina pectoris: Secondary | ICD-10-CM

## 2023-05-14 DIAGNOSIS — I5032 Chronic diastolic (congestive) heart failure: Secondary | ICD-10-CM

## 2023-05-14 DIAGNOSIS — R531 Weakness: Secondary | ICD-10-CM | POA: Diagnosis not present

## 2023-05-14 DIAGNOSIS — Z9861 Coronary angioplasty status: Secondary | ICD-10-CM

## 2023-05-14 DIAGNOSIS — Z8544 Personal history of malignant neoplasm of other female genital organs: Secondary | ICD-10-CM

## 2023-05-14 LAB — COMPREHENSIVE METABOLIC PANEL
ALT: 9 U/L (ref 0–44)
AST: 15 U/L (ref 15–41)
Albumin: 3 g/dL — ABNORMAL LOW (ref 3.5–5.0)
Alkaline Phosphatase: 51 U/L (ref 38–126)
Anion gap: 7 (ref 5–15)
BUN: 36 mg/dL — ABNORMAL HIGH (ref 8–23)
CO2: 23 mmol/L (ref 22–32)
Calcium: 8.5 mg/dL — ABNORMAL LOW (ref 8.9–10.3)
Chloride: 108 mmol/L (ref 98–111)
Creatinine, Ser: 2.07 mg/dL — ABNORMAL HIGH (ref 0.44–1.00)
GFR, Estimated: 24 mL/min — ABNORMAL LOW (ref >60)
Glucose, Bld: 151 mg/dL — ABNORMAL HIGH (ref 70–99)
Potassium: 3.9 mmol/L (ref 3.5–5.1)
Sodium: 138 mmol/L (ref 135–145)
Total Bilirubin: 0.5 mg/dL (ref 0.3–1.2)
Total Protein: 6 g/dL — ABNORMAL LOW (ref 6.5–8.1)

## 2023-05-14 LAB — GLUCOSE, CAPILLARY
Glucose-Capillary: 110 mg/dL — ABNORMAL HIGH (ref 70–99)
Glucose-Capillary: 205 mg/dL — ABNORMAL HIGH (ref 70–99)
Glucose-Capillary: 195 mg/dL — ABNORMAL HIGH (ref 70–99)
Glucose-Capillary: 185 mg/dL — ABNORMAL HIGH (ref 70–99)

## 2023-05-14 LAB — CBC
HCT: 35.2 % — ABNORMAL LOW (ref 36.0–46.0)
Hemoglobin: 11.3 g/dL — ABNORMAL LOW (ref 12.0–15.0)
MCH: 28.5 pg (ref 26.0–34.0)
MCHC: 32.1 g/dL (ref 30.0–36.0)
MCV: 88.7 fL (ref 80.0–100.0)
Platelets: 224 10*3/uL (ref 150–400)
RBC: 3.97 MIL/uL (ref 3.87–5.11)
WBC: 6.3 10*3/uL (ref 4.0–10.5)
nRBC: 0 % (ref 0.0–0.2)

## 2023-05-14 LAB — HEMOGLOBIN A1C
Hgb A1c MFr Bld: 6.7 % — ABNORMAL HIGH (ref 4.8–5.6)
Mean Plasma Glucose: 145.59 mg/dL

## 2023-05-14 MED ORDER — ENOXAPARIN SODIUM 30 MG/0.3ML IJ SOSY
30.0000 mg | PREFILLED_SYRINGE | INTRAMUSCULAR | Status: DC
  Administered 2023-05-14: 30 mg via SUBCUTANEOUS
  Filled 2023-05-14: qty 0.3

## 2023-05-14 NOTE — Progress Notes (Signed)
PHARMACIST - PHYSICIAN COMMUNICATION  CONCERNING:  Enoxaparin (Lovenox) for DVT Prophylaxis    RECOMMENDATION: Patient was prescribed enoxaprin 40mg  q24 hours for VTE prophylaxis.   Filed Weights   05/13/23 1238  Weight: 88.5 kg (195 lb)    Body mass index is 31.47 kg/m.  Estimated Creatinine Clearance: 23.9 mL/min (A) (by C-G formula based on SCr of 2.07 mg/dL (H)).   Patient is candidate for enoxaparin 30mg  every 24 hours based on CrCl <16ml/min or Weight <45kg  DESCRIPTION: Pharmacy has adjusted enoxaparin dose per [facility] policy.  Patient is now receiving enoxaparin 30 mg every 24 hours    Elliot Gurney, PharmD, BCPS Clinical Pharmacist  05/14/2023 12:28 PM

## 2023-05-14 NOTE — Plan of Care (Signed)
  Problem: Health Behavior/Discharge Planning: Goal: Ability to manage health-related needs will improve Outcome: Progressing   Problem: Clinical Measurements: Goal: Diagnostic test results will improve Outcome: Progressing Goal: Respiratory complications will improve Outcome: Progressing Goal: Cardiovascular complication will be avoided Outcome: Progressing   Problem: Activity: Goal: Risk for activity intolerance will decrease Outcome: Progressing   Problem: Nutrition: Goal: Adequate nutrition will be maintained Outcome: Progressing   Problem: Elimination: Goal: Will not experience complications related to bowel motility Outcome: Progressing Goal: Will not experience complications related to urinary retention Outcome: Progressing   Problem: Pain Managment: Goal: General experience of comfort will improve Outcome: Progressing   Problem: Safety: Goal: Ability to remain free from injury will improve Outcome: Progressing

## 2023-05-14 NOTE — Progress Notes (Signed)
Patient was taken to the bathroom, she began to feel weak in her legs. Nurse lowered patient to the floor and help was called. Patient was placed back into bed via hoyer lift with assistance from occupational therapy.

## 2023-05-14 NOTE — Evaluation (Signed)
Occupational Therapy Evaluation Patient Details Name: Carly Cortez MRN: [id number] DOB: Jun 04, 1941 Today's Date: 05/14/2023   History of Present Illness Carly Cortez is a 82 y.o. female with medical history significant of vulvar cancer, stage III CKD, type 2 diabetes, hypertension, hyperlipidemia, HFpEF presenting with fall, weakness, UTI, acute on chronic kidney disease.   Clinical Impression   Carly Cortez was seen for OT/PT co-evaluation this date. Pt pleasantly confused t/o session, reports she was independent prior to hospital admission, this is confirmed by chart review. Pt lives with her spouse in a 1 level home with 3 STE. Pt presents to acute OT demonstrating impaired ADL performance and functional mobility 2/2 decreased cognition, decreased balance, generalized weakness, and pain (See OT problem list for additional functional deficits). Pt currently requires MAX A +2 for all bed/functional mobility attempts. Anticipate MAX A +2 for LB dressing from STS, MIN A for UB ADL management while seated.  Pt would benefit from skilled OT services to address noted impairments and functional limitations (see below for any additional details) in order to maximize safety and independence while minimizing falls risk and caregiver burden. Anticipate the need for follow up therapy services upon acute hospital DC ( </= 3 hours /day).        If plan is discharge home, recommend the following: Two people to help with walking and/or transfers;Two people to help with bathing/dressing/bathroom;Assistance with cooking/housework;Assist for transportation;Help with stairs or ramp for entrance;Assistance with feeding;Supervision due to cognitive status    Functional Status Assessment  Patient has had a recent decline in their functional status and demonstrates the ability to make significant improvements in function in a reasonable and predictable amount of time.  Equipment Recommendations   (defer to next venue of  care.)    Recommendations for Other Services       Precautions / Restrictions Precautions Precautions: Fall Restrictions Weight Bearing Restrictions: No      Mobility Bed Mobility Overal bed mobility: Needs Assistance Bed Mobility: Supine to Sit, Sit to Supine     Supine to sit: HOB elevated, +2 for physical assistance, Max assist Sit to supine: +2 for physical assistance, Max assist        Transfers Overall transfer level: Needs assistance   Transfers: Sit to/from Stand Sit to Stand: +2 physical assistance, Max assist                  Balance Overall balance assessment: Needs assistance Sitting-balance support: Feet supported, No upper extremity supported, Single extremity supported Sitting balance-Leahy Scale: Fair Sitting balance - Comments: steady static sitting at EOB. LOB appreciated with weight shift, pt able to self-correct.   Standing balance support: Reliant on assistive device for balance, During functional activity, Bilateral upper extremity supported Standing balance-Leahy Scale: Zero Standing balance comment: Heavy assist required to maintain standing balance. Pt noted to lean toward R side when standing.                           ADL either performed or assessed with clinical judgement   ADL Overall ADL's : Needs assistance/impaired                                       General ADL Comments: Pt functionally limited by cognition, generalized weakness, and decreased safety awareness. Requires SUPERVISION for self feeding as pt noted with partially  chewed bolus of pot roast in mouth which she repeatedly takes out of her mouth and then puts back in attempting to chew without her dentures. +2 MAX A with bed mobility and standing attempt. Unable to attempt functional mobility as pt is significantly pain limited and requires MAX A to maintain standing balance at EOB. Anticipate +2 MAX A for SPT to [facility] for toileting, but may  benefit from hoyer lift for safety given falls hx.     Vision Patient Visual Report: No change from baseline       Perception         Praxis         Pertinent Vitals/Pain Pain Assessment Pain Assessment: Faces Faces Pain Scale: Hurts whole lot Pain Location: RLE with movement. Pain Descriptors / Indicators: Grimacing, Guarding, Sore Pain Intervention(s): Limited activity within patient's tolerance, Monitored during session, Repositioned     Extremity/Trunk Assessment Upper Extremity Assessment Upper Extremity Assessment: Generalized weakness;Right hand dominant   Lower Extremity Assessment Lower Extremity Assessment: Defer to PT evaluation;Generalized weakness;RLE deficits/detail RLE Deficits / Details: Pt endorses increased pain with RLE movement.       Communication Communication Communication: Difficulty following commands/understanding Following commands: Follows one step commands with increased time Cueing Techniques: Verbal cues;Tactile cues   Cognition Arousal: Alert Behavior During Therapy: WFL for tasks assessed/performed Overall Cognitive Status: No family/caregiver present to determine baseline cognitive functioning                                 General Comments: Pt oriented to self and limited situation, per nsg has been pleasantly confused. Requires increased time/cueing to follow 1 step VCs during session. Decreased safety awareness.     General Comments       Exercises Other Exercises Other Exercises: Pt educated on role of OT in acute setting, safe transfer techniques, falls prevention strategies.   Shoulder Instructions      Home Living Family/patient expects to be discharged to:: Private residence Living Arrangements: Spouse/significant other Available Help at Discharge: Family Type of Home: House Home Access: Stairs to enter Secretary/administrator of Steps: 3 Entrance Stairs-Rails: Right;Left;Can reach both Home Layout:  One level     Bathroom Shower/Tub: Producer, television/film/video: Standard     Home Equipment: Agricultural consultant (2 wheels);BSC/3in1   Additional Comments: Pt pleasantly confused t/o session, information regarding home set-up/PLOF primarily obtained through chart review.      Prior Functioning/Environment Prior Level of Function : Independent/Modified Independent             Mobility Comments: Per chart, indep with amb without AD. ADLs Comments: husband assists with iADLs but patient able to independently complete ADLs        OT Problem List: Decreased strength;Decreased coordination;Decreased range of motion;Decreased activity tolerance;Decreased safety awareness;Impaired balance (sitting and/or standing);Decreased knowledge of use of DME or AE      OT Treatment/Interventions: Self-care/ADL training;Therapeutic exercise;Therapeutic activities;DME and/or AE instruction;Patient/family education;Balance training;Energy conservation;Cognitive remediation/compensation    OT Goals(Current goals can be found in the care plan section) Acute Rehab OT Goals Patient Stated Goal: To feel better OT Goal Formulation: With patient Time For Goal Achievement: 05/28/23 Potential to Achieve Goals: Good ADL Goals Pt Will Perform Grooming: sitting;with set-up;with supervision Pt Will Perform Lower Body Dressing: sit to/from stand;with min assist;with adaptive equipment Pt Will Transfer to Toilet: bedside commode;with min assist;stand pivot transfer Pt Will Perform Toileting - Clothing  Manipulation and hygiene: with adaptive equipment;sit to/from stand;with supervision;with set-up  OT Frequency: Min 1X/week    Co-evaluation              AM-PAC OT "6 Clicks" Daily Activity     Outcome Measure Help from another person eating meals?: A Little Help from another person taking care of personal grooming?: A Little Help from another person toileting, which includes using toliet, bedpan, or  urinal?: A Lot Help from another person bathing (including washing, rinsing, drying)?: A Lot Help from another person to put on and taking off regular upper body clothing?: A Lot Help from another person to put on and taking off regular lower body clothing?: A Lot 6 Click Score: 14   End of Session Equipment Utilized During Treatment: Gait belt;Rolling walker (2 wheels)  Activity Tolerance: Patient tolerated treatment well Patient left: in bed;with call bell/phone within reach;with bed alarm set  OT Visit Diagnosis: Other abnormalities of gait and mobility (R26.89);Muscle weakness (generalized) (M62.81)                Time: 5621-3086 OT Time Calculation (min): 17 min Charges:  OT General Charges $OT Visit: 1 Visit OT Evaluation $OT Eval Moderate Complexity: 1 Mod  Rockney Ghee, M.S., OTR/L 05/14/23, 1:42 PM

## 2023-05-14 NOTE — Progress Notes (Signed)
Progress Note   Patient: Carly Cortez:[id number] DOB: 10/30/1940 DOA: 05/13/2023     1 DOS: the patient was seen and examined on 05/14/2023   Brief hospital course: TERRESSA HUTZELL is a 82 y.o. female with medical history significant of vulvar cancer, stage III CKD, type 2 diabetes, hypertension, hyperlipidemia, HFpEF presenting with fall, weakness, UTI, acute on chronic kidney disease reports worsening weakness over the past 1 to 2 days. generalized malaise, decreased p.o. intake. dysuria and increased urinary frequency.  Had falls x 2 admitted to hospitalist service for further management.  Assessment and Plan: * UTI (urinary tract infection) Has weakness, dysuria and urinalysis indicative of infection Continue IV Rocephin Follow urine culture  Generalized weakness Progressive weakness in the setting of acute UTI Noted active vulvar cancer, dehydration or confounding issues Continue gentle IV fluid hydration PT/ OT evaluation Fall precautions precautions Supportive care  Acute kidney injury superimposed on chronic kidney disease (HCC) Baseline Cr 1-1.4 w/ GFR in 30s-50s  Creatinine improving with IV hydration Avoid nephrotoxic agents including Lasix Monitor daily renal function.  Chronic diastolic CHF (congestive heart failure) (HCC) 2D echo August 2021 with EF of 45 to 50% Caution with IV hydration. Hold Lasix for now Monitor volume status.  Fall x 2 In the setting of weakness and UTI CT head stable Fall precautions PT OT evaluation Patient will likely need rehab pending PT evaluation  CAD S/P percutaneous coronary angioplasty Remote history of CAD status post stenting No active chest pain. Will resume GDMT if blood pressure better. Troponin negative x 2  Essential hypertension BP better today. Will resume beta-blocker therapy. Continue Imdur.  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  Patient underwent radical vulvectomy and bilateral groin dissection on 05/02/2012. Patient returned to the OR on 06/25/2014 for right and left vulvectomy, removal of periurethral lesion Has surveillance with Ob-Gyn in the [facility].     Subjective: Patient is seen and examined today morning.  She is lying in bed eating poor, feels weak.  Patient does not have dentures and wishes for soft diet.  No overnight issues.  Physical Exam: Vitals:   05/13/23 1700 05/13/23 1801 05/14/23 0113 05/14/23 0807  BP: 138/66 125/84 120/69 123/63  Pulse: 75 71 77 72  Resp: 20 16 18 16   Temp: (!) 97 F (36.1 C)  98 F (36.7 C) 97.7 F (36.5 C)  TempSrc: Oral     SpO2: 98% 98% 95% 96%  Weight:      Height:       General - Elderly Caucasian ill looking female, no apparent distress HEENT - PERRLA, EOMI, atraumatic head, non tender sinuses. Lung - Clear, diffuse rales Heart - S1, S2 heard, no murmurs, rubs, trace pedal edema Neuro - Alert, awake and oriented, non focal exam. Skin - Warm and dry. Data Reviewed:     Latest Ref Rng & Units 05/14/2023    3:05 AM 05/13/2023   12:48 PM 08/16/2022    5:26 AM  CBC  WBC 4.0 - 10.5 K/uL 6.3  11.0  5.7   Hemoglobin 12.0 - 15.0 g/dL 81.1  91.4  78.2   Hematocrit 36.0 - 46.0 % 35.2  40.8  32.3   Platelets 150 - 400 K/uL 224  272  264       Latest Ref Rng & Units 05/14/2023    3:05 AM 05/13/2023   12:48 PM 08/17/2022    1:51 AM  BMP  Glucose 70 - 99 mg/dL 829  562  130   BUN 8 - 23 mg/dL 36  40  10   Creatinine 0.44 - 1.00 mg/dL 8.65  7.84  6.96   Sodium 135 - 145 mmol/L 138  135  139   Potassium 3.5 - 5.1 mmol/L 3.9  4.5  3.6   Chloride 98 - 111 mmol/L 108  104  110   CO2 22 - 32 mmol/L 23  21  21    Calcium 8.9 - 10.3 mg/dL 8.5  9.5  8.6    CT Hip Right Wo Contrast  Result Date: 05/13/2023 CLINICAL DATA:  Fall and right hip pain. EXAM: CT OF THE RIGHT HIP WITHOUT CONTRAST TECHNIQUE: Multidetector CT imaging of the right hip was performed according to the  standard protocol. Multiplanar CT image reconstructions were also generated. RADIATION DOSE REDUCTION: This exam was performed according to the departmental dose-optimization program which includes automated exposure control, adjustment of the mA and/or kV according to patient size and/or use of iterative reconstruction technique. COMPARISON:  Right hip radiograph dated 05/13/2023. FINDINGS: Bones/Joint/Cartilage No definite acute fracture. Cortical irregularity of the superior right femoral neck likely chronic. A nondisplaced fracture is less likely. MRI may provide better evaluation if there is high clinical concern for acute fracture. There is no dislocation. The bones are osteopenic. There is mild arthritic changes of the right hip. No joint effusion. Ligaments Suboptimally assessed by CT. Muscles and Tendons No acute findings.  No intramuscular fluid collection or hematoma. Soft tissues Sigmoid diverticulosis. IMPRESSION: 1. No definite acute fracture or dislocation. 2. Osteopenia and mild arthritic changes of the right hip. Electronically Signed   By: Elgie Collard M.D.   On: 05/13/2023 16:39   DG Hip Unilat W or Wo Pelvis 2-3 Views Right  Result Date: 05/13/2023 CLINICAL DATA:  Status post fall, right hip pain EXAM: DG HIP (WITH OR WITHOUT PELVIS) 2-3V RIGHT COMPARISON:  None Available. FINDINGS: Generalized osteopenia. Slight cortical irregularity along the right superior femoral head-neck junction concerning for a subtle nondisplaced fracture. No other acute fracture or dislocation. No aggressive osseous lesion. Normal alignment. Peripheral vascular atherosclerotic disease. No soft tissue abnormality. No radiopaque foreign body or soft tissue emphysema. IMPRESSION: 1. Slight cortical irregularity along the right superior femoral head-neck junction concerning for a subtle nondisplaced fracture. Given the patient's age and osteopenia, recommend a CT of the right hip for further evaluation.  Electronically Signed   By: Elige Ko M.D.   On: 05/13/2023 15:01   DG Shoulder Right  Result Date: 05/13/2023 CLINICAL DATA:  Status post fall, right shoulder pain EXAM: RIGHT SHOULDER - 2+ VIEW COMPARISON:  None Available. FINDINGS: No acute fracture or dislocation. No aggressive osseous lesion. Normal alignment. Generalized osteopenia. Mild arthropathy of the acromioclavicular joint. Soft tissue are unremarkable. No radiopaque foreign body or soft tissue emphysema. IMPRESSION: 1. No acute osseous injury of the right shoulder. Electronically Signed   By: Elige Ko M.D.   On: 05/13/2023 14:59   CT HEAD WO CONTRAST  Result Date: 05/13/2023 CLINICAL DATA:  Altered mental status EXAM: CT HEAD WITHOUT CONTRAST TECHNIQUE: Contiguous axial images were obtained from the base of the skull through the vertex without intravenous contrast. RADIATION DOSE REDUCTION: This exam was performed according to the departmental dose-optimization program which includes automated exposure control, adjustment of the mA and/or kV according to patient size and/or use of iterative reconstruction technique. COMPARISON:  CT Head 08/14/22 FINDINGS: Brain: No evidence of acute  infarction, hemorrhage, hydrocephalus, extra-axial collection or mass lesion/mass effect.Sequela of mild chronic microvascular ischemic change. Mild dural thickening in the right middle cranial fossa, unchanged from prior. Vascular: No hyperdense vessel or unexpected calcification. Skull: Normal. Negative for fracture or focal lesion. Sinuses/Orbits: No acute finding. Other: None. IMPRESSION: No acute intracranial abnormality. Electronically Signed   By: Lorenza Cambridge M.D.   On: 05/13/2023 14:49   DG Chest Portable 1 View  Result Date: 05/13/2023 CLINICAL DATA:  Weakness EXAM: PORTABLE CHEST 1 VIEW COMPARISON:  Chest x-ray dated August 14, 2022 FINDINGS: Cardiac and mediastinal contours are unchanged. Moderate-to-large hiatal hernia. Mild bibasilar  opacities. No large pleural effusion or evidence of pneumothorax. IMPRESSION: 1. Mild bibasilar opacities, likely atelectasis. 2. Moderate-to-large hiatal hernia. Electronically Signed   By: Allegra Lai M.D.   On: 05/13/2023 14:12     Family Communication: Patient lives with elderly husband, no one near by. She may need rehab pending PT eval.  Disposition: Status is: Inpatient Remains inpatient appropriate because: IV antibiotic therapy, PT eval, monitor kidney function.  Planned Discharge Destination: Rehab    Time spent: 43 minutes  Author: [name], MD 05/14/2023 2:51 PM  For on call review [email or web address].

## 2023-05-14 NOTE — Plan of Care (Signed)
  Problem: Coping: Goal: Ability to adjust to condition or change in health will improve Outcome: Progressing   Problem: Fluid Volume: Goal: Ability to maintain a balanced intake and output will improve Outcome: Progressing   Problem: Health Behavior/Discharge Planning: Goal: Ability to identify and utilize available resources and services will improve Outcome: Progressing Goal: Ability to manage health-related needs will improve Outcome: Progressing   Problem: Nutritional: Goal: Maintenance of adequate nutrition will improve Outcome: Progressing Goal: Progress toward achieving an optimal weight will improve Outcome: Progressing   Problem: Skin Integrity: Goal: Risk for impaired skin integrity will decrease Outcome: Progressing   Problem: Tissue Perfusion: Goal: Adequacy of tissue perfusion will improve Outcome: Progressing   Problem: Clinical Measurements: Goal: Ability to maintain clinical measurements within normal limits will improve Outcome: Progressing Goal: Will remain free from infection Outcome: Progressing Goal: Diagnostic test results will improve Outcome: Progressing Goal: Respiratory complications will improve Outcome: Progressing Goal: Cardiovascular complication will be avoided Outcome: Progressing   Problem: Coping: Goal: Level of anxiety will decrease Outcome: Progressing   Problem: Elimination: Goal: Will not experience complications related to bowel motility Outcome: Progressing Goal: Will not experience complications related to urinary retention Outcome: Progressing   Problem: Pain Managment: Goal: General experience of comfort will improve Outcome: Progressing

## 2023-05-14 NOTE — Evaluation (Signed)
Physical Therapy Evaluation Patient Details Name: Carly Cortez MRN: [id number] DOB: 01-03-1941 Today's Date: 05/14/2023  History of Present Illness  Patient is a 82 year old female with  presenting with fall, weakness, UTI, acute on chronic kidney disease. History significant of vulvar cancer, stage III CKD, type 2 diabetes, hypertension, hyperlipidemia, HFpEF   Clinical Impression  Patient is agreeable to PT evaluation. She reports she lives with her spouse and is typically independent with ambulation.  Today, the patient has generalized weakness. She requires significant assistance with bed mobility and transfers. +2 person assistance required for standing. Standing balance is poor with right lateral lean. Unable to safely ambulate at this time. Pain reported in the right leg with stiffness with PROM. Recommend to continue PT to maximize independence and facilitate return to prior level of function. Anticipate patient will require rehabilitation after this hospital stay< 3 hours a day.       If plan is discharge home, recommend the following: Two people to help with walking and/or transfers;A lot of help with bathing/dressing/bathroom;Assist for transportation;Help with stairs or ramp for entrance;Assistance with cooking/housework   Can travel by private vehicle   No    Equipment Recommendations  (to be determined at next level of care)  Recommendations for Other Services       Functional Status Assessment Patient has had a recent decline in their functional status and demonstrates the ability to make significant improvements in function in a reasonable and predictable amount of time.     Precautions / Restrictions Precautions Precautions: Fall Restrictions Weight Bearing Restrictions: No      Mobility  Bed Mobility Overal bed mobility: Needs Assistance Bed Mobility: Supine to Sit, Sit to Supine     Supine to sit: HOB elevated, +2 for physical assistance, Max assist Sit to  supine: +2 for physical assistance, Max assist        Transfers Overall transfer level: Needs assistance   Transfers: Sit to/from Stand Sit to Stand: Max assist, +2 physical assistance           General transfer comment: verbal cues for technique    Ambulation/Gait               General Gait Details: unable to ambulate safely at this time due to significant weakness, poor standing tolerance  Stairs            Wheelchair Mobility     Tilt Bed    Modified Rankin (Stroke Patients Only)       Balance Overall balance assessment: Needs assistance Sitting-balance support: Feet supported, No upper extremity supported, Single extremity supported Sitting balance-Leahy Scale: Fair Sitting balance - Comments: loss of balance with dynamic activity that is self corrected Postural control: Right lateral lean Standing balance support: Reliant on assistive device for balance, During functional activity, Bilateral upper extremity supported Standing balance-Leahy Scale: Zero Standing balance comment: heavy relaince on rolling walker for support. +2 person assistance required with right lateral lean                             Pertinent Vitals/Pain Pain Assessment Pain Assessment: Faces Faces Pain Scale: Hurts whole lot Pain Location: RLE with movement. Pain Descriptors / Indicators: Grimacing, Guarding, Sore Pain Intervention(s): Limited activity within patient's tolerance, Monitored during session, Repositioned    Home Living Family/patient expects to be discharged to:: Private residence Living Arrangements: Spouse/significant other Available Help at Discharge: Family Type of Home:  House Home Access: Stairs to enter Entrance Stairs-Rails: Right;Left;Can reach both Secretary/administrator of Steps: 3   Home Layout: One level Home Equipment: Agricultural consultant (2 wheels);BSC/3in1 Additional Comments: Pt pleasantly confused t/o session, information  regarding home set-up/PLOF primarily obtained through chart review.    Prior Function Prior Level of Function : Independent/Modified Independent;History of Falls (last six months);Patient poor historian/Family not available             Mobility Comments: independent without device per chair. ADLs Comments: husband assists with iADLs but patient able to independently complete ADLs     Extremity/Trunk Assessment   Upper Extremity Assessment Upper Extremity Assessment: Generalized weakness    Lower Extremity Assessment Lower Extremity Assessment: Generalized weakness;RLE deficits/detail RLE Deficits / Details: patient reports R leg pain with movement, unable to state where. stiffness with PROM       Communication   Communication Communication: Difficulty following commands/understanding Following commands: Follows one step commands with increased time Cueing Techniques: Verbal cues;Tactile cues  Cognition Arousal: Alert Behavior During Therapy: WFL for tasks assessed/performed Overall Cognitive Status: No family/caregiver present to determine baseline cognitive functioning                                 General Comments: patient is pleasantly confused but able to follow single step commands with increased time.        General Comments      Exercises     Assessment/Plan    PT Assessment Patient needs continued PT services  PT Problem List Decreased strength;Decreased activity tolerance;Decreased balance;Decreased range of motion;Decreased mobility;Decreased cognition       PT Treatment Interventions DME instruction;Gait training;Stair training;Functional mobility training;Therapeutic activities;Therapeutic exercise;Balance training;Neuromuscular re-education;Cognitive remediation;Patient/family education    PT Goals (Current goals can be found in the Care Plan section)  Acute Rehab PT Goals Patient Stated Goal: to get stronger PT Goal Formulation:  With patient Time For Goal Achievement: 05/28/23 Potential to Achieve Goals: Good    Frequency Min 1X/week     Co-evaluation PT/OT/SLP Co-Evaluation/Treatment: Yes Reason for Co-Treatment: To address functional/ADL transfers PT goals addressed during session: Mobility/safety with mobility         AM-PAC PT "6 Clicks" Mobility  Outcome Measure Help needed turning from your back to your side while in a flat bed without using bedrails?: A Lot Help needed moving from lying on your back to sitting on the side of a flat bed without using bedrails?: Total Help needed moving to and from a bed to a chair (including a wheelchair)?: Total Help needed standing up from a chair using your arms (e.g., wheelchair or bedside chair)?: Total Help needed to walk in hospital room?: Total Help needed climbing 3-5 steps with a railing? : Total 6 Click Score: 7    End of Session   Activity Tolerance: Patient tolerated treatment well Patient left: in bed;with call bell/phone within reach;with bed alarm set;with SCD's reapplied Nurse Communication: Mobility status PT Visit Diagnosis: Unsteadiness on feet (R26.81);Muscle weakness (generalized) (M62.81)    Time: 8295-6213 PT Time Calculation (min) (ACUTE ONLY): 17 min   Charges:   PT Evaluation $PT Eval Low Complexity: 1 Low   PT General Charges $$ ACUTE PT VISIT: 1 Visit         Donna Bernard, PT, MPT  [name] 05/14/2023, 1:58 PM

## 2023-05-14 NOTE — Progress Notes (Signed)
Patient called daughter and stated she had fell again.Nurse answered daughter's call and stated that patient did not fall but was lowered to the floor and was placed back in the bed by hoyer lift. Nurse and daughter and daughter is aware that patient has confusion from UTI

## 2023-05-15 DIAGNOSIS — R531 Weakness: Secondary | ICD-10-CM | POA: Diagnosis not present

## 2023-05-15 DIAGNOSIS — N179 Acute kidney failure, unspecified: Secondary | ICD-10-CM | POA: Diagnosis not present

## 2023-05-15 DIAGNOSIS — N390 Urinary tract infection, site not specified: Secondary | ICD-10-CM | POA: Diagnosis not present

## 2023-05-15 DIAGNOSIS — W19XXXA Unspecified fall, initial encounter: Secondary | ICD-10-CM | POA: Diagnosis not present

## 2023-05-15 LAB — BASIC METABOLIC PANEL
Anion gap: 4 — ABNORMAL LOW (ref 5–15)
BUN: 21 mg/dL (ref 8–23)
CO2: 23 mmol/L (ref 22–32)
Calcium: 8.7 mg/dL — ABNORMAL LOW (ref 8.9–10.3)
Chloride: 113 mmol/L — ABNORMAL HIGH (ref 98–111)
Creatinine, Ser: 1.38 mg/dL — ABNORMAL HIGH (ref 0.44–1.00)
GFR, Estimated: 38 mL/min — ABNORMAL LOW (ref >60)
Glucose, Bld: 118 mg/dL — ABNORMAL HIGH (ref 70–99)
Potassium: 3.9 mmol/L (ref 3.5–5.1)
Sodium: 140 mmol/L (ref 135–145)

## 2023-05-15 LAB — CBC
HCT: 37 % (ref 36.0–46.0)
Hemoglobin: 11.9 g/dL — ABNORMAL LOW (ref 12.0–15.0)
MCH: 28.1 pg (ref 26.0–34.0)
MCHC: 32.2 g/dL (ref 30.0–36.0)
MCV: 87.5 fL (ref 80.0–100.0)
Platelets: 230 10*3/uL (ref 150–400)
RBC: 4.23 MIL/uL (ref 3.87–5.11)
RDW: 24.2 % — ABNORMAL HIGH (ref 11.5–15.5)
WBC: 6.8 10*3/uL (ref 4.0–10.5)
nRBC: 0 % (ref 0.0–0.2)

## 2023-05-15 LAB — GLUCOSE, CAPILLARY
Glucose-Capillary: 97 mg/dL (ref 70–99)
Glucose-Capillary: 187 mg/dL — ABNORMAL HIGH (ref 70–99)
Glucose-Capillary: 150 mg/dL — ABNORMAL HIGH (ref 70–99)

## 2023-05-15 MED ORDER — ENOXAPARIN SODIUM 40 MG/0.4ML IJ SOSY
40.0000 mg | PREFILLED_SYRINGE | INTRAMUSCULAR | Status: DC
  Administered 2023-05-15 – 2023-05-17 (×3): 40 mg via SUBCUTANEOUS
  Filled 2023-05-15 (×3): qty 0.4

## 2023-05-15 MED ORDER — FUROSEMIDE 40 MG PO TABS
40.0000 mg | ORAL_TABLET | Freq: Every day | ORAL | Status: DC
  Administered 2023-05-15 – 2023-05-19 (×5): 40 mg via ORAL
  Filled 2023-05-15 (×5): qty 1

## 2023-05-15 MED ORDER — CARVEDILOL 12.5 MG PO TABS
12.5000 mg | ORAL_TABLET | Freq: Two times a day (BID) | ORAL | Status: DC
  Administered 2023-05-15 – 2023-05-19 (×8): 12.5 mg via ORAL
  Filled 2023-05-15 (×8): qty 1

## 2023-05-15 NOTE — Progress Notes (Signed)
Physical Therapy Treatment Patient Details Name: Carly Cortez MRN: [id number] DOB: [birth year] Today's Date: 05/15/2023   History of Present Illness Patient is a 82 year old female with  presenting with fall, weakness, UTI, acute on chronic kidney disease. History significant of vulvar cancer, stage III CKD, type 2 diabetes, hypertension, hyperlipidemia, HFpEF    PT Comments  Patient is agreeable to PT session and making progress towards goals. Increased independence with bed mobility and transfers this session, requiring only one person assistance (Max A) compared to 2 person assistance yesterday. Facilitation for steadying and weight shifting with stand step transfer from bed to chair. Standing activity tolerance limited by fatigue. Recommend to continue PT to maximize independence and facilitate return to prior level of function. Anticipate patient could benefit from rehabilitation after this hospital stay <3 hours a day.     If plan is discharge home, recommend the following: Two people to help with walking and/or transfers;A lot of help with bathing/dressing/bathroom;Assist for transportation;Help with stairs or ramp for entrance;Assistance with cooking/housework   Can travel by private vehicle     No  Equipment Recommendations   (to be determined at next level of care)    Recommendations for Other Services       Precautions / Restrictions Precautions Precautions: Fall Restrictions Weight Bearing Restrictions: No     Mobility  Bed Mobility Overal bed mobility: Needs Assistance Bed Mobility: Supine to Sit     Supine to sit: Max assist     General bed mobility comments: assistance for trunk and BLE support. cues for sequencing and technique    Transfers Overall transfer level: Needs assistance   Transfers: Bed to chair/wheelchair/BSC Sit to Stand: Max assist   Step pivot transfers: Max assist       General transfer comment: increased independence with stand step  transfer. verbal cues for technique. assistance for lifting from bed and faciliation for weight shifting to take several small steps from bed to chair. fair eccentric control.    Ambulation/Gait               General Gait Details: not attempted due to poor standing tolerance, fatigue with activity   Stairs             Wheelchair Mobility     Tilt Bed    Modified Rankin (Stroke Patients Only)       Balance Overall balance assessment: Needs assistance Sitting-balance support: Feet supported, No upper extremity supported, Single extremity supported Sitting balance-Leahy Scale: Fair     Standing balance support: Bilateral upper extremity supported, Reliant on assistive device for balance Standing balance-Leahy Scale: Poor Standing balance comment: heavy reliance on rolling walker. only one person assistance required to maintain standing balance with standing tolerance of around 20 seconds                            Cognition Arousal: Alert Behavior During Therapy: WFL for tasks assessed/performed Overall Cognitive Status: No family/caregiver present to determine baseline cognitive functioning                                 General Comments: patient is able to follow single step commands with increased time        Exercises      General Comments        Pertinent Vitals/Pain Pain Assessment Pain Assessment: Faces Faces Pain  Scale: Hurts a little bit Pain Location: R knee Pain Descriptors / Indicators: Sore Pain Intervention(s): Limited activity within patient's tolerance, Monitored during session, Repositioned    Home Living                          Prior Function            PT Goals (current goals can now be found in the care plan section) Acute Rehab PT Goals Patient Stated Goal: to get stronger PT Goal Formulation: With patient Time For Goal Achievement: 05/28/23 Potential to Achieve Goals: Good Progress  towards PT goals: Progressing toward goals    Frequency    Min 1X/week      PT Plan      Co-evaluation              AM-PAC PT "6 Clicks" Mobility   Outcome Measure  Help needed turning from your back to your side while in a flat bed without using bedrails?: A Lot Help needed moving from lying on your back to sitting on the side of a flat bed without using bedrails?: A Lot Help needed moving to and from a bed to a chair (including a wheelchair)?: A Lot Help needed standing up from a chair using your arms (e.g., wheelchair or bedside chair)?: A Lot Help needed to walk in hospital room?: Total Help needed climbing 3-5 steps with a railing? : Total 6 Click Score: 10    End of Session   Activity Tolerance: Patient tolerated treatment well Patient left: in chair;with call bell/phone within reach;with chair alarm set;with nursing/sitter in room Nurse Communication: Mobility status PT Visit Diagnosis: Unsteadiness on feet (R26.81);Muscle weakness (generalized) (M62.81)     Time: 3086-5784 PT Time Calculation (min) (ACUTE ONLY): 13 min  Charges:    $Therapeutic Activity: 8-22 mins PT General Charges $$ ACUTE PT VISIT: 1 Visit                     Donna Bernard, PT, MPT    [name] 05/15/2023, 10:19 AM

## 2023-05-15 NOTE — Plan of Care (Signed)
  Problem: Coping: Goal: Ability to adjust to condition or change in health will improve Outcome: Progressing   Problem: Fluid Volume: Goal: Ability to maintain a balanced intake and output will improve Outcome: Progressing   Problem: Metabolic: Goal: Ability to maintain appropriate glucose levels will improve Outcome: Progressing   Problem: Nutritional: Goal: Maintenance of adequate nutrition will improve Outcome: Progressing   Problem: Skin Integrity: Goal: Risk for impaired skin integrity will decrease Outcome: Progressing   Problem: Tissue Perfusion: Goal: Adequacy of tissue perfusion will improve Outcome: Progressing   Problem: Education: Goal: Knowledge of General Education information will improve Description: Including pain rating scale, medication(s)/side effects and non-pharmacologic comfort measures Outcome: Progressing   Problem: Clinical Measurements: Goal: Will remain free from infection Outcome: Progressing Goal: Diagnostic test results will improve Outcome: Progressing Goal: Respiratory complications will improve Outcome: Progressing Goal: Cardiovascular complication will be avoided Outcome: Progressing   Problem: Activity: Goal: Risk for activity intolerance will decrease Outcome: Progressing   Problem: Nutrition: Goal: Adequate nutrition will be maintained Outcome: Progressing   Problem: Coping: Goal: Level of anxiety will decrease Outcome: Progressing   Problem: Elimination: Goal: Will not experience complications related to bowel motility Outcome: Progressing Goal: Will not experience complications related to urinary retention Outcome: Progressing   Problem: Pain Managment: Goal: General experience of comfort will improve Outcome: Progressing   Problem: Safety: Goal: Ability to remain free from injury will improve Outcome: Progressing   Problem: Skin Integrity: Goal: Risk for impaired skin integrity will decrease Outcome:  Progressing   Problem: Fluid Volume: Goal: Hemodynamic stability will improve Outcome: Progressing   Problem: Clinical Measurements: Goal: Diagnostic test results will improve Outcome: Progressing Goal: Signs and symptoms of infection will decrease Outcome: Progressing   Problem: Respiratory: Goal: Ability to maintain adequate ventilation will improve Outcome: Progressing

## 2023-05-15 NOTE — Progress Notes (Signed)
Patient verbalized permission for this nurse to discuss up to date status with daughter, Babette Relic.

## 2023-05-15 NOTE — Progress Notes (Signed)
Progress Note   Patient: Carly Cortez NWG:[id number] DOB: [birth year] DOA: 05/13/2023     2 DOS: the patient was seen and examined on 05/15/2023   Brief hospital course: Carly Cortez is a 82 y.o. female with medical history significant of vulvar cancer, stage III CKD, type 2 diabetes, hypertension, hyperlipidemia, HFpEF presenting with fall, weakness, UTI, acute on chronic kidney disease reports worsening weakness over the past 1 to 2 days. generalized malaise, decreased p.o. intake. dysuria and increased urinary frequency.  Had falls x 2 admitted to hospitalist service for further management of UTI, generalized weakness.  Assessment and Plan: * E.coli UTI (urinary tract infection) Has weakness, dysuria and urinalysis indicative of infection Continue IV Rocephin Urine culture grew E.coli, sensitivities pending.  Generalized weakness Falls x 2 Progressive weakness in the setting of acute UTI Noted active vulvar cancer, dehydration or confounding issues Stop gentle IV fluid hydration, encourage oral diet, supplements. PT/ OT follow up. Likely benefit from rehab. Fall/ aspiration precautions Supportive care.  Acute kidney injury superimposed on chronic kidney disease (HCC) Baseline Cr 1-1.4 w/ GFR in 30s-50s - CKD stage 3b Creatinine improved with IV hydration Avoid nephrotoxic agents including Lasix Monitor daily renal function.  Chronic diastolic CHF (congestive heart failure) (HCC) 2D echo August 2021 with EF of 45 to 50% Caution with IV hydration. Coreg, Imdur, Lasix low dose started. Monitor volume status. She is on 2L [place].  CAD S/P percutaneous coronary angioplasty Remote history of CAD status post stenting No active chest pain. Restart Coreg, Imdur.  Type 2 diabetes wit hyperglycemia- Sugars better.  Continue Semglee 20 daily. Aaccucheks and sliding scale insulin.  Essential hypertension BP better today. Will resume beta-blocker therapy. Continue Imdur.  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 Patient underwent radical vulvectomy and bilateral groin dissection on 05/02/2012. Patient returned to the OR on 06/25/2014 for right and left vulvectomy, removal of periurethral lesion Has surveillance with Ob-Gyn in the [facility] system.     Subjective: Patient is seen and examined today morning.  She is sitting in chair, feels weak. Yesterday she slid from bedside commode. She is on 2L supplemental o2.  Physical Exam: Vitals:   05/14/23 0113 05/14/23 0807 05/14/23 1631 05/15/23 0805  BP: 120/69 123/63 (!) 145/70 (!) 164/86  Pulse: 77 72 84 73  Resp: 18 16 16 18   Temp: 98 F (36.7 C) 97.7 F (36.5 C) 98.2 F (36.8 C) 98.5 F (36.9 C)  TempSrc:      SpO2: 95% 96% 96% 97%  Weight:      Height:       General - Elderly Caucasian ill looking female, sitting, no apparent distress HEENT - PERRLA, EOMI, atraumatic head, non tender sinuses. Lung - Clear, diffuse rales Heart - S1, S2 heard, no murmurs, rubs, trace pedal edema Neuro - Alert, awake and oriented, non focal exam. Skin - Warm and dry.  Data Reviewed:     Latest Ref Rng & Units 05/15/2023    4:22 AM 05/14/2023    3:05 AM 05/13/2023   12:48 PM  CBC  WBC 4.0 - 10.5 K/uL 6.8  6.3  11.0   Hemoglobin 12.0 - 15.0 g/dL 57.8  46.9  62.9   Hematocrit 36.0 - 46.0 % 37.0  35.2  40.8   Platelets 150 - 400 K/uL 230  224  272       Latest Ref Rng & Units 05/15/2023  4:22 AM 05/14/2023    3:05 AM 05/13/2023   12:48 PM  BMP  Glucose 70 - 99 mg/dL 161  096  045   BUN 8 - 23 mg/dL 21  36  40   Creatinine 0.44 - 1.00 mg/dL 4.09  8.11  9.14   Sodium 135 - 145 mmol/L 140  138  135   Potassium 3.5 - 5.1 mmol/L 3.9  3.9  4.5   Chloride 98 - 111 mmol/L 113  108  104   CO2 22 - 32 mmol/L 23  23  21    Calcium 8.9 - 10.3 mg/dL 8.7  8.5  9.5    CT Hip Right Wo Contrast  Result Date: 05/13/2023 CLINICAL DATA:  Fall and right hip pain. EXAM: CT OF  THE RIGHT HIP WITHOUT CONTRAST TECHNIQUE: Multidetector CT imaging of the right hip was performed according to the standard protocol. Multiplanar CT image reconstructions were also generated. RADIATION DOSE REDUCTION: This exam was performed according to the departmental dose-optimization program which includes automated exposure control, adjustment of the mA and/or kV according to patient size and/or use of iterative reconstruction technique. COMPARISON:  Right hip radiograph dated 05/13/2023. FINDINGS: Bones/Joint/Cartilage No definite acute fracture. Cortical irregularity of the superior right femoral neck likely chronic. A nondisplaced fracture is less likely. MRI may provide better evaluation if there is high clinical concern for acute fracture. There is no dislocation. The bones are osteopenic. There is mild arthritic changes of the right hip. No joint effusion. Ligaments Suboptimally assessed by CT. Muscles and Tendons No acute findings.  No intramuscular fluid collection or hematoma. Soft tissues Sigmoid diverticulosis. IMPRESSION: 1. No definite acute fracture or dislocation. 2. Osteopenia and mild arthritic changes of the right hip. Electronically Signed   By: Elgie Collard M.D.   On: 05/13/2023 16:39     Family Communication: Patient lives with elderly husband, no one near by. She may need rehab pending PT eval.  Disposition: Status is: Inpatient Remains inpatient appropriate because: IV antibiotic therapy, PT eval, monitor kidney function.  Planned Discharge Destination: Rehab    Time spent: 43 minutes  Author: [name], MD 05/15/2023 3:17 PM  For on call review [email or web address].

## 2023-05-15 NOTE — Progress Notes (Signed)
PHARMACIST - PHYSICIAN COMMUNICATION  CONCERNING:  Enoxaparin (Lovenox) for DVT Prophylaxis    RECOMMENDATION: Patient was prescribed enoxaprin 30mg  q24 hours for VTE prophylaxis due to CrCl < 30 mL/min. Renal function has improved  Filed Weights   05/13/23 1238  Weight: 88.5 kg (195 lb)    Body mass index is 31.47 kg/m.  Estimated Creatinine Clearance: 35.8 mL/min (A) (by C-G formula based on SCr of 1.38 mg/dL (H)).   Patient is candidate for enoxaparin 40mg  every 24 hours based on CrCl > 17ml/min and  Weight > 45kg  DESCRIPTION: Pharmacy has adjusted enoxaparin dose per [facility] policy.  Patient is now receiving enoxaparin 40 mg every 24 hours    Elliot Gurney, PharmD, BCPS Clinical Pharmacist  05/15/2023 9:22 AM

## 2023-05-16 DIAGNOSIS — W19XXXA Unspecified fall, initial encounter: Secondary | ICD-10-CM | POA: Diagnosis not present

## 2023-05-16 DIAGNOSIS — N179 Acute kidney failure, unspecified: Secondary | ICD-10-CM | POA: Diagnosis not present

## 2023-05-16 DIAGNOSIS — B9620 Unspecified Escherichia coli [E. coli] as the cause of diseases classified elsewhere: Secondary | ICD-10-CM

## 2023-05-16 DIAGNOSIS — I5032 Chronic diastolic (congestive) heart failure: Secondary | ICD-10-CM | POA: Diagnosis not present

## 2023-05-16 DIAGNOSIS — R531 Weakness: Secondary | ICD-10-CM | POA: Diagnosis not present

## 2023-05-16 LAB — CBC
HCT: 38.6 % (ref 36.0–46.0)
Hemoglobin: 12.5 g/dL (ref 12.0–15.0)
MCH: 28.2 pg (ref 26.0–34.0)
MCHC: 32.4 g/dL (ref 30.0–36.0)
MCV: 86.9 fL (ref 80.0–100.0)
Platelets: 236 10*3/uL (ref 150–400)
RBC: 4.44 MIL/uL (ref 3.87–5.11)
RDW: 23.9 % — ABNORMAL HIGH (ref 11.5–15.5)
WBC: 7.7 10*3/uL (ref 4.0–10.5)
nRBC: 0 % (ref 0.0–0.2)

## 2023-05-16 LAB — BASIC METABOLIC PANEL
Anion gap: 8 (ref 5–15)
BUN: 17 mg/dL (ref 8–23)
CO2: 23 mmol/L (ref 22–32)
Calcium: 9.2 mg/dL (ref 8.9–10.3)
Chloride: 108 mmol/L (ref 98–111)
Creatinine, Ser: 1.17 mg/dL — ABNORMAL HIGH (ref 0.44–1.00)
GFR, Estimated: 47 mL/min — ABNORMAL LOW (ref >60)
Glucose, Bld: 146 mg/dL — ABNORMAL HIGH (ref 70–99)
Potassium: 3.5 mmol/L (ref 3.5–5.1)
Sodium: 139 mmol/L (ref 135–145)

## 2023-05-16 LAB — GLUCOSE, CAPILLARY
Glucose-Capillary: 122 mg/dL — ABNORMAL HIGH (ref 70–99)
Glucose-Capillary: 302 mg/dL — ABNORMAL HIGH (ref 70–99)
Glucose-Capillary: 170 mg/dL — ABNORMAL HIGH (ref 70–99)
Glucose-Capillary: 168 mg/dL — ABNORMAL HIGH (ref 70–99)

## 2023-05-16 LAB — URINE CULTURE: Culture: >=100000 — AB

## 2023-05-16 MED ORDER — SODIUM CHLORIDE 0.9 % IV SOLN
1.0000 g | INTRAVENOUS | Status: DC
  Administered 2023-05-16: 1 g via INTRAVENOUS
  Filled 2023-05-16: qty 10

## 2023-05-16 MED ORDER — MORPHINE SULFATE (PF) 2 MG/ML IV SOLN
2.0000 mg | INTRAVENOUS | Status: DC | PRN
  Administered 2023-05-16 – 2023-05-18 (×2): 2 mg via INTRAVENOUS
  Filled 2023-05-16 (×2): qty 1

## 2023-05-16 NOTE — Plan of Care (Signed)
  Problem: Coping: Goal: Ability to adjust to condition or change in health will improve Outcome: Progressing   Problem: Fluid Volume: Goal: Ability to maintain a balanced intake and output will improve Outcome: Progressing   Problem: Metabolic: Goal: Ability to maintain appropriate glucose levels will improve Outcome: Progressing   Problem: Nutritional: Goal: Maintenance of adequate nutrition will improve Outcome: Progressing   Problem: Skin Integrity: Goal: Risk for impaired skin integrity will decrease Outcome: Progressing   Problem: Tissue Perfusion: Goal: Adequacy of tissue perfusion will improve Outcome: Progressing   Problem: Clinical Measurements: Goal: Ability to maintain clinical measurements within normal limits will improve Outcome: Progressing Goal: Will remain free from infection Outcome: Progressing Goal: Diagnostic test results will improve Outcome: Progressing Goal: Respiratory complications will improve Outcome: Progressing Goal: Cardiovascular complication will be avoided Outcome: Progressing   Problem: Activity: Goal: Risk for activity intolerance will decrease Outcome: Progressing   Problem: Nutrition: Goal: Adequate nutrition will be maintained Outcome: Progressing   Problem: Coping: Goal: Level of anxiety will decrease Outcome: Progressing   Problem: Elimination: Goal: Will not experience complications related to bowel motility Outcome: Progressing Goal: Will not experience complications related to urinary retention Outcome: Progressing   Problem: Pain Managment: Goal: General experience of comfort will improve Outcome: Progressing   Problem: Safety: Goal: Ability to remain free from injury will improve Outcome: Progressing   Problem: Skin Integrity: Goal: Risk for impaired skin integrity will decrease Outcome: Progressing   Problem: Fluid Volume: Goal: Hemodynamic stability will improve Outcome: Progressing   Problem:  Clinical Measurements: Goal: Diagnostic test results will improve Outcome: Progressing Goal: Signs and symptoms of infection will decrease Outcome: Progressing   Problem: Respiratory: Goal: Ability to maintain adequate ventilation will improve Outcome: Progressing

## 2023-05-16 NOTE — Progress Notes (Signed)
   05/16/23 1500  Spiritual Encounters  Type of Visit Initial  Care provided to: Patient  Referral source Chaplain assessment  Reason for visit Routine spiritual support  OnCall Visit No   Chaplain visited with patient and provided compassionate care and prayer. Chaplain spiritual support services remain available as the need arises.

## 2023-05-16 NOTE — Progress Notes (Signed)
Progress Note   Patient: Carly Cortez:[id number] DOB: Feb 02, 1941 DOA: 05/13/2023     3 DOS: the patient was seen and examined on 05/16/2023   Brief hospital course: Carly Cortez is a 82 y.o. female with medical history significant of vulvar cancer, stage III CKD, type 2 diabetes, hypertension, hyperlipidemia, HFpEF presenting with fall, weakness, UTI, acute on chronic kidney disease reports worsening weakness over the past 1 to 2 days. generalized malaise, decreased p.o. intake. dysuria and increased urinary frequency.  Had falls x 2 admitted to hospitalist service for further management of UTI, generalized weakness.  Assessment and Plan: * E.coli UTI (urinary tract infection) Has weakness, dysuria and urinalysis indicative of infection Will give total 5 days of IV Rocephin Urine culture grew E.coli, sensitivities.  Generalized weakness Falls x 2 Progressive weakness. PT/ OT follow up- benefits from rehab. Fall/ aspiration precautions Supportive care. TOC working on placement.  Acute kidney injury superimposed on chronic kidney disease stage 3B Baseline Cr 1-1.4 w/ GFR in 30s-50s Creatinine improved with IV hydration. Stopped fluids, encouraged oral diet. Avoid nephrotoxic agents including Lasix Monitor daily renal function.  Chronic diastolic CHF (congestive heart failure) (HCC) 2D echo August 2021 with EF of 45 to 50% Continue Coreg, Imdur, Lasix low dose. Monitor volume status. She is on off o2 now.  CAD S/P percutaneous coronary angioplasty Remote history of CAD status post stenting No active chest pain. Restart Coreg, Imdur.  Type 2 diabetes wit hyperglycemia- Sugars better.  Continue Semglee 20 daily. Accucheks and sliding scale insulin.  Essential hypertension BP better today. Will resume beta-blocker therapy. Continue Imdur.  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 Patient underwent  radical vulvectomy and bilateral groin dissection on 05/02/2012. Patient returned to the OR on 06/25/2014 for right and left vulvectomy, removal of periurethral lesion Has surveillance with Ob-Gyn in the [facility] system.     Subjective: Patient is seen and examined today morning.  She is lying in bed accompanied by her granddaughter. No complaints, states she ate better. She is off supplemental o2.  Physical Exam: Vitals:   05/15/23 0805 05/16/23 0007 05/16/23 0840 05/16/23 1517  BP: (!) 164/86 134/76 (!) 140/82 (!) 137/120  Pulse: 73 73 86 67  Resp: 18 20 14 14   Temp: 98.5 F (36.9 C) 98.1 F (36.7 C) 98.6 F (37 C) 98.3 F (36.8 C)  TempSrc:      SpO2: 97% 95% 97% 99%  Weight:      Height:       General - Elderly Caucasian ill looking female, sitting, no apparent distress HEENT - PERRLA, EOMI, atraumatic head, non tender sinuses. Lung - Clear, diffuse rales Heart - S1, S2 heard, no murmurs, rubs, trace pedal edema Neuro - Alert, awake and oriented, non focal exam. Skin - Warm and dry.  Data Reviewed:     Latest Ref Rng & Units 05/16/2023    6:07 AM 05/15/2023    4:22 AM 05/14/2023    3:05 AM  CBC  WBC 4.0 - 10.5 K/uL 7.7  6.8  6.3   Hemoglobin 12.0 - 15.0 g/dL 95.6  21.3  08.6   Hematocrit 36.0 - 46.0 % 38.6  37.0  35.2   Platelets 150 - 400 K/uL 236  230  224       Latest Ref Rng & Units 05/16/2023    6:07 AM 05/15/2023    4:22 AM 05/14/2023    3:05  AM  BMP  Glucose 70 - 99 mg/dL 578  469  629   BUN 8 - 23 mg/dL 17  21  36   Creatinine 0.44 - 1.00 mg/dL 5.28  4.13  2.44   Sodium 135 - 145 mmol/L 139  140  138   Potassium 3.5 - 5.1 mmol/L 3.5  3.9  3.9   Chloride 98 - 111 mmol/L 108  113  108   CO2 22 - 32 mmol/L 23  23  23    Calcium 8.9 - 10.3 mg/dL 9.2  8.7  8.5    No results found.   Family Communication: talked to son over phone and grand daughters at bedside. Son states that her husband who is care taker met with MVA and is unable to take care of her, agrees that  she will nee placement.  Disposition: Status is: Inpatient Remains inpatient appropriate because: IV antibiotic therapy, PT eval, monitor kidney function.  Planned Discharge Destination: Rehab placement.    Time spent: 43 minutes  Author: [name], MD 05/16/2023 9:31 PM  For on call review [email or web address].

## 2023-05-16 NOTE — TOC Progression Note (Signed)
Transition of Care [facility]) - Progression Note    Patient Details  Name: Carly Cortez MRN: [id number] Date of Birth: [birth year]  Transition of Care [facility]) CM/SW Contact  Marlowe Sax, RN Phone Number: 05/16/2023, 11:47 AM  Clinical Narrative:     Nadyne Coombes and [facility] compete, will review bed offers once obtained  Expected Discharge Plan: Skilled Nursing Facility Barriers to Discharge: SNF Pending bed offer, Insurance Authorization  Expected Discharge Plan and Services   Discharge Planning Services: CM Consult   Living arrangements for the past 2 months: Single Family Home                 DME Arranged: N/A                     Social Determinants of Health (SDOH) Interventions SDOH Screenings   Food Insecurity: No Food Insecurity (04/06/2022)   Received from [facility], [facility]  Transportation Needs: No Transportation Needs (04/06/2022)   Received from [facility], [facility]  Financial Resource Strain: Low Risk  (04/06/2022)   Received from [facility], [facility]  Physical Activity: Insufficiently Active (04/06/2022)   Received from [facility], [facility]  Social Connections: Socially Integrated (04/06/2022)   Received from [facility], [facility]  Stress: No Stress Concern Present (04/06/2022)   Received from [facility], [facility]  Tobacco Use: Low Risk  (05/13/2023)  Health Literacy: Low Risk  (04/06/2022)   Received from [facility], [facility]    Readmission Risk Interventions     No data to display

## 2023-05-16 NOTE — NC FL2 (Signed)
[place] MEDICAID FL2 LEVEL OF CARE FORM     IDENTIFICATION  Patient Name: Carly Cortez Birthdate: [birth year] Sex: female Admission Date (Current Location): 05/13/2023  [facility] and [place] Number:  Chiropodist and Address:  [facility], [address], [place], [place] [address]      Provider Number: [id number]  Attending Physician Name and Address:  [name], MD  Relative Name and Phone Number:  Molly Maduro SPouse [phone]    Current Level of Care: Hospital Recommended Level of Care: Skilled Nursing Facility Prior Approval Number:    Date Approved/Denied:   PASRR Number: [id number]  Discharge Plan: SNF    Current Diagnoses: Patient Active Problem List   Diagnosis Date Noted   Weakness 05/13/2023   Fall 05/13/2023   Acute kidney injury superimposed on chronic kidney disease (HCC) 05/13/2023   Hypokalemia 08/15/2022   Syncope and collapse 08/14/2022   Lactic acidosis 08/14/2022   Chest pain 08/14/2022   Upper respiratory tract infection 08/14/2022   Large hiatal hernia 08/14/2022   IDA (iron deficiency anemia) 08/14/2022   Lobar pneumonia (HCC)    Acute respiratory failure with hypoxia (HCC)    Chronic diastolic CHF (congestive heart failure) (HCC) 04/12/2020   Anemia 04/12/2020   Class 1 obesity due to excess calories with body mass index (BMI) of 31.0 to 31.9 in adult 04/12/2020   Diarrhea    Chronic renal failure, stage 3a (HCC)    UTI (urinary tract infection) 10/28/2019   Acute gastroenteritis 10/28/2019   Uncontrolled type 2 diabetes mellitus with hyperglycemia, with long-term current use of insulin (HCC) 10/28/2019   History of partial colectomy 10/28/2019   History of colon cancer s/p partial colectomy 10/28/2019   History of cancer of vulva 10/28/2019   Essential hypertension 10/28/2019   Hyperlipidemia 10/28/2019   Acute metabolic encephalopathy 10/28/2019   CAD S/P percutaneous coronary angioplasty  10/28/2019   Hyperglycemia due to type 2 diabetes mellitus (HCC) 10/28/2019   Syncope 12/17/2016    Orientation RESPIRATION BLADDER Height & Weight     Self, Place, Time, Situation (forgetful)  Normal Incontinent, External catheter Weight: 88.5 kg Height:  5\' 6"  (167.6 cm)  BEHAVIORAL SYMPTOMS/MOOD NEUROLOGICAL BOWEL NUTRITION STATUS      Incontinent Diet (see dc summary)  AMBULATORY STATUS COMMUNICATION OF NEEDS Skin   Extensive Assist   Normal                       Personal Care Assistance Level of Assistance  Bathing, Feeding, Dressing Bathing Assistance: Limited assistance Feeding assistance: Independent Dressing Assistance: Maximum assistance     Functional Limitations Info  Sight, Hearing, Speech Sight Info: Adequate Hearing Info: Adequate Speech Info: Adequate    SPECIAL CARE FACTORS FREQUENCY  PT (By licensed PT), OT (By licensed OT)     PT Frequency: 5 times per week OT Frequency: 5 times per week            Contractures Contractures Info: Not present    Additional Factors Info  Code Status, Allergies Code Status Info: full code Allergies Info: Contrast Media (Iodinated Contrast Media), Colchicine, Etodolac, Indomethacin, Oxaprozin           Current Medications (05/16/2023):  This is the current hospital active medication list Current Facility-Administered Medications  Medication Dose Route Frequency Provider Last Rate Last Admin   aspirin EC tablet 81 mg  81 mg Oral Carlean Jews, MD   81 mg at 05/15/23 [phone]  carvedilol (COREG) tablet 12.5 mg  12.5 mg Oral BID WC Sreeram, Narendranath, MD   12.5 mg at 05/16/23 0839   enoxaparin (LOVENOX) injection 40 mg  40 mg Subcutaneous Q24H Jaynie Bream, RPH   40 mg at 05/15/23 2105   furosemide (LASIX) tablet 40 mg  40 mg Oral Daily [name], MD   40 mg at 05/16/23 0927   insulin aspart (novoLOG) injection 0-9 Units  0-9 Units Subcutaneous TID WC Floydene Flock, MD   7 Units  at 05/16/23 1132   insulin glargine-yfgn (SEMGLEE) injection 20 Units  20 Units Subcutaneous Daily Floydene Flock, MD   20 Units at 05/16/23 0929   isosorbide mononitrate (IMDUR) 24 hr tablet 60 mg  60 mg Oral Daily Floydene Flock, MD   60 mg at 05/16/23 0927   morphine (PF) 2 MG/ML injection 2 mg  2 mg Intravenous Q4H PRN Mansy, Jan A, MD   2 mg at 05/16/23 0224   ondansetron (ZOFRAN) tablet 4 mg  4 mg Oral Q6H PRN Floydene Flock, MD       Or   ondansetron [facility]) injection 4 mg  4 mg Intravenous Q6H PRN Floydene Flock, MD       pantoprazole (PROTONIX) EC tablet 40 mg  40 mg Oral Daily Floydene Flock, MD   40 mg at 05/16/23 6644   pregabalin (LYRICA) capsule 75 mg  75 mg Oral Daily PRN Floydene Flock, MD   75 mg at 05/15/23 2105     Discharge Medications: Please see discharge summary for a list of discharge medications.  Relevant Imaging Results:  Relevant Lab Results:   Additional Information SS# [id number]  Marlowe Sax, RN

## 2023-05-17 ENCOUNTER — Inpatient Hospital Stay: Payer: 59

## 2023-05-17 DIAGNOSIS — I5032 Chronic diastolic (congestive) heart failure: Secondary | ICD-10-CM | POA: Diagnosis not present

## 2023-05-17 DIAGNOSIS — W19XXXA Unspecified fall, initial encounter: Secondary | ICD-10-CM | POA: Diagnosis not present

## 2023-05-17 DIAGNOSIS — R531 Weakness: Secondary | ICD-10-CM | POA: Diagnosis not present

## 2023-05-17 DIAGNOSIS — N179 Acute kidney failure, unspecified: Secondary | ICD-10-CM | POA: Diagnosis not present

## 2023-05-17 LAB — GLUCOSE, CAPILLARY
Glucose-Capillary: 126 mg/dL — ABNORMAL HIGH (ref 70–99)
Glucose-Capillary: 134 mg/dL — ABNORMAL HIGH (ref 70–99)
Glucose-Capillary: 205 mg/dL — ABNORMAL HIGH (ref 70–99)
Glucose-Capillary: 211 mg/dL — ABNORMAL HIGH (ref 70–99)
Glucose-Capillary: 211 mg/dL — ABNORMAL HIGH (ref 70–99)

## 2023-05-17 MED ORDER — GADOBUTROL 1 MMOL/ML IV SOLN
8.0000 mL | Freq: Once | INTRAVENOUS | Status: AC | PRN
  Administered 2023-05-17: 8 mL via INTRAVENOUS

## 2023-05-17 MED ORDER — LORAZEPAM 1 MG PO TABS
1.0000 mg | ORAL_TABLET | Freq: Four times a day (QID) | ORAL | Status: DC | PRN
  Administered 2023-05-17 – 2023-05-18 (×2): 1 mg via ORAL
  Filled 2023-05-17 (×3): qty 1

## 2023-05-17 MED ORDER — CEPHALEXIN 500 MG PO CAPS
500.0000 mg | ORAL_CAPSULE | Freq: Two times a day (BID) | ORAL | Status: AC
  Administered 2023-05-17 – 2023-05-19 (×4): 500 mg via ORAL
  Filled 2023-05-17 (×4): qty 1

## 2023-05-17 NOTE — Progress Notes (Signed)
Physical Therapy Treatment Patient Details Name: Carly Cortez MRN: [id number] DOB: 1941-03-04 Today's Date: 05/17/2023   History of Present Illness Patient is a 82 year old female with  presenting with fall, weakness, UTI, acute on chronic kidney disease. History significant of vulvar cancer, stage III CKD, type 2 diabetes, hypertension, hyperlipidemia, HFpEF    PT Comments  Patient declined mobility due to having sat up several times today. Patient reports feeling like she was leaning to the right with sitting up earlier today, nurse reports imaging has been ordered. Patient was agreeable to in bed exercises for strengthening. Patient reports mild R leg pain (mostly in the knee) with AROM. Recommend to continue PT to maximize independence and facilitate return to prior level of function. Anticipate patient could benefit from continued PT after this hospital stay < 3 hours/day.     If plan is discharge home, recommend the following: Two people to help with walking and/or transfers;A lot of help with bathing/dressing/bathroom;Assist for transportation;Help with stairs or ramp for entrance;Assistance with cooking/housework   Can travel by private vehicle     No  Equipment Recommendations       Recommendations for Other Services       Precautions / Restrictions Precautions Precautions: Fall Restrictions Weight Bearing Restrictions: No     Mobility  Bed Mobility               General bed mobility comments: patient declined mobility due to already having sat up    Transfers                        Ambulation/Gait                   Stairs             Wheelchair Mobility     Tilt Bed    Modified Rankin (Stroke Patients Only)       Balance                                            Cognition Arousal: Alert Behavior During Therapy: WFL for tasks assessed/performed Overall Cognitive Status: Within Functional Limits for  tasks assessed                                          Exercises General Exercises - Lower Extremity Ankle Circles/Pumps: AROM, Strengthening, Both, 10 reps, Supine Heel Slides: AAROM, Strengthening, Right, 10 reps, Supine Hip ABduction/ADduction: AAROM, Strengthening, Right, 10 reps, Supine Other Exercises Other Exercises: patient reports mild pain with movement of right leg with guarding noted.    General Comments        Pertinent Vitals/Pain Pain Assessment Pain Assessment: Faces Faces Pain Scale: Hurts a little bit Pain Location: R knee Pain Descriptors / Indicators: Sore Pain Intervention(s): Limited activity within patient's tolerance, Monitored during session, Repositioned    Home Living                          Prior Function            PT Goals (current goals can now be found in the care plan section) Acute Rehab PT Goals PT Goal Formulation: With patient Time For  Goal Achievement: 05/28/23 Potential to Achieve Goals: Good Progress towards PT goals: Progressing toward goals    Frequency    Min 1X/week      PT Plan      Co-evaluation              AM-PAC PT "6 Clicks" Mobility   Outcome Measure  Help needed turning from your back to your side while in a flat bed without using bedrails?: A Lot Help needed moving from lying on your back to sitting on the side of a flat bed without using bedrails?: A Lot Help needed moving to and from a bed to a chair (including a wheelchair)?: A Lot Help needed standing up from a chair using your arms (e.g., wheelchair or bedside chair)?: A Lot Help needed to walk in hospital room?: Total Help needed climbing 3-5 steps with a railing? : Total 6 Click Score: 10    End of Session   Activity Tolerance: Patient limited by fatigue Patient left: in bed;with call bell/phone within reach;with bed alarm set Nurse Communication: Mobility status PT Visit Diagnosis: Unsteadiness on feet  (R26.81);Muscle weakness (generalized) (M62.81)     Time: 8657-8469 PT Time Calculation (min) (ACUTE ONLY): 8 min  Charges:    $Therapeutic Exercise: 8-22 mins PT General Charges $$ ACUTE PT VISIT: 1 Visit                     Donna Bernard, PT, MPT    [name] 05/17/2023, 12:14 PM

## 2023-05-17 NOTE — Progress Notes (Signed)
Progress Note   Patient: Carly Cortez NGE:[id number] DOB: 11-Apr-1941 DOA: 05/13/2023     4 DOS: the patient was seen and examined on 05/17/2023   Brief hospital course: Carly Cortez is a 82 y.o. female with medical history significant of vulvar cancer, stage III CKD, type 2 diabetes, hypertension, hyperlipidemia, HFpEF presenting with fall, weakness, UTI, acute on chronic kidney disease reports worsening weakness over the past 1 to 2 days. generalized malaise, decreased p.o. intake. dysuria and increased urinary frequency.  Had falls x 2 admitted to hospitalist service for further management of UTI, generalized weakness.  Assessment and Plan: * E.coli UTI (urinary tract infection) Has weakness, dysuria and urinalysis indicative of infection Transition IV Rocephin to Keflex for 2 days. Urine culture grew E.coli, sensitivities reviewed.  Generalized weakness Falls x 2 PT/ OT follow up- benefits from rehab. OT saw her more concerned about right sided weakness. MRI brain ordered rule out stroke. Fall/ aspiration precautions, supportive care. TOC working on SNF placement.  Acute kidney injury superimposed on chronic kidney disease stage 3B Baseline Cr 1-1.4 w/ GFR in 30s-50s Creatinine improved with IV hydration. Stopped fluids, encouraged oral diet. Avoid nephrotoxic agents including Lasix Monitor daily renal function.  Chronic diastolic CHF (congestive heart failure) (HCC) 2D echo August 2021 with EF of 45 to 50% Continue Coreg, Imdur, Lasix low dose. Monitor volume status. She is on off o2 now.  CAD S/P percutaneous coronary angioplasty Remote history of CAD status post stenting No active chest pain. Restart Coreg, Imdur.  Type 2 diabetes wit hyperglycemia- Sugars better.  Continue Semglee 20 daily. Accucheks and sliding scale insulin.  Essential hypertension BP better today. Will resume beta-blocker therapy. Continue Imdur.  History of cancer of vulva Patient underwent  radical vulvectomy and bilateral groin dissection on 05/02/2012. Has surveillance with Ob-Gyn in the [facility] system.     Subjective: Patient is seen and examined today morning.  She is lying in bed, no family at bedside. Feels better, denies any complaints. She is off supplemental o2. No overnight issues.  Physical Exam: Vitals:   05/16/23 0840 05/16/23 1517 05/16/23 2352 05/17/23 0847  BP: (!) 140/82 (!) 137/120 128/71 123/74  Pulse: 86 67 77 76  Resp: 14 14 16 17   Temp: 98.6 F (37 C) 98.3 F (36.8 C) 97.9 F (36.6 C) 98.3 F (36.8 C)  TempSrc:   Oral   SpO2: 97% 99% 91% 94%  Weight:      Height:       General - Elderly Caucasian ill looking female, sitting, no apparent distress HEENT - PERRLA, EOMI, atraumatic head, non tender sinuses. Lung - Clear, diffuse rales Heart - S1, S2 heard, no murmurs, rubs, trace pedal edema Neuro - Alert, awake and oriented, right > left sided weakness. Skin - Warm and dry.  Data Reviewed:     Latest Ref Rng & Units 05/16/2023    6:07 AM 05/15/2023    4:22 AM 05/14/2023    3:05 AM  CBC  WBC 4.0 - 10.5 K/uL 7.7  6.8  6.3   Hemoglobin 12.0 - 15.0 g/dL 40.1  02.7  25.3   Hematocrit 36.0 - 46.0 % 38.6  37.0  35.2   Platelets 150 - 400 K/uL 236  230  224       Latest Ref Rng & Units 05/16/2023    6:07 AM 05/15/2023    4:22 AM 05/14/2023    3:05 AM  BMP  Glucose 70 - 99 mg/dL 664  118  151   BUN 8 - 23 mg/dL 17  21  36   Creatinine 0.44 - 1.00 mg/dL 1.02  7.25  3.66   Sodium 135 - 145 mmol/L 139  140  138   Potassium 3.5 - 5.1 mmol/L 3.5  3.9  3.9   Chloride 98 - 111 mmol/L 108  113  108   CO2 22 - 32 mmol/L 23  23  23    Calcium 8.9 - 10.3 mg/dL 9.2  8.7  8.5    No results found.   Family Communication: talked to son over phone and grand daughters at bedside. Son states that her husband who is care taker met with MVA and is unable to take care of her, agrees that she will nee placement.  Disposition: Status is: Inpatient Remains inpatient  appropriate because: SNF placement. MRI brain rule out stroke.  Planned Discharge Destination: Rehab placement.    Time spent: 40 minutes  Author: [name], MD 05/17/2023 3:03 PM  For on call review [email or web address].

## 2023-05-17 NOTE — Plan of Care (Signed)
  Problem: Coping: Goal: Ability to adjust to condition or change in health will improve Outcome: Progressing   

## 2023-05-17 NOTE — TOC Progression Note (Addendum)
Transition of Care [facility]) - Progression Note    Patient Details  Name: Carly Cortez MRN: [id number] Date of Birth: 10/16/1940  Transition of Care [facility]) CM/SW Contact  Marlowe Sax, RN Phone Number: 05/17/2023, 9:52 AM  Clinical Narrative:     Attempted to reach the patient's daughter Babette Relic, left a general Voice mail for a call back to review bed offers   Update Tammy called back, we reviewed the bed offers, she chose [facility], I notified  Tiffany at [employer] pending Expected Discharge Plan: Skilled Nursing Facility Barriers to Discharge: SNF Pending bed offer, English as a second language teacher  Expected Discharge Plan and Services   Discharge Planning Services: CM Consult   Living arrangements for the past 2 months: Single Family Home                 DME Arranged: N/A                     Social Determinants of Health (SDOH) Interventions SDOH Screenings   Food Insecurity: No Food Insecurity (04/06/2022)   Received from [facility], [facility]  Transportation Needs: No Transportation Needs (04/06/2022)   Received from [facility], [facility]  Financial Resource Strain: Low Risk  (04/06/2022)   Received from [facility], [facility]  Physical Activity: Insufficiently Active (04/06/2022)   Received from [facility], [facility]  Social Connections: Socially Integrated (04/06/2022)   Received from [facility], [facility]  Stress: No Stress Concern Present (04/06/2022)   Received from [facility], [facility]  Tobacco Use: Low Risk  (05/13/2023)  Health Literacy: Low Risk  (04/06/2022)   Received from [facility], [facility]    Readmission Risk Interventions     No data to display

## 2023-05-17 NOTE — Progress Notes (Signed)
Occupational Therapy Treatment Patient Details Name: Carly Cortez MRN: [id number] DOB: [birth year] Today's Date: 05/17/2023   History of present illness Patient is a 82 year old female with  presenting with fall, weakness, UTI, acute on chronic kidney disease. History significant of vulvar cancer, stage III CKD, type 2 diabetes, hypertension, hyperlipidemia, HFpEF   OT comments  Upon entering the room, pt supine in bed and agreeable to OT intervention. Pt's daughter present in room and reports concerns about weakness in R side and pt now using L UE in dominant fashion. Pt also reporting not feeling well today and just "off". OT removes purewick and notices BM. Pt rolling L <> R with mod A and total A for hygiene. Supine >sit with max A for trunk support and B LEs. Pt continues to endorse not feeling well with increased R weakness and mild R lean. OT assisted pt back to bed and notified RN and MD with MD reporting plans to repeat imaging. Call bell and all needed items within reach and RN present as therapist exits the room.       If plan is discharge home, recommend the following:  Two people to help with bathing/dressing/bathroom;Assistance with cooking/housework;Assist for transportation;Help with stairs or ramp for entrance;Assistance with feeding;Supervision due to cognitive status;Two people to help with walking and/or transfers   Equipment Recommendations  Other (comment) (defer to next venue of care)       Precautions / Restrictions Precautions Precautions: Fall Restrictions Weight Bearing Restrictions: No       Mobility Bed Mobility Overal bed mobility: Needs Assistance Bed Mobility: Rolling, Supine to Sit, Sit to Supine Rolling: Mod assist   Supine to sit: Max assist Sit to supine: Max assist        Transfers                   General transfer comment: not attempted secondary to pt reporting increased weakness and "not feeling right"     Balance Overall  balance assessment: Needs assistance Sitting-balance support: Feet supported, No upper extremity supported, Single extremity supported Sitting balance-Leahy Scale: Fair                                     ADL either performed or assessed with clinical judgement   ADL Overall ADL's : Needs assistance/impaired                                       General ADL Comments: Pt rolls L <> R with mod A for therapist to provide total A with hygiene secondary to BM in bed.    Extremity/Trunk Assessment Upper Extremity Assessment Upper Extremity Assessment: Generalized weakness;RUE deficits/detail RUE Deficits / Details: R shoulder soreness/stiffness with decreased AROM and strength per pt and daughter   Lower Extremity Assessment Lower Extremity Assessment: Generalized weakness        Vision Patient Visual Report: No change from baseline            Cognition Arousal: Alert Behavior During Therapy: WFL for tasks assessed/performed Overall Cognitive Status: Within Functional Limits for tasks assessed  Pertinent Vitals/ Pain       Pain Assessment Pain Assessment: Faces Faces Pain Scale: Hurts a little bit Pain Location: R knee Pain Descriptors / Indicators: Sore Pain Intervention(s): Limited activity within patient's tolerance, Monitored during session, Repositioned         Frequency  Min 1X/week        Progress Toward Goals  OT Goals(current goals can now be found in the care plan section)  Progress towards OT goals: Progressing toward goals      AM-PAC OT "6 Clicks" Daily Activity     Outcome Measure   Help from another person eating meals?: A Little Help from another person taking care of personal grooming?: A Little Help from another person toileting, which includes using toliet, bedpan, or urinal?: A Lot Help from another person bathing (including washing,  rinsing, drying)?: A Lot Help from another person to put on and taking off regular upper body clothing?: A Lot Help from another person to put on and taking off regular lower body clothing?: A Lot 6 Click Score: 14    End of Session    OT Visit Diagnosis: Other abnormalities of gait and mobility (R26.89);Muscle weakness (generalized) (M62.81)   Activity Tolerance Patient limited by fatigue   Patient Left in bed;with call bell/phone within reach;with bed alarm set   Nurse Communication Mobility status        Time: 1050-1110 OT Time Calculation (min): 20 min  Charges: OT General Charges $OT Visit: 1 Visit OT Treatments $Self Care/Home Management : 8-22 mins  Jackquline Denmark, MS, OTR/L , CBIS ascom [phone]  05/17/23, 2:02 PM

## 2023-05-17 NOTE — Care Management Important Message (Signed)
Important Message  Patient Details  Name: NINEL HOSKIE MRN: [id number] Date of Birth: 08-22-1941   Medicare Important Message Given:  Yes     Olegario Messier A Derwood Becraft 05/17/2023, 11:40 AM

## 2023-05-18 ENCOUNTER — Inpatient Hospital Stay: Payer: 59

## 2023-05-18 DIAGNOSIS — W19XXXA Unspecified fall, initial encounter: Secondary | ICD-10-CM | POA: Diagnosis not present

## 2023-05-18 DIAGNOSIS — I639 Cerebral infarction, unspecified: Secondary | ICD-10-CM | POA: Diagnosis not present

## 2023-05-18 DIAGNOSIS — R9089 Other abnormal findings on diagnostic imaging of central nervous system: Secondary | ICD-10-CM

## 2023-05-18 DIAGNOSIS — N390 Urinary tract infection, site not specified: Secondary | ICD-10-CM | POA: Diagnosis not present

## 2023-05-18 DIAGNOSIS — R296 Repeated falls: Secondary | ICD-10-CM | POA: Diagnosis not present

## 2023-05-18 DIAGNOSIS — I63522 Cerebral infarction due to unspecified occlusion or stenosis of left anterior cerebral artery: Secondary | ICD-10-CM

## 2023-05-18 DIAGNOSIS — R531 Weakness: Secondary | ICD-10-CM | POA: Diagnosis not present

## 2023-05-18 DIAGNOSIS — N179 Acute kidney failure, unspecified: Secondary | ICD-10-CM | POA: Diagnosis not present

## 2023-05-18 DIAGNOSIS — R29898 Other symptoms and signs involving the musculoskeletal system: Secondary | ICD-10-CM

## 2023-05-18 LAB — GLUCOSE, CAPILLARY
Glucose-Capillary: 156 mg/dL — ABNORMAL HIGH (ref 70–99)
Glucose-Capillary: 220 mg/dL — ABNORMAL HIGH (ref 70–99)
Glucose-Capillary: 183 mg/dL — ABNORMAL HIGH (ref 70–99)
Glucose-Capillary: 195 mg/dL — ABNORMAL HIGH (ref 70–99)
Glucose-Capillary: 140 mg/dL — ABNORMAL HIGH (ref 70–99)

## 2023-05-18 LAB — LIPID PANEL
Cholesterol: 121 mg/dL (ref 0–200)
HDL: 38 mg/dL — ABNORMAL LOW (ref >40)
LDL Cholesterol: 30 mg/dL (ref 0–99)
Total CHOL/HDL Ratio: 3.2 ratio
Triglycerides: 265 mg/dL — ABNORMAL HIGH (ref <150)
VLDL: 53 mg/dL — ABNORMAL HIGH (ref 0–40)

## 2023-05-18 MED ORDER — OXYCODONE HCL 5 MG PO TABS
5.0000 mg | ORAL_TABLET | Freq: Four times a day (QID) | ORAL | Status: DC | PRN
  Administered 2023-05-18: 5 mg via ORAL
  Filled 2023-05-18: qty 1

## 2023-05-18 MED ORDER — FAMOTIDINE 20 MG PO TABS
20.0000 mg | ORAL_TABLET | Freq: Every day | ORAL | Status: DC
  Administered 2023-05-18 – 2023-05-19 (×2): 20 mg via ORAL
  Filled 2023-05-18 (×2): qty 1

## 2023-05-18 MED ORDER — CYCLOBENZAPRINE HCL 10 MG PO TABS
5.0000 mg | ORAL_TABLET | Freq: Three times a day (TID) | ORAL | Status: DC | PRN
  Administered 2023-05-18: 5 mg via ORAL
  Filled 2023-05-18: qty 1

## 2023-05-18 MED ORDER — GADOBUTROL 1 MMOL/ML IV SOLN
9.0000 mL | Freq: Once | INTRAVENOUS | Status: AC | PRN
  Administered 2023-05-18: 9 mL via INTRAVENOUS

## 2023-05-18 MED ORDER — PREGABALIN 50 MG PO CAPS: 50.0000 mg | ORAL_CAPSULE | Freq: Two times a day (BID) | ORAL | Status: DC

## 2023-05-18 MED ORDER — CLOPIDOGREL BISULFATE 75 MG PO TABS
75.0000 mg | ORAL_TABLET | Freq: Every day | ORAL | Status: DC
  Administered 2023-05-18 – 2023-05-19 (×2): 75 mg via ORAL
  Filled 2023-05-18 (×2): qty 1

## 2023-05-18 MED ORDER — ENOXAPARIN SODIUM 60 MG/0.6ML IJ SOSY
0.5000 mg/kg | PREFILLED_SYRINGE | INTRAMUSCULAR | Status: DC
  Administered 2023-05-18: 45 mg via SUBCUTANEOUS
  Filled 2023-05-18: qty 0.6

## 2023-05-18 MED ORDER — PREGABALIN 75 MG PO CAPS
75.0000 mg | ORAL_CAPSULE | Freq: Two times a day (BID) | ORAL | Status: DC
  Administered 2023-05-18 – 2023-05-19 (×2): 75 mg via ORAL
  Filled 2023-05-18 (×2): qty 1
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
## 2023-05-18 NOTE — Progress Notes (Signed)
   05/18/23 1500  Spiritual Encounters  Type of Visit Follow up  Care provided to: Pt and family  Reason for visit Urgent spiritual support  OnCall Visit Yes  Spiritual Framework  Presenting Themes Meaning/purpose/sources of inspiration;Rituals and practive;Community and relationships  Patient Stress Factors Loss;Major life changes  Family Stress Factors Loss;Major life changes  Interventions  Spiritual Care Interventions Made Established relationship of care and support;Compassionate presence;Reflective listening;Normalization of emotions;Prayer  Intervention Outcomes  Outcomes Connection to spiritual care;Awareness of support  Spiritual Care Plan  Spiritual Care Issues Still Outstanding No further spiritual care needs at this time (see row info)   Since PT been in the hospital they have found out their husband has pass. Patient seem to be doing well after hearing the terrible new. Patient is sad but have family surrounding her to comfort her during this time.  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 I let the family know we are there for them as well during this time.

## 2023-05-18 NOTE — Consult Note (Signed)
PHARMACIST - PHYSICIAN COMMUNICATION  CONCERNING:  Enoxaparin (Lovenox) for DVT Prophylaxis    RECOMMENDATION: Patient was prescribed enoxaprin 40mg  q24 hours for VTE prophylaxis.   Filed Weights   05/13/23 1238  Weight: 88.5 kg (195 lb)    Body mass index is 31.47 kg/m.  Estimated Creatinine Clearance: 42.3 mL/min (A) (by C-G formula based on SCr of 1.17 mg/dL (H)).   Based on [facility] policy patient is candidate for enoxaparin 0.5mg /kg TBW SQ every 24 hours based on BMI being >30.  DESCRIPTION: Pharmacy has adjusted enoxaparin dose per [facility] policy.  Patient is now receiving enoxaparin 45 mg every 24 hours   Celene Squibb, PharmD Clinical Pharmacist 05/18/2023 3:22 PM

## 2023-05-18 NOTE — Plan of Care (Signed)
  Problem: Education: Goal: Ability to describe self-care measures that may prevent or decrease complications (Diabetes Survival Skills Education) will improve Outcome: Progressing Goal: Individualized Educational Video(s) Outcome: Progressing   Problem: Coping: Goal: Ability to adjust to condition or change in health will improve Outcome: Progressing   Problem: Fluid Volume: Goal: Ability to maintain a balanced intake and output will improve Outcome: Progressing   Problem: Health Behavior/Discharge Planning: Goal: Ability to identify and utilize available resources and services will improve Outcome: Progressing Goal: Ability to manage health-related needs will improve Outcome: Progressing   Problem: Metabolic: Goal: Ability to maintain appropriate glucose levels will improve Outcome: Progressing   Problem: Nutritional: Goal: Maintenance of adequate nutrition will improve Outcome: Progressing Goal: Progress toward achieving an optimal weight will improve Outcome: Progressing   Problem: Skin Integrity: Goal: Risk for impaired skin integrity will decrease Outcome: Progressing   Problem: Tissue Perfusion: Goal: Adequacy of tissue perfusion will improve Outcome: Progressing   Problem: Education: Goal: Knowledge of General Education information will improve Description: Including pain rating scale, medication(s)/side effects and non-pharmacologic comfort measures Outcome: Progressing   Problem: Health Behavior/Discharge Planning: Goal: Ability to manage health-related needs will improve Outcome: Progressing   Problem: Clinical Measurements: Goal: Ability to maintain clinical measurements within normal limits will improve Outcome: Progressing Goal: Will remain free from infection Outcome: Progressing Goal: Diagnostic test results will improve Outcome: Progressing Goal: Respiratory complications will improve Outcome: Progressing Goal: Cardiovascular complication will  be avoided Outcome: Progressing   Problem: Activity: Goal: Risk for activity intolerance will decrease Outcome: Progressing   Problem: Nutrition: Goal: Adequate nutrition will be maintained Outcome: Progressing   Problem: Coping: Goal: Level of anxiety will decrease Outcome: Progressing   Problem: Elimination: Goal: Will not experience complications related to bowel motility Outcome: Progressing Goal: Will not experience complications related to urinary retention Outcome: Progressing   Problem: Pain Managment: Goal: General experience of comfort will improve Outcome: Progressing   Problem: Safety: Goal: Ability to remain free from injury will improve Outcome: Progressing   Problem: Skin Integrity: Goal: Risk for impaired skin integrity will decrease Outcome: Progressing   Problem: Fluid Volume: Goal: Hemodynamic stability will improve Outcome: Progressing   Problem: Clinical Measurements: Goal: Diagnostic test results will improve Outcome: Progressing Goal: Signs and symptoms of infection will decrease Outcome: Progressing   Problem: Respiratory: Goal: Ability to maintain adequate ventilation will improve Outcome: Progressing   

## 2023-05-18 NOTE — Progress Notes (Signed)
   05/18/23 1100  Spiritual Encounters  Type of Visit Initial  Care provided to: Patient  Referral source Nurse (RN/NT/LPN)  Reason for visit Urgent spiritual support  OnCall Visit Yes  Spiritual Framework  Patient Stress Factors Major life changes  Interventions  Spiritual Care Interventions Made Established relationship of care and support;Compassionate presence;Reflective listening;Normalization of emotions;Prayer  Intervention Outcomes  Outcomes Connection to spiritual care;Awareness of support  Spiritual Care Plan  Spiritual Care Issues Still Outstanding Chaplain will continue to follow   Receive a phone call from the nurses station that patient is having difficult dealing with medical issues.She just was told that she had a stroke. Plus patient husband has been in a bad accident and is currently in hospital. We talk a little while and I pray with the patient. Patient seem mentally and physically tire during our talk. I advise her to rest and that I will be coming back to check on her later.

## 2023-05-18 NOTE — TOC Progression Note (Signed)
Transition of Care [facility]) - Progression Note    Patient Details  Name: Carly Cortez MRN: [id number] Date of Birth: 1941/06/11  Transition of Care [facility]) CM/SW Contact  Garret Reddish, RN Phone Number: 05/18/2023, 2:38 PM  Clinical Narrative:   SNF authorization approved.  Plan Auth ID- [id number], Auth [id number], Next review date is 05/20/23.  I have informed Dr. Clide Dales that patient has been approved for SNF.  Dr. Clide Dales reports that patient is Stroke diagnosis and awaiting Neurology recommendations.    Patient has accepted bed offer at [facility].  TOC will continue to follow for discharge planning.       Expected Discharge Plan: Skilled Nursing Facility Barriers to Discharge: SNF Pending bed offer, Insurance Authorization  Expected Discharge Plan and Services   Discharge Planning Services: CM Consult   Living arrangements for the past 2 months: Single Family Home                 DME Arranged: N/A                     Social Determinants of Health (SDOH) Interventions SDOH Screenings   Food Insecurity: No Food Insecurity (04/06/2022)   Received from [facility], [facility]  Transportation Needs: No Transportation Needs (04/06/2022)   Received from [facility], [facility]  Financial Resource Strain: Low Risk  (04/06/2022)   Received from [facility], [facility]  Physical Activity: Insufficiently Active (04/06/2022)   Received from [facility], [facility]  Social Connections: Socially Integrated (04/06/2022)   Received from [facility], [facility]  Stress: No Stress Concern Present (04/06/2022)   Received from [facility], [facility]  Tobacco Use: Low Risk  (05/13/2023)  Health Literacy: Low Risk  (04/06/2022)   Received from [facility], [facility]    Readmission Risk Interventions     No data to display

## 2023-05-18 NOTE — Progress Notes (Signed)
Patient complained of severe left leg pain. I examined her, pain even with touch, no redness or swelling noted. 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 Uses Lyrica 75 bid which she did not get while here since last 2 days. Did not improve with Lyrica, flexeril. States morphine makes her confused. Discussed with RN. I am unable to reach daughter at this time. Prior records reviewed, I will give her Oxy 5mg  q6 PRN severe pain. Stop morphine. Continue to monitor.

## 2023-05-18 NOTE — Progress Notes (Signed)
Progress Note   Patient: Carly Cortez LKG:[id number] DOB: 1940/09/14 DOA: 05/13/2023     5 DOS: the patient was seen and examined on 05/18/2023   Brief hospital course: Carly Cortez is a 82 y.o. female with medical history significant of vulvar cancer, stage III CKD, type 2 diabetes, hypertension, hyperlipidemia, HFpEF presenting with fall, weakness, UTI, acute on chronic kidney disease reports worsening weakness over the past 1 to 2 days. generalized malaise, decreased p.o. intake. dysuria and increased urinary frequency.  Had falls x 2 admitted to hospitalist service for further management of UTI, generalized weakness.  Assessment and Plan: *Acute ischemic stroke  Involving left anterior cerebella artery seen on MRI brain. Neurology evaluation called. HbA1c 6.7 Lipid panel ordered. Continue telemetry. Continue neurochecks per protocol. PT/ OT follow up. She is able to swallow, speech not affected. Echo with bubble study. Continue aspirin, statin Further management per neurology eval.  E.coli UTI (urinary tract infection) Continue Keflex for 2 days. Urine culture grew E.coli, sensitivities reviewed.  Generalized weakness Falls x 2 Due to acute stroke. PT/ OT follow up- benefits from rehab. Fall/ aspiration precautions, supportive care. TOC working on SNF placement. She has rehab bed available, will discharge after stroke work up.  Acute kidney injury superimposed on chronic kidney disease stage 3B Baseline Cr 1-1.4 w/ GFR in 30s-50s Creatinine improved with IV hydration. Avoid nephrotoxic agents. Monitor daily renal function.  Chronic diastolic CHF (congestive heart failure) (HCC) 2D echo with bubble study ordered. Continue Coreg, Imdur, Lasix low dose daily. Monitor volume status. She is on off o2 now.  CAD S/P percutaneous coronary angioplasty Remote history of CAD status post stenting No active chest pain. Restart Coreg, Imdur.  Type 2 diabetes wit  hyperglycemia- Sugars better.  Continue Semglee 20 daily. Accucheks and sliding scale insulin.  Essential hypertension BP better today. Will resume beta-blocker therapy. Continue Imdur.  History of cancer of vulva Patient underwent radical vulvectomy and bilateral groin dissection on 05/02/2012. Has surveillance with Ob-Gyn in the [facility] system.  She is in grief due to loss of her husband. Will give supportive care, Ativan if needed. Had prolonged discussion with her daughter this morning. Offered my condolences.      Subjective: Patient is seen and examined today morning.  She is lying in bed comfortably. I discussed with her regarding her stroke finding on MRI and further work up. Later I discussed with her daughter who informed me that patient's husband had terrible accident on June 07, 2023 and he died. Family at bedside comforting her. Ativan ordered.  Physical Exam: Vitals:   05/17/23 1529 05/17/23 2016 05/18/23 0747 05/18/23 1440  BP: 137/65 (!) 140/86 117/70 108/84  Pulse: 86 83 73 76  Resp: 16 18 18 16   Temp: 97.6 F (36.4 C) 98.2 F (36.8 C) 98 F (36.7 C) 98.6 F (37 C)  TempSrc:  Oral  Oral  SpO2: 98% 96% 92% 94%  Weight:      Height:       General - Elderly Caucasian ill looking female, lying in bed, in distress, grief. HEENT - PERRLA, EOMI, atraumatic head, non tender sinuses. Lung - Clear, diffuse rales Heart - S1, S2 heard, no murmurs, rubs, trace pedal edema Neuro - Alert, awake and oriented, right > left sided weakness. Skin - Warm and dry.  Data Reviewed:     Latest Ref Rng & Units 05/16/2023    6:07 AM 05/15/2023    4:22 AM 05/14/2023    3:05 AM  CBC  WBC 4.0 - 10.5 K/uL 7.7  6.8  6.3   Hemoglobin 12.0 - 15.0 g/dL 16.1  09.6  04.5   Hematocrit 36.0 - 46.0 % 38.6  37.0  35.2   Platelets 150 - 400 K/uL 236  230  224       Latest Ref Rng & Units 05/16/2023    6:07 AM 05/15/2023    4:22 AM 05/14/2023    3:05 AM  BMP  Glucose 70 - 99 mg/dL 409  811  914   BUN 8  - 23 mg/dL 17  21  36   Creatinine 0.44 - 1.00 mg/dL 7.82  9.56  2.13   Sodium 135 - 145 mmol/L 139  140  138   Potassium 3.5 - 5.1 mmol/L 3.5  3.9  3.9   Chloride 98 - 111 mmol/L 108  113  108   CO2 22 - 32 mmol/L 23  23  23    Calcium 8.9 - 10.3 mg/dL 9.2  8.7  8.5    MR BRAIN W WO CONTRAST  Result Date: 05/18/2023 CLINICAL DATA:  Right-sided weakness EXAM: MRI HEAD WITHOUT AND WITH CONTRAST TECHNIQUE: Multiplanar, multiecho pulse sequences of the brain and surrounding structures were obtained without and with intravenous contrast. CONTRAST:  8mL GADAVIST GADOBUTROL 1 MMOL/ML IV SOLN COMPARISON:  Head CT 05/13/2023 FINDINGS: Brain: Intermediate sized area of acute ischemia within the left anterior cerebral artery territory. No acute hemorrhage. Numerous foci of magnetic susceptibility effect along the tentorium cerebelli, possibly a sequela of remote hemorrhage. Old right cerebellar small vessel infarct. Mild generalized volume loss. Midline structures are normal. There is a punctate focus of contrast enhancement at the right caudate head (series 16, image 92). Vascular: Normal flow voids. Skull and upper cervical spine: Normal bone marrow signal. Sinuses/Orbits: Paranasal sinuses and mastoids are clear. Bilateral ocular lens replacements. Other: None IMPRESSION: 1. Intermediate sized area of acute ischemia within the left anterior cerebral artery territory. No hemorrhage or mass effect. 2. Punctate focus of contrast enhancement at the right caudate head, indeterminate. 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 3. Numerous foci of magnetic susceptibility effect along the tentorium cerebelli, possibly a sequela of remote hemorrhage. Electronically Signed   By: Deatra Robinson M.D.   On: 05/18/2023 00:39     Family Communication: talked to son over phone and grand daughters at bedside. Son states that her husband who  is care taker met with MVA and is unable to take care of her, agrees that she will nee placement.  Disposition: Status is: Inpatient Remains inpatient appropriate because: Stroke work up  Planned Discharge Destination: Rehab placement after stroke work up.    Time spent: 44 minutes  Author: [name], MD 05/18/2023 3:47 PM  For on call review [email or web address].

## 2023-05-19 ENCOUNTER — Inpatient Hospital Stay (HOSPITAL_COMMUNITY)
Admit: 2023-05-19 | Discharge: 2023-05-19 | Disposition: A | Discharge status: Home / Self Care | Payer: 59 | Attending: Internal Medicine | Admitting: Internal Medicine

## 2023-05-19 DIAGNOSIS — I5032 Chronic diastolic (congestive) heart failure: Secondary | ICD-10-CM | POA: Diagnosis not present

## 2023-05-19 DIAGNOSIS — R531 Weakness: Secondary | ICD-10-CM | POA: Diagnosis not present

## 2023-05-19 DIAGNOSIS — I639 Cerebral infarction, unspecified: Secondary | ICD-10-CM | POA: Diagnosis not present

## 2023-05-19 DIAGNOSIS — I6389 Other cerebral infarction: Secondary | ICD-10-CM | POA: Diagnosis not present

## 2023-05-19 DIAGNOSIS — N179 Acute kidney failure, unspecified: Secondary | ICD-10-CM | POA: Diagnosis not present

## 2023-05-19 LAB — ECHOCARDIOGRAM COMPLETE
AR max vel: 2.3 cm2
AV Area VTI: 3.14 cm2
AV Area mean vel: 2.45 cm2
AV Mean grad: 3 mmHg
AV Peak grad: 4.8 mmHg
Ao pk vel: 1.09 m/s
Area-P 1/2: 3.56 cm2
Height: 66 in
MV VTI: 2.01 cm2
S' Lateral: 2.5 cm
Weight: 3120 [oz_av]

## 2023-05-19 LAB — GLUCOSE, CAPILLARY
Glucose-Capillary: 116 mg/dL — ABNORMAL HIGH (ref 70–99)
Glucose-Capillary: 165 mg/dL — ABNORMAL HIGH (ref 70–99)

## 2023-05-19 MED ORDER — PREGABALIN 75 MG PO CAPS: 75.0000 mg | ORAL_CAPSULE | Freq: Two times a day (BID) | ORAL | Status: DC

## 2023-05-19 MED ORDER — CLOPIDOGREL BISULFATE 75 MG PO TABS: 75.0000 mg | ORAL_TABLET | Freq: Every day | ORAL | 2 refills | Status: DC

## 2023-05-19 MED ORDER — LANTUS SOLOSTAR 100 UNIT/ML ~~LOC~~ SOPN: 20.0000 [IU] | PEN_INJECTOR | Freq: Every day | SUBCUTANEOUS | Status: DC

## 2023-05-19 MED ORDER — POLYETHYLENE GLYCOL 3350 17 G PO PACK
17.0000 g | PACK | Freq: Every day | ORAL | Status: DC
  Filled 2023-05-19: qty 1

## 2023-05-19 MED ORDER — SENNA 8.6 MG PO TABS: 2.0000 | ORAL_TABLET | Freq: Every day | ORAL | Status: DC

## 2023-05-19 MED ORDER — DOCUSATE SODIUM 100 MG PO CAPS
100.0000 mg | ORAL_CAPSULE | Freq: Two times a day (BID) | ORAL | Status: DC
  Filled 2023-05-19: qty 1

## 2023-05-19 MED ORDER — FUROSEMIDE 40 MG PO TABS: 40.0000 mg | ORAL_TABLET | Freq: Every day | ORAL | Status: DC

## 2023-05-19 NOTE — Plan of Care (Signed)
Neurology plan of care  Please see neurology consult note from yesterday. Briefly, Ms. Freeburn is an 82 year old woman with past medical history significant for vulvar cancer in 2013 under surveillance by oncology, stage III CKD, type 2 diabetes, hypertension, hyperlipidemia, heart failure with preserved ejection fraction who presented with generalized weakness, frequent falls, UTI, and acute on chronic kidney failure.  PT noted that she had R sided weakness leg>arm and subsequent MRI brain 05/17/23 showed acute ischemic stroke in the L ACA territory.   Stroke workup this admission:  MRI brain wo contrast 1. Intermediate sized area of acute ischemia within the left anterior cerebral artery territory. No hemorrhage or mass effect. 2. Punctate focus of contrast enhancement at the right caudate head, indeterminate. 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 3. Numerous foci of magnetic susceptibility effect along the tentorium cerebelli, possibly a sequela of remote hemorrhage.    MRA head  1. Left A3 occlusion correlating with the acute infarct. 2. Generalized intracranial atherosclerosis without proximal and correctable stenosis.  MRA neck  1. Moderate narrowing of the origin of the right ICA and right vertebral artery. 2. No evidence of occlusion.  CNS imaging personally reviewed; I agree with above interpretations  Stroke Labs     Component Value Date/Time   CHOL 121 05/18/2023 0901   TRIG 265 (H) 05/18/2023 0901   HDL 38 (L) 05/18/2023 0901   CHOLHDL 3.2 05/18/2023 0901   VLDL 53 (H) 05/18/2023 0901   LDLCALC 30 05/18/2023 0901    Lab Results  Component Value Date/Time   HGBA1C 6.7 (H) 05/13/2023 12:39 PM   HGBA1C 7.7 (H) 08/13/2014 11:24 AM   Etiology of stroke was large vessel occlusion (L A3).  Updated recommendations: - No indication for permissive HTN > 48 hrs from sx onet,  please avoid hypotension - ASA 81mg  daily + plavix 75mg  daily x21 days f/b plavix 75mg  daily monotherapy after that (DAPT for 90 days instead of 21 2/2 severe intracranial stenosis/occlusion) - No indication for statin, LDL is at goal on repatha - PT/OT/SLP - Stroke education - Ambulatory cardiac monitoring upon hospital discharge given LVO which may have central embolic source - F/u TTE. If no intracardiac clot or other significant finding patient may be discharged today to SNF as planned - Patient will need repeat MRI brain wwo in 3 mos to assess stability of punctate focus of contrast enhancement in the R caudate head. I will arrange outpatient neurology f/u.  Neurology will f/u on results of TTE, otherwise will be available prn for questions going forward.  Bing Neighbors, MD [facility] [phone]  If 7pm- 7am, please page neurology on call as listed in AMION.

## 2023-05-19 NOTE — Consult Note (Signed)
NEUROLOGY CONSULTATION NOTE   Date of service: May 19, 2023 Patient Name: Carly Cortez MRN:  [id number] DOB:  1941-03-30 Reason for consult: acute ischemic stroke Requesting physician: Dr. [name] _ _ _   _ __   _ __ _ _  __ __   _ __   __ _  History of Present Illness   Carly Cortez is an 82 year old woman with past medical history significant for vulvar cancer in 2013 under surveillance by oncology, stage III CKD, type 2 diabetes, hypertension, hyperlipidemia, heart failure with preserved ejection fraction who presented with generalized weakness, frequent falls, UTI, and acute on chronic kidney failure.  She reported generalized malaise, decreased p.o. intake, dysuria, and increased urinary frequency.  She was admitted for treatment of her UTI and for PT OT for her generalized weakness and falls.  After admission PT noticed that she was weaker on the right side than the left, particularly in her leg.  Yesterday she underwent MRI and was found to have an intermediate size of acute ischemia in the left ACA territory.  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 MRI brain was personally reviewed.  Stroke workup this admission:  MRI brain 1. Intermediate sized area of acute ischemia within the left anterior cerebral artery territory. No hemorrhage or mass effect. 2. Punctate focus of contrast enhancement at the right caudate head, indeterminate. 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 3. Numerous foci of magnetic susceptibility effect along the tentorium cerebelli, possibly a sequela of remote hemorrhage.  Cerebrovascular imaging - not yet performed  TTE - ordered; results pending  Stroke Labs     Component Value  Date/Time   CHOL 121 05/18/2023 0901   TRIG 265 (H) 05/18/2023 0901   HDL 38 (L) 05/18/2023 0901   CHOLHDL 3.2 05/18/2023 0901   VLDL 53 (H) 05/18/2023 0901   LDLCALC 30 05/18/2023 0901    Lab Results  Component Value Date/Time   HGBA1C 6.7 (H) 05/13/2023 12:39 PM   HGBA1C 7.7 (H) 08/13/2014 11:24 AM     ROS   Per HPI: all other systems reviewed and are negative  Past History   I have reviewed the following:  Past Medical History:  Diagnosis Date   Cancer (HCC)    vaginal   Chronic kidney disease    Diabetes mellitus without complication (HCC)    Gout    High cholesterol    Hypertension    Past Surgical History:  Procedure Laterality Date   ABDOMINAL HYSTERECTOMY     APPENDECTOMY     cardiac stents     CHOLECYSTECTOMY     HERNIA REPAIR     PARTIAL COLECTOMY     Family History  Problem Relation Age of Onset   Heart attack Mother    Diabetes Mother    Other Father        unknown medical history   Social History   Socioeconomic History   Marital status: Married    Spouse name: Not on file   Number of children: Not on file   Years of education: Not on file   Highest education level: Not on file  Occupational History   Not on file  Tobacco Use   Smoking status: Never   Smokeless tobacco: Never  Vaping Use   Vaping status: Never Used  Substance and Sexual Activity   Alcohol use: No   Drug use: No   Sexual activity: Not on file  Other Topics Concern   Not on file  Social History Narrative   Not on file   Social Determinants of Health   Financial Resource Strain: Low Risk  (04/06/2022)   Received from [facility], [facility]   Overall Financial Resource Strain (CARDIA)    Difficulty of Paying Living Expenses: Not very hard  Food Insecurity: No Food Insecurity (04/06/2022)   Received from [facility], [facility]   Hunger Vital Sign    Worried About Running Out of Food in the Last Year: Never true    Ran Out of Food in the Last  Year: Never true  Transportation Needs: No Transportation Needs (04/06/2022)   Received from [facility], [facility]   [facility] - Transportation    Lack of Transportation (Medical): No    Lack of Transportation (Non-Medical): No  Physical Activity: Insufficiently Active (04/06/2022)   Received from [facility], [facility]   Exercise Vital Sign    Days of Exercise per Week: 3 days    Minutes of Exercise per Session: 30 min  Stress: No Stress Concern Present (04/06/2022)   Received from [facility], [facility] of Occupational Health - Occupational Stress Questionnaire    Feeling of Stress : Not at all  Social Connections: Socially Integrated (04/06/2022)   Received from [facility], [facility]   Social Connection and Isolation Panel [NHANES]    Frequency of Communication with Friends and Family: More than three times a week    Frequency of Social Gatherings with Friends and Family: More than three times a week    Attends Religious Services: More than 4 times per year    Active Member of [employer] or Organizations: Yes    Attends Engineer, structural: More than 4 times per year    Marital Status: Married   Allergies  Allergen Reactions   Contrast Media [Iodinated Contrast Media] Swelling   Colchicine Other (See Comments)    Gallbladder Problems   Etodolac Swelling   Indomethacin Swelling   Oxaprozin Swelling    Medications   Medications Prior to Admission  Medication Sig Dispense Refill Last Dose   carvedilol (COREG) 12.5 MG tablet Take 12.5 mg by mouth 2 (two) times daily with a meal.   unk   furosemide (LASIX) 40 MG tablet Take 40 mg by mouth 2 (two) times daily. TAKE 1 TABLET BY MOUTH ONCE DAILY CAN TAKE AN ADDITIONAL 1 TABLET IN THE AFTERNOON IF WEIGHT IS UP 3 POUNDS OR MORE   unk   LANTUS SOLOSTAR 100 UNIT/ML Solostar Pen Inject 40-50 Units into the skin at bedtime.   Past Week at pm   lidocaine (XYLOCAINE) 5 % ointment  Apply 1 Application topically daily as needed.   prn at unk   meloxicam (MOBIC) 15 MG tablet Take 15 mg by mouth daily.   unk   ondansetron (ZOFRAN) 8 MG tablet Take 8 mg by mouth every 8 (eight) hours as needed.   prn at unk   polyethylene glycol (MIRALAX / GLYCOLAX) 17 g packet Take 17 g by mouth daily.   unk   pregabalin (LYRICA) 75 MG capsule Take 75 mg by mouth every evening.   Past Week at pm   REPATHA  SURECLICK 140 MG/ML SOAJ Inject 140 mg into the skin every 14 (fourteen) days.    Past Month   senna (SENOKOT) 8.6 MG tablet Take 1 tablet by mouth daily.   unk   spironolactone (ALDACTONE) 25 MG tablet Take 1 tablet by mouth daily.   unk   amLODipine (NORVASC) 2.5 MG tablet Take 2.5 mg by mouth daily. (Patient not taking: Reported on 05/13/2023)   Not Taking   aspirin 81 MG tablet Take 81 mg by mouth every other day.    unk   estradiol (ESTRACE) 0.1 MG/GM vaginal cream Place 2 g vaginally daily. (Patient not taking: Reported on 05/13/2023)   Not Taking   famotidine (PEPCID) 20 MG tablet Take 1 tablet by mouth daily. (Patient not taking: Reported on 05/13/2023)   Not Taking   fexofenadine (ALLEGRA) 180 MG tablet Take 180 mg by mouth daily.   unk   isosorbide mononitrate (IMDUR) 120 MG 24 hr tablet Take 120 mg by mouth daily. (Patient not taking: Reported on 08/15/2022)   Not Taking   isosorbide mononitrate (IMDUR) 60 MG 24 hr tablet Take 60 mg by mouth daily. (Patient not taking: Reported on 05/13/2023)   Not Taking   mometasone (ELOCON) 0.1 % cream Apply 1 application topically 2 (two) times daily. (Patient not taking: Reported on 05/13/2023)   Not Taking   nitroGLYCERIN (NITROSTAT) 0.4 MG SL tablet Place 0.4 mg under the tongue every 5 (five) minutes as needed for chest pain.   prn at unk   oxyCODONE (OXY IR/ROXICODONE) 5 MG immediate release tablet Take 5 mg by mouth every 6 (six) hours as needed for moderate pain or severe pain. (Patient not taking: Reported on 05/13/2023)   Not Taking at unk    pantoprazole (PROTONIX) 40 MG tablet Take 40 mg by mouth daily. (Patient not taking: Reported on 05/13/2023)   Not Taking   pregabalin (LYRICA) 50 MG capsule Take 50 mg by mouth 2 (two) times daily.       Vitamin D, Ergocalciferol, (DRISDOL) 1.25 MG (50000 UNIT) CAPS capsule Take 50,000 Units by mouth once a week. (Patient not taking: Reported on 05/13/2023)   Not Taking      Current Facility-Administered Medications:    aspirin EC tablet 81 mg, 81 mg, Oral, QODAY, Floydene Flock, MD, 81 mg at 05/17/23 0853   carvedilol (COREG) tablet 12.5 mg, 12.5 mg, Oral, BID WC, Sreeram, Narendranath, MD, 12.5 mg at 05/18/23 1557   cephALEXin (KEFLEX) capsule 500 mg, 500 mg, Oral, Q12H, Sreeram, Narendranath, MD, 500 mg at 05/18/23 2334   clopidogrel (PLAVIX) tablet 75 mg, 75 mg, Oral, Daily, Jefferson Fuel, MD, 75 mg at 05/18/23 1832   cyclobenzaprine (FLEXERIL) tablet 5 mg, 5 mg, Oral, TID PRN, [name], MD, 5 mg at 05/18/23 1633   enoxaparin (LOVENOX) injection 45 mg, 0.5 mg/kg, Subcutaneous, Q24H, Coulter, Carolyn, RPH, 45 mg at 05/18/23 2333   famotidine (PEPCID) tablet 20 mg, 20 mg, Oral, Daily, Sreeram, Narendranath, MD, 20 mg at 05/18/23 1832   furosemide (LASIX) tablet 40 mg, 40 mg, Oral, Daily, Sreeram, Narendranath, MD, 40 mg at 05/18/23 1019   insulin aspart (novoLOG) injection 0-9 Units, 0-9 Units, Subcutaneous, TID WC, Floydene Flock, MD, 2 Units at 05/18/23 1810   insulin glargine-yfgn (SEMGLEE) injection 20 Units, 20 Units, Subcutaneous, Daily, Floydene Flock, MD, 20 Units at 05/18/23 1028   isosorbide mononitrate (IMDUR) 24 hr tablet 60 mg, 60 mg, Oral, Daily, Floydene Flock, MD, 60 mg  at 05/18/23 1020   LORazepam (ATIVAN) tablet 1 mg, 1 mg, Oral, Q6H PRN, Clide Dales, Narendranath, MD, 1 mg at 05/18/23 1340   ondansetron (ZOFRAN) tablet 4 mg, 4 mg, Oral, Q6H PRN **OR** ondansetron (ZOFRAN) injection 4 mg, 4 mg, Intravenous, Q6H PRN, Floydene Flock, MD   oxyCODONE (Oxy  IR/ROXICODONE) immediate release tablet 5 mg, 5 mg, Oral, Q6H PRN, [name], MD, 5 mg at 05/18/23 1832   pantoprazole (PROTONIX) EC tablet 40 mg, 40 mg, Oral, Daily, Floydene Flock, MD, 40 mg at 05/18/23 1019   pregabalin (LYRICA) capsule 75 mg, 75 mg, Oral, BID, Sreeram, Narendranath, MD, 75 mg at 05/18/23 2334  Vitals   Vitals:   05/18/23 1554 05/18/23 1958 05/18/23 2337 05/19/23 0458  BP: 120/65 97/66 129/71 (!) 143/65  Pulse: 84 82 84 77  Resp:  17 20 20   Temp:  99 F (37.2 C) 98.2 F (36.8 C) 98.2 F (36.8 C)  TempSrc:  Oral    SpO2: 100% 99% 92% 94%  Weight:      Height:         Body mass index is 31.47 kg/m.  Physical Exam   Physical Exam Gen: A&O x4, NAD HEENT: Atraumatic, normocephalic;mucous membranes moist; oropharynx clear, tongue without atrophy or fasciculations. Neck: Supple, trachea midline. Resp: CTAB, no w/r/r CV: RRR, no m/g/r; nml S1 and S2. 2+ symmetric peripheral pulses. Abd: soft/NT/ND; nabs x 4 quad Extrem: Nml bulk; no cyanosis, clubbing, or edema.  Neuro: *MS: A&O x4. Follows multi-step commands.  *Speech: fluid, mild dysarthria, able to name and repeat *CN:    I: Deferred   II,III: PERRLA, VFF by confrontation, optic discs unable to be visualized 2/2 pupillary constriction   III,IV,VI: EOMI w/o nystagmus, no ptosis   V: Sensation intact from V1 to V3 to LT   VII: Eyelid closure was full.  R lower facial droop   VIII: Hearing intact to voice   IX,X: Voice normal, palate elevates symmetrically    XI: SCM/trap 5/5 bilat   XII: Tongue protrudes midline, no atrophy or fasciculations. LUE and LLE no drift, RUE drift but not to bed, no movement against gravity RLE. *Sensory: SILT. No double-simultaneous extinction.  *Coordination:  FNF without ataxia, RAM are slowed *Reflexes:  1+ and symmetric throughout without clonus; toes down-going bilat *Gait: deferred  NIHSS  1a Level of Conscious.: 2 1b LOC Questions: 0 1c LOC  Commands:0 2 Best Gaze: 0 3 Visual: 0 4 Facial Palsy: 1 5a Motor Arm - left: 0 5b Motor Arm - Right: 1 6a Motor Leg - Left: 0 6b Motor Leg - Right: 3 7 Limb Ataxia: 0 8 Sensory: 0 9 Best Language: 0 10 Dysarthria: 1 11 Extinct. and Inatten.: 1  TOTAL: 9   Premorbid mRS = 3   Labs   CBC:  Recent Labs  Lab 05/13/23 1248 05/14/23 0305 05/15/23 0422 05/16/23 0607  WBC 11.0*   < > 6.8 7.7  NEUTROABS 8.8*  --   --   --   HGB 13.2   < > 11.9* 12.5  HCT 40.8   < > 37.0 38.6  MCV 86.6   < > 87.5 86.9  PLT 272   < > 230 236   < > = values in this interval not displayed.    Basic Metabolic Panel:  Lab Results  Component Value Date   NA 139 05/16/2023   K 3.5 05/16/2023   CO2 23 05/16/2023   GLUCOSE 146 (H) 05/16/2023  BUN 17 05/16/2023   CREATININE 1.17 (H) 05/16/2023   CALCIUM 9.2 05/16/2023   GFRNONAA 47 (L) 05/16/2023   GFRAA 42 (L) 04/15/2020   Lipid Panel:  Lab Results  Component Value Date   LDLCALC 30 05/18/2023   HgbA1c:  Lab Results  Component Value Date   HGBA1C 6.7 (H) 05/13/2023   Urine Drug Screen: No results found for: "LABOPIA", "COCAINSCRNUR", "LABBENZ", "AMPHETMU", "THCU", "LABBARB"  Alcohol Level No results found for: "ETH"  MRI brain wo contrast 1. Intermediate sized area of acute ischemia within the left anterior cerebral artery territory. No hemorrhage or mass effect. 2. Punctate focus of contrast enhancement at the right caudate head, indeterminate. 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 3. Numerous foci of magnetic susceptibility effect along the tentorium cerebelli, possibly a sequela of remote hemorrhage.  Personally reviewed  Impression   Ms. Rebello is an 82 year old woman with past medical history significant for vulvar cancer in 2013 under surveillance by oncology, stage III CKD, type 2 diabetes, hypertension, hyperlipidemia, heart  failure with preserved ejection fraction who presented with generalized weakness, frequent falls, UTI, and acute on chronic kidney failure.  PT noted that she had R sided weakness leg>arm and subsequent MRI brain showed acute ischemic stroke in the L ACA territory. Etiology undetermined; workup ongoing.  Recommendations   - Permissive HTN x48 hrs from sx onset goal BP <220/110. PRN labetalol or hydralazine if BP above these parameters. Avoid oral antihypertensives. - MRA H&N - F/u TTE - Check A1c and LDL + add statin per guidelines - ASA 81mg  daily + plavix 75mg  daily x21 days f/b plavix 75mg  daily monotherapy after that - q4 hr neuro checks - STAT head CT for any change in neuro exam - Tele - PT/OT/SLP - Stroke education - Amb referral to neurology upon discharge  - Patient will need repeat MRI brain wwo in 3 mos to assess stability of punctate focus of contrast enhancement in the R caudate head - Will continue to follow ______________________________________________________________________   Thank you for the opportunity to take part in the care of this patient. If you have any further questions, please contact the neurology consultation attending.  Signed,  Bing Neighbors, MD [facility] [phone]  If 7pm- 7am, please page neurology on call as listed in AMION.  **Any copied and pasted documentation in this note was written by me in another application not billed for and pasted by me into this document.

## 2023-05-19 NOTE — Discharge Summary (Addendum)
TAKE AN ADDITIONAL 1 TABLET IN THE AFTERNOON IF WEIGHT IS UP 3 POUNDS OR MORE What changed: when to take this   Lantus SoloStar 100 UNIT/ML Solostar Pen Generic drug: insulin glargine Inject 20 Units into the skin at bedtime. What changed: how much to take   lidocaine 5 % ointment Commonly known as: XYLOCAINE Apply 1 Application topically daily as needed.   nitroGLYCERIN 0.4 MG SL tablet Commonly known as: NITROSTAT Place 0.4 mg under the tongue every 5 (five) minutes as needed for chest pain.    ondansetron 8 MG tablet Commonly known as: ZOFRAN Take 8 mg by mouth every 8 (eight) hours as needed.   pantoprazole 40 MG tablet Commonly known as: PROTONIX Take 40 mg by mouth daily.   polyethylene glycol 17 g packet Commonly known as: MIRALAX / GLYCOLAX Take 17 g by mouth daily.   pregabalin 75 MG capsule Commonly known as: LYRICA Take 1 capsule (75 mg total) by mouth 2 (two) times daily. What changed:  when to take this Another medication with the same name was removed. Continue taking this medication, and follow the directions you see here.   Repatha SureClick 140 MG/ML Soaj Generic drug: Evolocumab Inject 140 mg into the skin every 14 (fourteen) days.   senna 8.6 MG tablet Commonly known as: SENOKOT Take 1 tablet by mouth daily.        Contact information for after-discharge care     Destination     [facility] Preferred SNF .   Service: Skilled Nursing Contact information: [address] [place] [address] [phone]                    Discharge Exam: [name] Weights   05/13/23 1238  Weight: 88.5 kg   General - Elderly Caucasian ill looking female, lying in bed, in distress, grief. HEENT - PERRLA, EOMI, atraumatic head, non tender sinuses. Lung - Clear, diffuse rales Heart - S1, S2 heard, no murmurs, rubs, trace pedal edema. Abdomen- soft, non tender, bowel sounds good. Neuro - Alert, awake and oriented, right sided weakness 3/5 Skin - Warm and dry.  Condition at discharge: stable  The results of significant diagnostics from this hospitalization (including imaging, microbiology, ancillary and laboratory) are listed below for reference.   Imaging Studies: ECHOCARDIOGRAM COMPLETE  Result Date: 05/19/2023    ECHOCARDIOGRAM REPORT   Patient Name:   Carly Cortez Date of Exam: 05/19/2023 Medical Rec #:  [id number]     Height:       66.0 in Accession #:     [id number]    Weight:       195.0 lb Date of Birth:  Aug 15, 1941     BSA:          1.978 m Patient Age:    82 years      BP:           109/69 mmHg Patient Gender: F             HR:           76 bpm. Exam Location:  [facility] Procedure: 2D Echo, Cardiac Doppler and Saline Contrast Bubble Study Indications:     Stroke I63.9  History:         Patient has prior history of Echocardiogram examinations, most                  recent 04/13/2020. Risk Factors:Hypertension  Physician Discharge Summary   Patient: Carly Cortez MRN: [id number] DOB: Jul 20, 1941  Admit date:     05/13/2023  Discharge date: 05/19/23  Discharge Physician: [name]   PCP: [name], NP   Recommendations at discharge:    Blood sugar checks twice daily. ASA 81mg  daily + plavix 75mg  daily, DAPT for 90 days then monotherapy. LDL is at goal on repatha PT/OT follow up Ambulatory cardiac monitoring with primary [facility] cardiology suggested. Repeat MRI brain w/wo in 3 mos to assess stability of punctate focus of contrast enhancement in the R caudate head. Outpatient neurology f/u.  Discharge Diagnoses: Principal Problem:   UTI (urinary tract infection) Active Problems:   Weakness   Acute kidney injury superimposed on chronic kidney disease (HCC)   Chronic diastolic CHF (congestive heart failure) (HCC)   History of cancer of vulva   Essential hypertension   CAD S/P percutaneous coronary angioplasty   Anemia   Fall   Acute ischemic stroke [facility])  Resolved Problems:   * No resolved hospital problems. *  Hospital Course: Carly Cortez is a 82 y.o. female with medical history significant of vulvar cancer, stage III CKD, type 2 diabetes, hypertension, hyperlipidemia, HFpEF presenting with fall, weakness, UTI, acute on chronic kidney disease reports worsening weakness over the past 1 to 2 days. generalized malaise, decreased p.o. intake. dysuria and increased urinary frequency.  Had falls x 2 admitted to hospitalist service for further management of UTI, generalized weakness.  During hospital stay patient was started on IV Rocephin therapy, gentle IV hydration.  Patient was noted to have right-sided weakness and an MRI brain done which showed acute ischemic stroke involving left anterior cerebellar artery.  Neurologist evaluated the patient recommended stroke workup.  Echocardiogram with bubble study pending.  MRA head showed left P3 occlusion correlating with acute infarct,  MRA neck moderate narrowing of the origin of right ICA and right vertebral artery.  Patient is started on aspirin, Plavix which neurology recommended 90-day therapy given severe intracranial stenosis and occlusion.  She will need ambulatory cardiac monitoring, advised [facility] cardiology follow-up.  Neurologist also recommended 76-month MRI brain with and without contrast to assess stability of punctate focus of contrast-enhancement in right caudate head.  Outpatient neurology follow-up will be arranged.  PT OT evaluated the patient recommended skilled nursing facility placement.  I discussed with patient and her daughter at bedside who understand agree with the discharge plan.  Assessment and Plan: *Acute ischemic stroke  MRI brain showed intermediate sized area of acute ischemia within the left anterior cerebral artery territory.  Punctate focus of contrast-enhancement at the right caudate head, numerous foci of magnetic susceptibility along tentorium cerebelli possible sequela of remote hemorrhage.  MRA head- Left A3 occlusion correlating with the acute infarct. Generalized intracranial atherosclerosis without proximal and correctable stenosis.   MRA neck- Moderate narrowing of the origin of the right ICA and right vertebral artery. No evidence of occlusion.  HbA1c 6.7. LDL wnl. Echo with bubble study pending. Continue aspirin, plavix for 90 days and Aspirin thereafter. Continue Repatha. Ambulatory cardiac monitoring per [facility] cardiology follow-up. Neurology follow-up -recommended 37-month MRI brain with and without contrast to assess stability of punctate focus of contrast-enhancement in right caudate head.  Patient is hemodynamically stable to be discharged to skilled nursing facility.  E.coli UTI (urinary tract infection) Finished antibiotic therapy with Rocephin, later Keflex Urine culture grew E.coli, pansensitive   Generalized weakness Falls x 2 Due to acute stroke. PT/ OT follow up-  Physician Discharge Summary   Patient: Carly Cortez MRN: [id number] DOB: Jul 20, 1941  Admit date:     05/13/2023  Discharge date: 05/19/23  Discharge Physician: [name]   PCP: [name], NP   Recommendations at discharge:    Blood sugar checks twice daily. ASA 81mg  daily + plavix 75mg  daily, DAPT for 90 days then monotherapy. LDL is at goal on repatha PT/OT follow up Ambulatory cardiac monitoring with primary [facility] cardiology suggested. Repeat MRI brain w/wo in 3 mos to assess stability of punctate focus of contrast enhancement in the R caudate head. Outpatient neurology f/u.  Discharge Diagnoses: Principal Problem:   UTI (urinary tract infection) Active Problems:   Weakness   Acute kidney injury superimposed on chronic kidney disease (HCC)   Chronic diastolic CHF (congestive heart failure) (HCC)   History of cancer of vulva   Essential hypertension   CAD S/P percutaneous coronary angioplasty   Anemia   Fall   Acute ischemic stroke [facility])  Resolved Problems:   * No resolved hospital problems. *  Hospital Course: Carly Cortez is a 82 y.o. female with medical history significant of vulvar cancer, stage III CKD, type 2 diabetes, hypertension, hyperlipidemia, HFpEF presenting with fall, weakness, UTI, acute on chronic kidney disease reports worsening weakness over the past 1 to 2 days. generalized malaise, decreased p.o. intake. dysuria and increased urinary frequency.  Had falls x 2 admitted to hospitalist service for further management of UTI, generalized weakness.  During hospital stay patient was started on IV Rocephin therapy, gentle IV hydration.  Patient was noted to have right-sided weakness and an MRI brain done which showed acute ischemic stroke involving left anterior cerebellar artery.  Neurologist evaluated the patient recommended stroke workup.  Echocardiogram with bubble study pending.  MRA head showed left P3 occlusion correlating with acute infarct,  MRA neck moderate narrowing of the origin of right ICA and right vertebral artery.  Patient is started on aspirin, Plavix which neurology recommended 90-day therapy given severe intracranial stenosis and occlusion.  She will need ambulatory cardiac monitoring, advised [facility] cardiology follow-up.  Neurologist also recommended 76-month MRI brain with and without contrast to assess stability of punctate focus of contrast-enhancement in right caudate head.  Outpatient neurology follow-up will be arranged.  PT OT evaluated the patient recommended skilled nursing facility placement.  I discussed with patient and her daughter at bedside who understand agree with the discharge plan.  Assessment and Plan: *Acute ischemic stroke  MRI brain showed intermediate sized area of acute ischemia within the left anterior cerebral artery territory.  Punctate focus of contrast-enhancement at the right caudate head, numerous foci of magnetic susceptibility along tentorium cerebelli possible sequela of remote hemorrhage.  MRA head- Left A3 occlusion correlating with the acute infarct. Generalized intracranial atherosclerosis without proximal and correctable stenosis.   MRA neck- Moderate narrowing of the origin of the right ICA and right vertebral artery. No evidence of occlusion.  HbA1c 6.7. LDL wnl. Echo with bubble study pending. Continue aspirin, plavix for 90 days and Aspirin thereafter. Continue Repatha. Ambulatory cardiac monitoring per [facility] cardiology follow-up. Neurology follow-up -recommended 37-month MRI brain with and without contrast to assess stability of punctate focus of contrast-enhancement in right caudate head.  Patient is hemodynamically stable to be discharged to skilled nursing facility.  E.coli UTI (urinary tract infection) Finished antibiotic therapy with Rocephin, later Keflex Urine culture grew E.coli, pansensitive   Generalized weakness Falls x 2 Due to acute stroke. PT/ OT follow up-  TAKE AN ADDITIONAL 1 TABLET IN THE AFTERNOON IF WEIGHT IS UP 3 POUNDS OR MORE What changed: when to take this   Lantus SoloStar 100 UNIT/ML Solostar Pen Generic drug: insulin glargine Inject 20 Units into the skin at bedtime. What changed: how much to take   lidocaine 5 % ointment Commonly known as: XYLOCAINE Apply 1 Application topically daily as needed.   nitroGLYCERIN 0.4 MG SL tablet Commonly known as: NITROSTAT Place 0.4 mg under the tongue every 5 (five) minutes as needed for chest pain.    ondansetron 8 MG tablet Commonly known as: ZOFRAN Take 8 mg by mouth every 8 (eight) hours as needed.   pantoprazole 40 MG tablet Commonly known as: PROTONIX Take 40 mg by mouth daily.   polyethylene glycol 17 g packet Commonly known as: MIRALAX / GLYCOLAX Take 17 g by mouth daily.   pregabalin 75 MG capsule Commonly known as: LYRICA Take 1 capsule (75 mg total) by mouth 2 (two) times daily. What changed:  when to take this Another medication with the same name was removed. Continue taking this medication, and follow the directions you see here.   Repatha SureClick 140 MG/ML Soaj Generic drug: Evolocumab Inject 140 mg into the skin every 14 (fourteen) days.   senna 8.6 MG tablet Commonly known as: SENOKOT Take 1 tablet by mouth daily.        Contact information for after-discharge care     Destination     [facility] Preferred SNF .   Service: Skilled Nursing Contact information: [address] [place] [address] [phone]                    Discharge Exam: [name] Weights   05/13/23 1238  Weight: 88.5 kg   General - Elderly Caucasian ill looking female, lying in bed, in distress, grief. HEENT - PERRLA, EOMI, atraumatic head, non tender sinuses. Lung - Clear, diffuse rales Heart - S1, S2 heard, no murmurs, rubs, trace pedal edema. Abdomen- soft, non tender, bowel sounds good. Neuro - Alert, awake and oriented, right sided weakness 3/5 Skin - Warm and dry.  Condition at discharge: stable  The results of significant diagnostics from this hospitalization (including imaging, microbiology, ancillary and laboratory) are listed below for reference.   Imaging Studies: ECHOCARDIOGRAM COMPLETE  Result Date: 05/19/2023    ECHOCARDIOGRAM REPORT   Patient Name:   Carly Cortez Date of Exam: 05/19/2023 Medical Rec #:  [id number]     Height:       66.0 in Accession #:     [id number]    Weight:       195.0 lb Date of Birth:  Aug 15, 1941     BSA:          1.978 m Patient Age:    82 years      BP:           109/69 mmHg Patient Gender: F             HR:           76 bpm. Exam Location:  [facility] Procedure: 2D Echo, Cardiac Doppler and Saline Contrast Bubble Study Indications:     Stroke I63.9  History:         Patient has prior history of Echocardiogram examinations, most                  recent 04/13/2020. Risk Factors:Hypertension  TAKE AN ADDITIONAL 1 TABLET IN THE AFTERNOON IF WEIGHT IS UP 3 POUNDS OR MORE What changed: when to take this   Lantus SoloStar 100 UNIT/ML Solostar Pen Generic drug: insulin glargine Inject 20 Units into the skin at bedtime. What changed: how much to take   lidocaine 5 % ointment Commonly known as: XYLOCAINE Apply 1 Application topically daily as needed.   nitroGLYCERIN 0.4 MG SL tablet Commonly known as: NITROSTAT Place 0.4 mg under the tongue every 5 (five) minutes as needed for chest pain.    ondansetron 8 MG tablet Commonly known as: ZOFRAN Take 8 mg by mouth every 8 (eight) hours as needed.   pantoprazole 40 MG tablet Commonly known as: PROTONIX Take 40 mg by mouth daily.   polyethylene glycol 17 g packet Commonly known as: MIRALAX / GLYCOLAX Take 17 g by mouth daily.   pregabalin 75 MG capsule Commonly known as: LYRICA Take 1 capsule (75 mg total) by mouth 2 (two) times daily. What changed:  when to take this Another medication with the same name was removed. Continue taking this medication, and follow the directions you see here.   Repatha SureClick 140 MG/ML Soaj Generic drug: Evolocumab Inject 140 mg into the skin every 14 (fourteen) days.   senna 8.6 MG tablet Commonly known as: SENOKOT Take 1 tablet by mouth daily.        Contact information for after-discharge care     Destination     [facility] Preferred SNF .   Service: Skilled Nursing Contact information: [address] [place] [address] [phone]                    Discharge Exam: [name] Weights   05/13/23 1238  Weight: 88.5 kg   General - Elderly Caucasian ill looking female, lying in bed, in distress, grief. HEENT - PERRLA, EOMI, atraumatic head, non tender sinuses. Lung - Clear, diffuse rales Heart - S1, S2 heard, no murmurs, rubs, trace pedal edema. Abdomen- soft, non tender, bowel sounds good. Neuro - Alert, awake and oriented, right sided weakness 3/5 Skin - Warm and dry.  Condition at discharge: stable  The results of significant diagnostics from this hospitalization (including imaging, microbiology, ancillary and laboratory) are listed below for reference.   Imaging Studies: ECHOCARDIOGRAM COMPLETE  Result Date: 05/19/2023    ECHOCARDIOGRAM REPORT   Patient Name:   Carly Cortez Date of Exam: 05/19/2023 Medical Rec #:  [id number]     Height:       66.0 in Accession #:     [id number]    Weight:       195.0 lb Date of Birth:  Aug 15, 1941     BSA:          1.978 m Patient Age:    82 years      BP:           109/69 mmHg Patient Gender: F             HR:           76 bpm. Exam Location:  [facility] Procedure: 2D Echo, Cardiac Doppler and Saline Contrast Bubble Study Indications:     Stroke I63.9  History:         Patient has prior history of Echocardiogram examinations, most                  recent 04/13/2020. Risk Factors:Hypertension  Physician Discharge Summary   Patient: Carly Cortez MRN: [id number] DOB: Jul 20, 1941  Admit date:     05/13/2023  Discharge date: 05/19/23  Discharge Physician: [name]   PCP: [name], NP   Recommendations at discharge:    Blood sugar checks twice daily. ASA 81mg  daily + plavix 75mg  daily, DAPT for 90 days then monotherapy. LDL is at goal on repatha PT/OT follow up Ambulatory cardiac monitoring with primary [facility] cardiology suggested. Repeat MRI brain w/wo in 3 mos to assess stability of punctate focus of contrast enhancement in the R caudate head. Outpatient neurology f/u.  Discharge Diagnoses: Principal Problem:   UTI (urinary tract infection) Active Problems:   Weakness   Acute kidney injury superimposed on chronic kidney disease (HCC)   Chronic diastolic CHF (congestive heart failure) (HCC)   History of cancer of vulva   Essential hypertension   CAD S/P percutaneous coronary angioplasty   Anemia   Fall   Acute ischemic stroke [facility])  Resolved Problems:   * No resolved hospital problems. *  Hospital Course: Carly Cortez is a 82 y.o. female with medical history significant of vulvar cancer, stage III CKD, type 2 diabetes, hypertension, hyperlipidemia, HFpEF presenting with fall, weakness, UTI, acute on chronic kidney disease reports worsening weakness over the past 1 to 2 days. generalized malaise, decreased p.o. intake. dysuria and increased urinary frequency.  Had falls x 2 admitted to hospitalist service for further management of UTI, generalized weakness.  During hospital stay patient was started on IV Rocephin therapy, gentle IV hydration.  Patient was noted to have right-sided weakness and an MRI brain done which showed acute ischemic stroke involving left anterior cerebellar artery.  Neurologist evaluated the patient recommended stroke workup.  Echocardiogram with bubble study pending.  MRA head showed left P3 occlusion correlating with acute infarct,  MRA neck moderate narrowing of the origin of right ICA and right vertebral artery.  Patient is started on aspirin, Plavix which neurology recommended 90-day therapy given severe intracranial stenosis and occlusion.  She will need ambulatory cardiac monitoring, advised [facility] cardiology follow-up.  Neurologist also recommended 76-month MRI brain with and without contrast to assess stability of punctate focus of contrast-enhancement in right caudate head.  Outpatient neurology follow-up will be arranged.  PT OT evaluated the patient recommended skilled nursing facility placement.  I discussed with patient and her daughter at bedside who understand agree with the discharge plan.  Assessment and Plan: *Acute ischemic stroke  MRI brain showed intermediate sized area of acute ischemia within the left anterior cerebral artery territory.  Punctate focus of contrast-enhancement at the right caudate head, numerous foci of magnetic susceptibility along tentorium cerebelli possible sequela of remote hemorrhage.  MRA head- Left A3 occlusion correlating with the acute infarct. Generalized intracranial atherosclerosis without proximal and correctable stenosis.   MRA neck- Moderate narrowing of the origin of the right ICA and right vertebral artery. No evidence of occlusion.  HbA1c 6.7. LDL wnl. Echo with bubble study pending. Continue aspirin, plavix for 90 days and Aspirin thereafter. Continue Repatha. Ambulatory cardiac monitoring per [facility] cardiology follow-up. Neurology follow-up -recommended 37-month MRI brain with and without contrast to assess stability of punctate focus of contrast-enhancement in right caudate head.  Patient is hemodynamically stable to be discharged to skilled nursing facility.  E.coli UTI (urinary tract infection) Finished antibiotic therapy with Rocephin, later Keflex Urine culture grew E.coli, pansensitive   Generalized weakness Falls x 2 Due to acute stroke. PT/ OT follow up-  Physician Discharge Summary   Patient: Carly Cortez MRN: [id number] DOB: Jul 20, 1941  Admit date:     05/13/2023  Discharge date: 05/19/23  Discharge Physician: [name]   PCP: [name], NP   Recommendations at discharge:    Blood sugar checks twice daily. ASA 81mg  daily + plavix 75mg  daily, DAPT for 90 days then monotherapy. LDL is at goal on repatha PT/OT follow up Ambulatory cardiac monitoring with primary [facility] cardiology suggested. Repeat MRI brain w/wo in 3 mos to assess stability of punctate focus of contrast enhancement in the R caudate head. Outpatient neurology f/u.  Discharge Diagnoses: Principal Problem:   UTI (urinary tract infection) Active Problems:   Weakness   Acute kidney injury superimposed on chronic kidney disease (HCC)   Chronic diastolic CHF (congestive heart failure) (HCC)   History of cancer of vulva   Essential hypertension   CAD S/P percutaneous coronary angioplasty   Anemia   Fall   Acute ischemic stroke [facility])  Resolved Problems:   * No resolved hospital problems. *  Hospital Course: Carly Cortez is a 82 y.o. female with medical history significant of vulvar cancer, stage III CKD, type 2 diabetes, hypertension, hyperlipidemia, HFpEF presenting with fall, weakness, UTI, acute on chronic kidney disease reports worsening weakness over the past 1 to 2 days. generalized malaise, decreased p.o. intake. dysuria and increased urinary frequency.  Had falls x 2 admitted to hospitalist service for further management of UTI, generalized weakness.  During hospital stay patient was started on IV Rocephin therapy, gentle IV hydration.  Patient was noted to have right-sided weakness and an MRI brain done which showed acute ischemic stroke involving left anterior cerebellar artery.  Neurologist evaluated the patient recommended stroke workup.  Echocardiogram with bubble study pending.  MRA head showed left P3 occlusion correlating with acute infarct,  MRA neck moderate narrowing of the origin of right ICA and right vertebral artery.  Patient is started on aspirin, Plavix which neurology recommended 90-day therapy given severe intracranial stenosis and occlusion.  She will need ambulatory cardiac monitoring, advised [facility] cardiology follow-up.  Neurologist also recommended 76-month MRI brain with and without contrast to assess stability of punctate focus of contrast-enhancement in right caudate head.  Outpatient neurology follow-up will be arranged.  PT OT evaluated the patient recommended skilled nursing facility placement.  I discussed with patient and her daughter at bedside who understand agree with the discharge plan.  Assessment and Plan: *Acute ischemic stroke  MRI brain showed intermediate sized area of acute ischemia within the left anterior cerebral artery territory.  Punctate focus of contrast-enhancement at the right caudate head, numerous foci of magnetic susceptibility along tentorium cerebelli possible sequela of remote hemorrhage.  MRA head- Left A3 occlusion correlating with the acute infarct. Generalized intracranial atherosclerosis without proximal and correctable stenosis.   MRA neck- Moderate narrowing of the origin of the right ICA and right vertebral artery. No evidence of occlusion.  HbA1c 6.7. LDL wnl. Echo with bubble study pending. Continue aspirin, plavix for 90 days and Aspirin thereafter. Continue Repatha. Ambulatory cardiac monitoring per [facility] cardiology follow-up. Neurology follow-up -recommended 37-month MRI brain with and without contrast to assess stability of punctate focus of contrast-enhancement in right caudate head.  Patient is hemodynamically stable to be discharged to skilled nursing facility.  E.coli UTI (urinary tract infection) Finished antibiotic therapy with Rocephin, later Keflex Urine culture grew E.coli, pansensitive   Generalized weakness Falls x 2 Due to acute stroke. PT/ OT follow up-  TAKE AN ADDITIONAL 1 TABLET IN THE AFTERNOON IF WEIGHT IS UP 3 POUNDS OR MORE What changed: when to take this   Lantus SoloStar 100 UNIT/ML Solostar Pen Generic drug: insulin glargine Inject 20 Units into the skin at bedtime. What changed: how much to take   lidocaine 5 % ointment Commonly known as: XYLOCAINE Apply 1 Application topically daily as needed.   nitroGLYCERIN 0.4 MG SL tablet Commonly known as: NITROSTAT Place 0.4 mg under the tongue every 5 (five) minutes as needed for chest pain.    ondansetron 8 MG tablet Commonly known as: ZOFRAN Take 8 mg by mouth every 8 (eight) hours as needed.   pantoprazole 40 MG tablet Commonly known as: PROTONIX Take 40 mg by mouth daily.   polyethylene glycol 17 g packet Commonly known as: MIRALAX / GLYCOLAX Take 17 g by mouth daily.   pregabalin 75 MG capsule Commonly known as: LYRICA Take 1 capsule (75 mg total) by mouth 2 (two) times daily. What changed:  when to take this Another medication with the same name was removed. Continue taking this medication, and follow the directions you see here.   Repatha SureClick 140 MG/ML Soaj Generic drug: Evolocumab Inject 140 mg into the skin every 14 (fourteen) days.   senna 8.6 MG tablet Commonly known as: SENOKOT Take 1 tablet by mouth daily.        Contact information for after-discharge care     Destination     [facility] Preferred SNF .   Service: Skilled Nursing Contact information: [address] [place] [address] [phone]                    Discharge Exam: [name] Weights   05/13/23 1238  Weight: 88.5 kg   General - Elderly Caucasian ill looking female, lying in bed, in distress, grief. HEENT - PERRLA, EOMI, atraumatic head, non tender sinuses. Lung - Clear, diffuse rales Heart - S1, S2 heard, no murmurs, rubs, trace pedal edema. Abdomen- soft, non tender, bowel sounds good. Neuro - Alert, awake and oriented, right sided weakness 3/5 Skin - Warm and dry.  Condition at discharge: stable  The results of significant diagnostics from this hospitalization (including imaging, microbiology, ancillary and laboratory) are listed below for reference.   Imaging Studies: ECHOCARDIOGRAM COMPLETE  Result Date: 05/19/2023    ECHOCARDIOGRAM REPORT   Patient Name:   Carly Cortez Date of Exam: 05/19/2023 Medical Rec #:  [id number]     Height:       66.0 in Accession #:     [id number]    Weight:       195.0 lb Date of Birth:  Aug 15, 1941     BSA:          1.978 m Patient Age:    82 years      BP:           109/69 mmHg Patient Gender: F             HR:           76 bpm. Exam Location:  [facility] Procedure: 2D Echo, Cardiac Doppler and Saline Contrast Bubble Study Indications:     Stroke I63.9  History:         Patient has prior history of Echocardiogram examinations, most                  recent 04/13/2020. Risk Factors:Hypertension

## 2023-05-19 NOTE — TOC Transition Note (Addendum)
Transition of Care [facility]) - CM/SW Discharge Note   Patient Details  Name: Carly Cortez MRN: [id number] Date of Birth: 25-Mar-1941  [facility] CM/SW Contact:  Darolyn Rua, LCSW Phone Number: 05/19/2023, 10:05 AM   Clinical Narrative:     Patient will [id number] Anticipated DC date: 05/19/23 Transport by: Wendie Simmer  Per MD patient ready for DC to [facility] . RN, patient, patient's family, and facility notified of DC. [name] RN given number for report [phone]  Room 511 . DC packet on chart. Ambulance transport to be requested for patient pending dc summary.        Final next level of care: Skilled Nursing Facility Barriers to Discharge: No Barriers Identified   Patient Goals and CMS Choice      Discharge Placement                         Discharge Plan and Services Additional resources added to the After Visit Summary for     Discharge Planning Services: CM Consult            DME Arranged: N/A                    Social Determinants of Health (SDOH) Interventions SDOH Screenings   Food Insecurity: No Food Insecurity (04/06/2022)   Received from [facility], [facility]  Transportation Needs: No Transportation Needs (04/06/2022)   [facility], [facility]  Financial Resource Strain: Low Risk  (04/06/2022)   Received from [facility], [facility]  Physical Activity: Insufficiently Active (04/06/2022)   Received from [facility], [facility]  Social Connections: Socially Integrated (04/06/2022)   Received from [facility], [facility]  Stress: No Stress Concern Present (04/06/2022)   Received from [facility], [facility]  Tobacco Use: Low Risk  (05/13/2023)  Health Literacy: Low Risk  (04/06/2022)   Received from [facility], [facility]     Readmission Risk Interventions     No data to display

## 2023-05-19 NOTE — Care Management Important Message (Signed)
Important Message  Patient Details  Name: Carly Cortez MRN: [id number] Date of Birth: 06-25-1941   Medicare Important Message Given:  Yes     Johnell Comings 05/19/2023, 11:25 AM

## 2023-05-19 NOTE — Progress Notes (Signed)
*  PRELIMINARY RESULTS* Echocardiogram 2D Echocardiogram has been performed.  Cristela Blue 05/19/2023, 12:55 PM

## 2023-05-19 NOTE — Plan of Care (Signed)
Problem: Coping: Goal: Ability to adjust to condition or change in health will improve Outcome: Progressing   Problem: Nutritional: Goal: Maintenance of adequate nutrition will improve Outcome: Progressing   Problem: Skin Integrity: Goal: Risk for impaired skin integrity will decrease Outcome: Progressing

## 2023-05-22 ENCOUNTER — Encounter: Payer: Self-pay | Admitting: Emergency Medicine

## 2023-05-24 ENCOUNTER — Other Ambulatory Visit: Payer: Self-pay | Admitting: Emergency Medicine

## 2023-05-24 DIAGNOSIS — M25551 Pain in right hip: Secondary | ICD-10-CM

## 2023-05-28 LAB — BLOOD GAS, VENOUS
Acid-base deficit: 2 mmol/L (ref 0.0–2.0)
Bicarbonate: 23.7 mmol/L (ref 20.0–28.0)
O2 Saturation: 21.4 %
Patient temperature: 37
pCO2, Ven: 43 mmHg — ABNORMAL LOW (ref 44–60)
pH, Ven: 7.35 (ref 7.25–7.43)

## 2023-05-29 ENCOUNTER — Ambulatory Visit
Admission: RE | Admit: 2023-05-29 | Discharge: 2023-05-29 | Disposition: A | Discharge status: Home / Self Care | Payer: 59 | Source: Ambulatory Visit | Attending: Emergency Medicine | Admitting: Emergency Medicine

## 2023-05-29 DIAGNOSIS — M25551 Pain in right hip: Secondary | ICD-10-CM | POA: Insufficient documentation

## 2023-08-03 ENCOUNTER — Other Ambulatory Visit: Payer: Self-pay

## 2023-08-03 ENCOUNTER — Emergency Department
Admission: EM | Admit: 2023-08-03 | Discharge: 2023-08-03 | Disposition: A | Discharge status: Home / Self Care | Payer: 59 | Attending: Emergency Medicine | Admitting: Emergency Medicine

## 2023-08-03 DIAGNOSIS — M25532 Pain in left wrist: Secondary | ICD-10-CM | POA: Diagnosis present

## 2023-08-03 DIAGNOSIS — N1832 Chronic kidney disease, stage 3b: Secondary | ICD-10-CM | POA: Diagnosis not present

## 2023-08-03 DIAGNOSIS — Z853 Personal history of malignant neoplasm of breast: Secondary | ICD-10-CM | POA: Diagnosis not present

## 2023-08-03 DIAGNOSIS — E1122 Type 2 diabetes mellitus with diabetic chronic kidney disease: Secondary | ICD-10-CM | POA: Diagnosis not present

## 2023-08-03 DIAGNOSIS — I129 Hypertensive chronic kidney disease with stage 1 through stage 4 chronic kidney disease, or unspecified chronic kidney disease: Secondary | ICD-10-CM | POA: Diagnosis not present

## 2023-08-03 DIAGNOSIS — M10032 Idiopathic gout, left wrist: Secondary | ICD-10-CM | POA: Diagnosis not present

## 2023-08-03 LAB — COMPREHENSIVE METABOLIC PANEL
ALT: 9 U/L (ref 0–44)
AST: 25 U/L (ref 15–41)
Albumin: 3.2 g/dL — ABNORMAL LOW (ref 3.5–5.0)
Alkaline Phosphatase: 94 U/L (ref 38–126)
Anion gap: 10 (ref 5–15)
BUN: 32 mg/dL — ABNORMAL HIGH (ref 8–23)
CO2: 21 mmol/L — ABNORMAL LOW (ref 22–32)
Calcium: 9.1 mg/dL (ref 8.9–10.3)
Chloride: 104 mmol/L (ref 98–111)
Creatinine, Ser: 1.58 mg/dL — ABNORMAL HIGH (ref 0.44–1.00)
GFR, Estimated: 33 mL/min — ABNORMAL LOW (ref >60)
Glucose, Bld: 143 mg/dL — ABNORMAL HIGH (ref 70–99)
Potassium: 3.8 mmol/L (ref 3.5–5.1)
Sodium: 135 mmol/L (ref 135–145)
Total Bilirubin: 0.7 mg/dL (ref <1.2)
Total Protein: 6.8 g/dL (ref 6.5–8.1)

## 2023-08-03 LAB — CBC
HCT: 41.7 % (ref 36.0–46.0)
Hemoglobin: 13.6 g/dL (ref 12.0–15.0)
MCH: 29.7 pg (ref 26.0–34.0)
MCHC: 32.6 g/dL (ref 30.0–36.0)
MCV: 91 fL (ref 80.0–100.0)
Platelets: 276 10*3/uL (ref 150–400)
RBC: 4.58 MIL/uL (ref 3.87–5.11)
RDW: 15 % (ref 11.5–15.5)
WBC: 9.3 10*3/uL (ref 4.0–10.5)
nRBC: 0 % (ref 0.0–0.2)

## 2023-08-03 MED ORDER — OXYCODONE-ACETAMINOPHEN 5-325 MG PO TABS
1.0000 | ORAL_TABLET | Freq: Once | ORAL | Status: AC
  Administered 2023-08-03: 1 via ORAL
  Filled 2023-08-03: qty 1

## 2023-08-03 MED ORDER — PREDNISONE 10 MG PO TABS: ORAL_TABLET | ORAL | 0 refills | Status: AC

## 2023-08-03 MED ORDER — OXYCODONE-ACETAMINOPHEN 5-325 MG PO TABS
1.0000 | ORAL_TABLET | Freq: Four times a day (QID) | ORAL | 0 refills | Status: AC | PRN
Start: 2023-08-03 — End: 2023-08-06

## 2023-08-03 MED ORDER — PREDNISONE 20 MG PO TABS
40.0000 mg | ORAL_TABLET | Freq: Once | ORAL | Status: AC
  Administered 2023-08-03: 40 mg via ORAL
  Filled 2023-08-03: qty 2

## 2023-08-03 NOTE — ED Notes (Signed)
Pt brief changed from urine incontinence. Peri care provided. Paper scrub pants provided

## 2023-08-03 NOTE — ED Notes (Signed)
Pt titrated to RA by Dr Scotty Court

## 2023-08-03 NOTE — ED Triage Notes (Signed)
Pt states coming in with left wrist swelling that started this AM. Pt reports no trauma to the wrist. Pt states recent diagnosis of stage 3 breast cancer. Family states blood work was done yesterday at [facility].  Pt placed on 2lpm of oxygen in triage.

## 2023-08-03 NOTE — ED Provider Notes (Signed)
[facility] Provider Note    Event Date/Time   First MD Initiated Contact with Patient 08/03/23 1118     (approximate)   History   Chief Complaint: Joint Swelling (Left wrist)   HPI  Carly Cortez is a 82 y.o. female with a history of hypertension, diabetes who comes ED complaining of left wrist swelling and pain that she noticed this morning.  Everything was normal when she went to bed last night.  Feels like prior episodes of gout.  No fever, no trauma.  No chest pain or shortness of breath.          Physical Exam   Triage Vital Signs: ED Triage Vitals  Encounter Vitals Group     BP 08/03/23 0927 (!) 104/58     Systolic BP Percentile --      Diastolic BP Percentile --      Pulse Rate 08/03/23 0927 76     Resp 08/03/23 0927 20     Temp 08/03/23 0927 98.5 F (36.9 C)     Temp src --      SpO2 08/03/23 0927 (!) 87 %     Weight 08/03/23 0929 186 lb (84.4 kg)     Height 08/03/23 0929 5\' 6"  (1.676 m)     Head Circumference --      Peak Flow --      Pain Score 08/03/23 0929 8     Pain Loc --      Pain Education --      Exclude from Growth Chart --     Most recent vital signs: Vitals:   08/03/23 1026 08/03/23 1300  BP: 130/74 109/74  Pulse: 71 66  Resp: (!) 21   Temp:    SpO2: 95% 94%    General: Awake, no distress.  CV:  Good peripheral perfusion.  Normal distal pulses. Resp:  Normal effort.  Abd:  No distention.  Other:  Left wrist with erythema swelling and tenderness.  No crepitus, induration, drainage or wounds.  No deformity.  Able to tolerate passive range of motion.   ED Results / Procedures / Treatments   Labs (all labs ordered are listed, but only abnormal results are displayed) Labs Reviewed  COMPREHENSIVE METABOLIC PANEL - Abnormal; Notable for the following components:      Result Value   CO2 21 (*)    Glucose, Bld 143 (*)    BUN 32 (*)    Creatinine, Ser 1.58 (*)    Albumin 3.2 (*)    GFR, Estimated 33 (*)     All other components within normal limits  CBC     EKG    RADIOLOGY    PROCEDURES:  Procedures   MEDICATIONS ORDERED IN ED: Medications  oxyCODONE-acetaminophen (PERCOCET/ROXICET) 5-325 MG per tablet 1 tablet (1 tablet Oral Given 08/03/23 1301)  predniSONE (DELTASONE) tablet 40 mg (40 mg Oral Given 08/03/23 1301)     IMPRESSION / MDM / ASSESSMENT AND PLAN / ED COURSE  I reviewed the triage vital signs and the nursing notes.    Patient's presentation is most consistent with severe exacerbation of chronic illness.  Patient with a history of idiopathic gout comes to the ED with monoarticular pain and swelling.  Doubt septic arthritis or fracture or dislocation.  Labs are normal, stable CKD 3B, normal vitals.  Unable to tolerate NSAIDs or colchicine.  Will give Percocet and prednisone.       FINAL CLINICAL IMPRESSION(S) / ED DIAGNOSES  Final diagnoses:  Acute idiopathic gout of left wrist     Rx / DC Orders   ED Discharge Orders          Ordered    predniSONE (DELTASONE) 10 MG tablet  Daily        08/03/23 1317    oxyCODONE-acetaminophen (PERCOCET) 5-325 MG tablet  Every 6 hours PRN        08/03/23 1317             Note:  This document was prepared using Dragon voice recognition software and may include unintentional dictation errors.   Sharman Cheek, MD 08/03/23 1323

## 2023-08-03 NOTE — ED Notes (Signed)
See triage note. Pt reports left wrist pain. Redness and swelling noted. Denies known injuries. Reports hx gout.  Recent dx of breast cancer, has not yet started treatment. Pt currently 97% on 2 L [place]. States does not wear O2 at home, placed on O2 in triage.  Denies shob, n/v, chest pain, fevers.

## 2024-01-04 DEATH — deceased
